# Patient Record
Sex: Female | Born: 1965 | Race: Black or African American | Hispanic: No | Marital: Married | Smoking: Never smoker
Health system: Southern US, Community
[De-identification: ages and names within clinical notes are randomized; demographics above are authoritative.]

## PROBLEM LIST (undated history)

## (undated) DIAGNOSIS — G459 Transient cerebral ischemic attack, unspecified: Secondary | ICD-10-CM

## (undated) DIAGNOSIS — H5440 Blindness, one eye, unspecified eye: Secondary | ICD-10-CM

## (undated) DIAGNOSIS — I639 Cerebral infarction, unspecified: Secondary | ICD-10-CM

## (undated) DIAGNOSIS — D860 Sarcoidosis of lung: Secondary | ICD-10-CM

## (undated) DIAGNOSIS — F32A Depression, unspecified: Secondary | ICD-10-CM

## (undated) DIAGNOSIS — F329 Major depressive disorder, single episode, unspecified: Secondary | ICD-10-CM

## (undated) DIAGNOSIS — M1990 Unspecified osteoarthritis, unspecified site: Secondary | ICD-10-CM

## (undated) DIAGNOSIS — K219 Gastro-esophageal reflux disease without esophagitis: Secondary | ICD-10-CM

## (undated) DIAGNOSIS — I10 Essential (primary) hypertension: Secondary | ICD-10-CM

## (undated) DIAGNOSIS — J189 Pneumonia, unspecified organism: Secondary | ICD-10-CM

## (undated) HISTORY — PX: EYE SURGERY: SHX253

## (undated) HISTORY — PX: FOOT SURGERY: SHX648

## (undated) HISTORY — PX: APPENDECTOMY: SHX54

## (undated) HISTORY — DX: Cerebral infarction, unspecified: I63.9

---

## 1996-09-23 HISTORY — PX: TUBAL LIGATION: SHX77

## 2004-01-12 ENCOUNTER — Emergency Department (HOSPITAL_COMMUNITY): Admission: EM | Admit: 2004-01-12 | Discharge: 2004-01-12 | Payer: Self-pay | Admitting: Emergency Medicine

## 2005-12-18 ENCOUNTER — Ambulatory Visit (HOSPITAL_BASED_OUTPATIENT_CLINIC_OR_DEPARTMENT_OTHER): Admission: RE | Admit: 2005-12-18 | Discharge: 2005-12-18 | Payer: Self-pay | Admitting: Ophthalmology

## 2008-04-25 ENCOUNTER — Observation Stay (HOSPITAL_COMMUNITY): Admission: EM | Admit: 2008-04-25 | Discharge: 2008-04-26 | Payer: Self-pay | Admitting: Emergency Medicine

## 2008-04-25 ENCOUNTER — Encounter (INDEPENDENT_AMBULATORY_CARE_PROVIDER_SITE_OTHER): Payer: Self-pay | Admitting: General Surgery

## 2011-02-05 NOTE — H&P (Signed)
NAMEKARISMA, MEISER            ACCOUNT NO.:  [id number]   MEDICAL RECORD NO.:  [id number]          PATIENT TYPE:  INP   LOCATION:  5127                         FACILITY:  [facility]   PHYSICIAN:  Ollen Gross. Vernell Morgans, M.D. DATE OF BIRTH:  [birth year]   DATE OF ADMISSION:  04/25/2008  DATE OF DISCHARGE:                              HISTORY & PHYSICAL   Ms. Zinger is a 45 year old black female who presents to the emergency  department with right lower quadrant pain that started yesterday.  Pain  is gotten more severe over the last 24 hours.  The pain has been  associated with nausea and vomiting.  She denies any fevers or chills.  No chest pain or shortness of breath.  No diarrhea or dysuria.  The rest  of her review of systems is unremarkable.   PAST MEDICAL HISTORY:  Significant for hypertension.   PAST SURGICAL HISTORY:  Significant for a C-section and left eye  surgery.   MEDICATIONS:  Lisinopril and hydrochlorothiazide.   ALLERGIES:  No known drug allergies.   SOCIAL HISTORY:  She denies any tobacco or tobacco products.   FAMILY HISTORY:  Noncontributory.   PHYSICAL EXAMINATION:  VITAL SIGNS:  Her temp is 97.7, blood pressure  103/64, and pulse 72.  GENERAL:  She is a well-developed, well-nourished black female, in no  acute distress.  SKIN:  Warm and dry.  No jaundice.  HEENT:  Eyes are anicteric.  Extraocular movements are intact.  Pupils  are equal, round, and reactive to light.  Sclerae nonicteric.  LUNGS:  Clear bilaterally with no use of accessory respiratory muscles.  HEART:  Regular rate and rhythm with impulse in the left chest.  ABDOMEN:  Soft with focal right lower quadrant tenderness with guarding.  No palpable mass or hepatosplenomegaly.  EXTREMITIES:  No cyanosis, clubbing, or edema.  Good strength in arms  and legs.  PSYCHOLOGICALLY:  She is alert and oriented x3 with no evidence today of  anxiety or depression.   On review of her lab work, is significant  for an elevated white count of  14.9.  CT scan was reviewed with the radiologist and did show an  enlarged inflamed appendix consistent with appendicitis.   ASSESSMENT AND PLAN:  This is a 45 year old black female with what  appears to be acute appendicitis.  Because of the risk of perforation  and sepsis, I believe she would benefit from appendectomy tonight.  I  have  discussed with her in detail the risks and benefits of the operation to  remove the appendix as well some other technical aspects and she  understands and wishes to proceed.  We will obtain routine preoperative  lab work and preparation in doing this for tonight.      Ollen Gross. Vernell Morgans, M.D.  Electronically Signed     PST/MEDQ  D:  04/25/2008  T:  04/26/2008  Job:  [id number]

## 2011-02-05 NOTE — Op Note (Signed)
Hannah Ellis, Hannah Ellis            ACCOUNT NO.:  [id number]   MEDICAL RECORD NO.:  [id number]          PATIENT TYPE:  INP   LOCATION:  2550                         FACILITY:  [facility]   PHYSICIAN:  Ollen Gross. Vernell Morgans, M.D. DATE OF BIRTH:  1966-05-17   DATE OF PROCEDURE:  04/25/2008  DATE OF DISCHARGE:                               OPERATIVE REPORT   PREOPERATIVE DIAGNOSIS:  Appendicitis.   POSTOPERATIVE DIAGNOSIS:  Appendicitis.   PROCEDURE:  Laparoscopic appendectomy.   SURGEON:  Ollen Gross. Vernell Morgans, MD   ANESTHESIA:  General endotracheal.   PROCEDURE:  After informed consent was obtained, the patient was brought  to the operating room and placed in supine position on the operating  room table.  After adequate induction of general anesthesia, the  patient's abdomen was prepped with Betadine and draped in usual sterile  manner.  The area below the umbilicus was infiltrated with 0.25%  Marcaine.  A small incision was made with a 15 blade knife.  This  incision was carried down through the subcutaneous tissue bluntly with a  hemostat and [employer] retractors until the linea alba was identified.  The linea alba was incised with a 15 blade knife and each side was  grasped Kocher clamps and elevated anteriorly.  The preperitoneal space  was then probed bluntly with a hemostat until the peritoneum was opened  and access was gained to the abdominal cavity.  A 0-Vicryl pursestring  stitch was placed in the fascia around the opening and Hasson cannula  was placed through the opening and anchored in place at the previous  with 0 Vicryl pursestring stitch.  The abdomen was then insufflated with  carbon dioxide without difficulty.  The patient was placed in  Trendelenburg position and rotated with the right side up.  Next, the  area above the umbilicus was infiltrated with 0.25% Marcaine.  A small  stab incision was made with 15 blade knife and a 5-mm port was placed  bluntly through this incision  into the abdominal cavity under direct  vision.  The abdomen was then inspected in the pelvis.  The uterus  appeared to have a small fibroid on the dome and the right ovary  appeared cystic with some scar tissue around the fimbria.  Pictures were  taken both of these, but there was no obvious inflammation in this area.  The right colon was then evaluated and the appendix was identified.  The  appendix did appear to be enlarged and inflamed.  We were able to  mobilize the appendix by incising its retroperitoneal attachment along  the gutter with the harmonic scalpel.  This allowed [place] to elevate the  mesoappendix.  Prior to doing this, the suprapubic area was infiltrated  with 0.25% Marcaine.  A small incision was made with a 15 blade knife  and 11-mm port was placed bluntly through this incision into the  abdominal cavity under direct vision.  Camera was then moved to the  suprapubic port.  Using the harmonic scalpel and Glassman grasper, the  appendix was then elevated.  The mesoappendix was taken down also  sharply  with the harmonic scalpel.  Once this was accomplished, we were  able to identify the base of the appendix at its junction with the cecum  and cleared of any tissue.  A laparoscopic 45 blue load six-row stapling  device was then placed through the Hasson cannula.  The appendix was  elevated.  It was placed across the base of the appendix at its junction  with the cecum clamped and fired thereby dividing the base the appendix  between staple lines.  A laparoscopic bag was then inserted through the  Hasson cannula and the appendix was placed in the bag and the bag was  sealed.  The end was then irrigated with copious amounts of saline.  The  staple line was examined and found to be hemostatic.  The bag and  appendix were removed through the infraumbilical port without  difficulty.  The fascial defect was then closed with the previously  placed Vicryl pursestring stitch as well as  with another figure-of-eight  0 Vicryl stitch.  The rest of ports were removed under direct vision and  found to be hemostatic.  The gas was allowed  to escape.  The skin incisions were all closed with interrupted 4-0  Monocryl subcuticular stitches.  Dermabond dressings were applied.  The  patient tolerated the procedure well.  At the end of the case, all  needle, sponge, and instrument counts were correct.  The patient was  then awakened and taken to recovery room in stable condition.      Ollen Gross. Vernell Morgans, M.D.  Electronically Signed     PST/MEDQ  D:  04/25/2008  T:  04/26/2008  Job:  [id number]

## 2011-02-08 NOTE — Op Note (Signed)
Hannah Ellis, Hannah Ellis            ACCOUNT NO.:  [id number]   MEDICAL RECORD NO.:  [id number]          PATIENT TYPE:  AMB   LOCATION:  [facility]                         [facility]ACILITY:  [facility]   PHYSICIAN:  Tyrone Apple. 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  A right lateral rectus recession  10 mm.   ANESTHESIA:  General with laryngeal mask airway.   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  This procedure is indicated to restore  alignment of the visual axis and improved single binocular vision.  The  risks and benefits of the procedure were  explained to the patient, prior to  procedure, informed consent was obtained.   DESCRIPTION OF TECHNIQUE:  The patient was taken to the operating room,  placed in a supine position.  The entire face was prepped and draped in the  usual sterile manner.  Our attention is first turned to the right eye.  Forced ductions were performed and found to be negative.  The globe was then  held in the inferior temporal quarter.  The eye was elevated and abducted.  An incision was made through the inferior temporal fornix, taken down to the  posterior subtenon space, and the right lateral rectus was isolated on a  Stevens hook, subsequently on a Green hook.  A second Green hook was then  passed beneath the tendon of the muscle.  Thiswas used to hold the globe in  an  elevated and abducted position.  The right lateral rectus was then  carefully dissected free from its overlying muscle  fascia and intermuscular  septae cut, and the tendon was then imbricated on 6-0 Vicryl suture taking 2  locking bites at the ends.  It was then recessed exactly 10 mm and  reattached to the globe using the preplaced sutures.  The conjunctiva was  repositioned.  Our attention is then turned to the left eye.  The globe was  held in the inferior nasal quadrant, and the eye was elevated and abducted.  An incision was made through the inferior nasal fornix, taken down to the  posterior subtenon space, and the left medial rectus muscle was then  isolated on a Stevens hook and subsequently on a Green hook.  A second Green  hook was passed beneath the tendon of the muscle, and this was used to hold  the globe in an elevated and abducted position.  Next, with careful  dissection, the left medial rectus muscle was carefully freed from its  overlying muscle fascia and intermuscular septum for a distance of  approximately 10 mm.  A second Green hook was then placed beneath the tendon  of  the left medial rectus muscle, and a mark was placed on the tendon at 8  mm from its insertion.  The tendon was then carefully imbricated on 6-0  Vicryl suture of the preplaced mark.  It was then transected proximal to the  preplaced suture, and the transected tendon was advanced to the native  insertion site with the preplaced sutures.  The muscle had been previously  resected.  It was found at approximately 7 mm from the limbus, and it was  advanced to 5.5 mm in addition to the resection.  Exuberant scar tissue was  encountered during the process of dissecting freeing the tendon, and this  was carefully dealt with.  Next, our attention was turned to the left  lateral rectus muscle.  The globe was held in the inferior temporal  quadrant, and the eye was elevated and adducted.  An incision was made  through the inferior temporal fornix, taken down to the posterior subtenons  space, and the left lateral rectus muscle  was carefully isolated on a  Stevens hook, subsequently on a Green hook.  A second Green hook was passed  beneath of the tendon of the muscle, and this was used to hole the globein  an elevatedan and adducted position.  The lateral rectus tendon was then  imbricated on 6-0 Vicryl suture, taking 2 locking bites at the end.  It was  then detached from the globe and recessed exactly 10 mm from its insertion.  It had been previously recessed, and it was advanced to its native  insertion.  It was reattached to its native insertion site with the  preplaced sutures and placed on adjustable suture fashion.  A 10 mm  recession was completed with the adjustable sutures.  The sutures were tied  securely, and the conjunctiva was repositioned.  A double pressure patch was  applied, at the conclusion of the procedure.  Antibiotic ointment was  instilled in inferior fornices of the right eye.  The patient was  subsequently adjusted to straight on the Hirschberg in the minor surgery  room, 1 hour post the completion of the procedure. There were no apparant  complications .      Casimiro Needle A. Karleen Hampshire, M.D.  Electronically Signed     MAS/MEDQ  D:  12/18/2005  T:  12/19/2005  Job:  [id number]

## 2011-06-21 LAB — BASIC METABOLIC PANEL
CO2: 29
Calcium: 9.3
Creatinine, Ser: 0.86
GFR calc Af Amer: >60
Sodium: 135

## 2011-06-21 LAB — CBC
HCT: 38.8
Hemoglobin: 13
MCHC: 33.5
MCV: 79.3
Platelets: 163
RBC: 4.89
RDW: 15.7 — ABNORMAL HIGH
WBC: 14.9 — ABNORMAL HIGH

## 2011-06-21 LAB — URINALYSIS, ROUTINE W REFLEX MICROSCOPIC
Bilirubin Urine: NEGATIVE
Hgb urine dipstick: NEGATIVE
Nitrite: NEGATIVE
Specific Gravity, Urine: 1.014
Urobilinogen, UA: 1
pH: 8

## 2011-06-21 LAB — DIFFERENTIAL
Basophils Absolute: 0.1
Basophils Relative: 0
Eosinophils Absolute: 0.1
Eosinophils Relative: 1
Lymphocytes Relative: 10 — ABNORMAL LOW
Lymphs Abs: 1.4
Monocytes Absolute: 1
Monocytes Relative: 7
Neutro Abs: 12.3 — ABNORMAL HIGH
Neutrophils Relative %: 83 — ABNORMAL HIGH

## 2011-06-21 LAB — PREGNANCY, URINE: Preg Test, Ur: NEGATIVE

## 2011-09-30 ENCOUNTER — Encounter: Payer: Self-pay | Admitting: *Deleted

## 2011-09-30 ENCOUNTER — Emergency Department (HOSPITAL_BASED_OUTPATIENT_CLINIC_OR_DEPARTMENT_OTHER)
Admission: EM | Admit: 2011-09-30 | Discharge: 2011-09-30 | Disposition: A | Discharge status: Home / Self Care | Payer: 59 | Attending: Emergency Medicine | Admitting: Emergency Medicine

## 2011-09-30 ENCOUNTER — Emergency Department (INDEPENDENT_AMBULATORY_CARE_PROVIDER_SITE_OTHER): Payer: 59

## 2011-09-30 DIAGNOSIS — R51 Headache: Secondary | ICD-10-CM

## 2011-09-30 DIAGNOSIS — I160 Hypertensive urgency: Secondary | ICD-10-CM

## 2011-09-30 DIAGNOSIS — I10 Essential (primary) hypertension: Secondary | ICD-10-CM | POA: Insufficient documentation

## 2011-09-30 HISTORY — DX: Essential (primary) hypertension: I10

## 2011-09-30 LAB — URINALYSIS, ROUTINE W REFLEX MICROSCOPIC
Bilirubin Urine: NEGATIVE
Glucose, UA: NEGATIVE mg/dL
Hgb urine dipstick: NEGATIVE
Specific Gravity, Urine: 1.018 (ref 1.005–1.030)
Urobilinogen, UA: 0.2 mg/dL (ref 0.0–1.0)
pH: 7 (ref 5.0–8.0)

## 2011-09-30 MED ORDER — AMLODIPINE BESYLATE 10 MG PO TABS: 10.0000 mg | ORAL_TABLET | Freq: Every day | ORAL | Status: DC

## 2011-09-30 MED ORDER — LISINOPRIL-HYDROCHLOROTHIAZIDE 10-12.5 MG PO TABS: 2.0000 | ORAL_TABLET | Freq: Every day | ORAL | Status: DC

## 2011-09-30 MED ORDER — METOPROLOL TARTRATE 50 MG PO TABS
50.0000 mg | ORAL_TABLET | Freq: Once | ORAL | Status: AC
  Administered 2011-09-30: 50 mg via ORAL

## 2011-09-30 MED ORDER — METOPROLOL TARTRATE 50 MG PO TABS: 25.0000 mg | ORAL_TABLET | Freq: Two times a day (BID) | ORAL | Status: DC

## 2011-09-30 MED ORDER — KETOROLAC TROMETHAMINE 60 MG/2ML IM SOLN
60.0000 mg | Freq: Once | INTRAMUSCULAR | Status: AC
  Administered 2011-09-30: 60 mg via INTRAMUSCULAR
  Filled 2011-09-30: qty 2

## 2011-09-30 MED ORDER — LABETALOL HCL 100 MG PO TABS
100.0000 mg | ORAL_TABLET | Freq: Once | ORAL | Status: DC
  Filled 2011-09-30: qty 1

## 2011-09-30 MED ORDER — METOPROLOL TARTRATE 50 MG PO TABS
ORAL_TABLET | ORAL | Status: AC
  Administered 2011-09-30: 50 mg via ORAL
  Filled 2011-09-30: qty 1

## 2011-09-30 NOTE — ED Notes (Signed)
Pt ambulated to restroom and back to stretcher without difficulty, steady gait observed.

## 2011-09-30 NOTE — ED Provider Notes (Signed)
History   This chart was scribed for Geoffery Lyons, MD by Melba Coon. The patient was seen in room MH04/MH04 and the patient's care was started at 4:30PM.    CSN: [id number]  Arrival date & time 09/30/11  1512   First MD Initiated Contact with Patient 09/30/11 1634      Chief Complaint  Patient presents with  . Headache    (Consider location/radiation/quality/duration/timing/severity/associated sxs/prior treatment) HPI Hannah Ellis is a 46 y.o. female who presents to the Emergency Department complaining of constant, moderate to severe HA with associated HTN with an onset 2-3 days ago. 2-3 days ago, pt stopped taking HTN meds and pain started. Pain was at its worst yesterday and prompted pt to present to the hospital 2 hrs ago. HA frontal and posterior. Dr told pt to come to ED when high BP was noticed (230/130). Prior to PE, BP was 164/112. Pt is similar to light. No fever, n/v/d, or trouble with eyesight. Pt states she had previous similar episode 14 yrs ago.   Past Medical History  Diagnosis Date  . Hypertension     Past Surgical History  Procedure Date  . Eye surgery   . Foot surgery   . Appendectomy   . Tubal ligaton     No family history on file.  History  Substance Use Topics  . Smoking status: Never Smoker   . Smokeless tobacco: Not on file  . Alcohol Use: No    OB History    Grav Para Term Preterm Abortions TAB SAB Ect Mult Living                  Review of Systems 10 Systems reviewed and are negative for acute change except as noted in the HPI.  Allergies  Review of patient's allergies indicates no known allergies.  Home Medications   Current Outpatient Rx  Name Route Sig Dispense Refill  . AMLODIPINE BESYLATE 10 MG PO TABS Oral Take 10 mg by mouth daily.      Marland Kitchen LISINOPRIL-HYDROCHLOROTHIAZIDE 20-25 MG PO TABS Oral Take 1 tablet by mouth daily.      Marland Kitchen METOPROLOL TARTRATE 25 MG PO TABS Oral Take 25 mg by mouth 2 (two) times daily.        BP  130/82  Pulse 65  Temp(Src) 97.9 F (36.6 C) (Oral)  Resp 16  SpO2 100%  Physical Exam  Nursing note and vitals reviewed. Constitutional: She is oriented to person, place, and time. She appears well-developed and well-nourished. No distress.  HENT:  Head: Normocephalic and atraumatic.  Eyes: Conjunctivae and EOM are normal. Pupils are equal, round, and reactive to light.  Neck: Normal range of motion. Neck supple. No tracheal deviation present.  Cardiovascular: Normal rate, regular rhythm and normal heart sounds.  Exam reveals no gallop and no friction rub.   No murmur heard. Pulmonary/Chest: Effort normal and breath sounds normal. No respiratory distress. She has no wheezes. She has no rales.  Abdominal: Soft. She exhibits no distension.  Musculoskeletal: Normal range of motion. She exhibits no edema.  Neurological: She is alert and oriented to person, place, and time. No sensory deficit.  Skin: Skin is warm and dry. No rash noted.  Psychiatric: She has a normal mood and affect. Her behavior is normal.    ED Course  Procedures (including critical care time)  DIAGNOSTIC STUDIES: Oxygen Saturation is 100% on room air, normal by my interpretation.    COORDINATION OF CARE:     Labs  Reviewed  URINALYSIS, ROUTINE W REFLEX MICROSCOPIC - Abnormal; Notable for the following:    Ketones, ur 15 (*)    All other components within normal limits   Ct Head Wo Contrast  09/30/2011  *RADIOLOGY REPORT*  Clinical Data: Headache  CT HEAD WITHOUT CONTRAST  Technique:  Contiguous axial images were obtained from the base of the skull through the vertex without contrast.  Comparison: None  Findings: No acute intracranial hemorrhage.  No focal mass lesion. No CT evidence of acute infarction.   No midline shift or mass effect.  No hydrocephalus.  Basilar cisterns are patent. Paranasal sinuses and mastoid air cells are clear.  Orbits are normal.  IMPRESSION: No acute intracranial findings.  Original  Report Authenticated By: Genevive Bi, M.D.     No diagnosis found.    MDM  Patient feeling better with meds, ct is okay.  BP is much improved.  Will discharge to home.    I personally performed the services described in this documentation, which was scribed in my presence. The recorded information has been reviewed and considered.         Geoffery Lyons, MD 09/30/11 [phone]

## 2011-09-30 NOTE — ED Notes (Signed)
To Ed via ems c.o headache and high bp. Has not been taking her BP medication over the weekend. Planned to see her MD this afternoon for BP medication refil but the head pain got to bad to wait.

## 2014-11-15 ENCOUNTER — Emergency Department (HOSPITAL_COMMUNITY): Payer: BLUE CROSS/BLUE SHIELD

## 2014-11-15 ENCOUNTER — Encounter (HOSPITAL_COMMUNITY): Payer: Self-pay

## 2014-11-15 ENCOUNTER — Emergency Department (HOSPITAL_COMMUNITY)
Admission: EM | Admit: 2014-11-15 | Discharge: 2014-11-15 | Disposition: A | Discharge status: Home / Self Care | Payer: BLUE CROSS/BLUE SHIELD | Attending: Emergency Medicine | Admitting: Emergency Medicine

## 2014-11-15 DIAGNOSIS — I10 Essential (primary) hypertension: Secondary | ICD-10-CM | POA: Insufficient documentation

## 2014-11-15 DIAGNOSIS — R079 Chest pain, unspecified: Secondary | ICD-10-CM | POA: Diagnosis present

## 2014-11-15 DIAGNOSIS — Z79899 Other long term (current) drug therapy: Secondary | ICD-10-CM | POA: Diagnosis not present

## 2014-11-15 LAB — CBC WITH DIFFERENTIAL/PLATELET
BASOS ABS: 0 10*3/uL (ref 0.0–0.1)
BASOS PCT: 0 % (ref 0–1)
Eosinophils Absolute: 0.1 10*3/uL (ref 0.0–0.7)
Eosinophils Relative: 1 % (ref 0–5)
HCT: 33.7 % — ABNORMAL LOW (ref 36.0–46.0)
HEMOGLOBIN: 10.4 g/dL — AB (ref 12.0–15.0)
LYMPHS PCT: 32 % (ref 12–46)
Lymphs Abs: 2 10*3/uL (ref 0.7–4.0)
MCH: 22.9 pg — ABNORMAL LOW (ref 26.0–34.0)
MCHC: 30.9 g/dL (ref 30.0–36.0)
MCV: 74.1 fL — ABNORMAL LOW (ref 78.0–100.0)
MONOS PCT: 4 % (ref 3–12)
Monocytes Absolute: 0.2 10*3/uL (ref 0.1–1.0)
NEUTROS ABS: 3.9 10*3/uL (ref 1.7–7.7)
NEUTROS PCT: 63 % (ref 43–77)
Platelets: 196 10*3/uL (ref 150–400)
RBC: 4.55 MIL/uL (ref 3.87–5.11)
RDW: 17.3 % — AB (ref 11.5–15.5)
WBC: 6.2 10*3/uL (ref 4.0–10.5)

## 2014-11-15 LAB — BASIC METABOLIC PANEL
Anion gap: 4 — ABNORMAL LOW (ref 5–15)
BUN: 9 mg/dL (ref 6–23)
CO2: 27 mmol/L (ref 19–32)
CREATININE: 0.75 mg/dL (ref 0.50–1.10)
Calcium: 8.7 mg/dL (ref 8.4–10.5)
Chloride: 105 mmol/L (ref 96–112)
GLUCOSE: 94 mg/dL (ref 70–99)
POTASSIUM: 3.5 mmol/L (ref 3.5–5.1)
Sodium: 136 mmol/L (ref 135–145)

## 2014-11-15 LAB — I-STAT TROPONIN, ED: TROPONIN I, POC: 0 ng/mL (ref 0.00–0.08)

## 2014-11-15 MED ORDER — OMEPRAZOLE 20 MG PO CPDR: 20.0000 mg | DELAYED_RELEASE_CAPSULE | Freq: Every day | ORAL | Status: DC

## 2014-11-15 MED ORDER — METOPROLOL TARTRATE 25 MG PO TABS: 25.0000 mg | ORAL_TABLET | Freq: Two times a day (BID) | ORAL | Status: DC

## 2014-11-15 MED ORDER — NAPROXEN 500 MG PO TABS: 500.0000 mg | ORAL_TABLET | Freq: Two times a day (BID) | ORAL | Status: DC

## 2014-11-15 MED ORDER — LISINOPRIL-HYDROCHLOROTHIAZIDE 20-25 MG PO TABS: 1.0000 | ORAL_TABLET | Freq: Every day | ORAL | Status: DC

## 2014-11-15 MED ORDER — GI COCKTAIL ~~LOC~~
30.0000 mL | Freq: Once | ORAL | Status: AC
  Administered 2014-11-15: 30 mL via ORAL
  Filled 2014-11-15: qty 30

## 2014-11-15 MED ORDER — MORPHINE SULFATE 4 MG/ML IJ SOLN
4.0000 mg | Freq: Once | INTRAMUSCULAR | Status: AC
  Administered 2014-11-15: 4 mg via INTRAVENOUS
  Filled 2014-11-15: qty 1

## 2014-11-15 MED ORDER — AMLODIPINE BESYLATE 10 MG PO TABS: 10.0000 mg | ORAL_TABLET | Freq: Every day | ORAL | Status: DC

## 2014-11-15 NOTE — Discharge Instructions (Signed)
Please follow the directions provided. Be sure to follow-up with your primary care doctor to ensure you're getting better. Please restart her blood pressure medicines as directed. Please also start taking the acid reducing medicine, omeprazole. You may take Tylenol or naproxen for this chest pain. Don't hesitate to return for any new, worsening, or concerning symptoms.  SEEK IMMEDIATE MEDICAL CARE IF:  You have increased chest pain or pain that spreads to your arm, neck, jaw, back, or abdomen.  You have shortness of breath.  You have an increasing cough, or you cough up blood.  You have severe back or abdominal pain.  You feel nauseous or vomit.  You have severe weakness.  You faint.  You have chills. This is an emergency. Do not wait to see if the pain will go away. Get medical help at once. Call your local emergency services (911 in [place] Do not drive yourself to the hospital.

## 2014-11-15 NOTE — ED Provider Notes (Signed)
CSN: [id number]     Arrival date & time 11/15/14  1555 History   First MD Initiated Contact with Patient 11/15/14 1559     Chief Complaint  Patient presents with  . Chest Pain   (Consider location/radiation/quality/duration/timing/severity/associated sxs/prior Treatment) HPI Hannah Ellis is a 49-year-old female presenting with sharp chest pain. She states this pain first started last night around 8:00 when she laid down to go to bed. She has the pain was sharp and in the center of her chest. The pain did not radiate anywhere and she was able to follow sleep. She says the pain was gone when she woke up. The pain began again today at noon while she was sitting in class. She noticed the pain was sharp and again in the center of her chest, and did not radiate anywhere. She went to [facility], where they noted she had high blood pressure gave her aspirin and nitroglycerin and center to the ER. She states she is supposed to be on 3 blood pressure medicines but has not taken for at least 3 months, since before Thanksgiving. She denies any shortness of breath, any nausea, vomiting, abdominal pain, back pain, or diaphoresis.   Past Medical History  Diagnosis Date  . Hypertension    Past Surgical History  Procedure Laterality Date  . Eye surgery    . Foot surgery    . Appendectomy    . Tubal ligaton     No family history on file. History  Substance Use Topics  . Smoking status: Never Smoker   . Smokeless tobacco: Not on file  . Alcohol Use: No   OB History    No data available     Review of Systems  Constitutional: Negative for fever and chills.  HENT: Negative for sore throat.   Eyes: Negative for visual disturbance.  Respiratory: Negative for cough and shortness of breath.   Cardiovascular: Positive for chest pain. Negative for leg swelling.  Gastrointestinal: Negative for nausea, vomiting and diarrhea.  Genitourinary: Negative for dysuria.  Musculoskeletal: Negative for  myalgias.  Skin: Negative for rash.  Neurological: Negative for weakness, numbness and headaches.     Allergies  Review of patient's allergies indicates no known allergies.  Home Medications   Prior to Admission medications   Medication Sig Start Date End Date Taking? Authorizing Provider  amLODipine (NORVASC) 10 MG tablet Take 10 mg by mouth daily.      Historical Provider, MD  amLODipine (NORVASC) 10 MG tablet Take 1 tablet (10 mg total) by mouth daily. 09/30/11 09/29/12  Geoffery Lyons, MD  lisinopril-hydrochlorothiazide (PRINZIDE,ZESTORETIC) 20-25 MG per tablet Take 1 tablet by mouth daily.      Historical Provider, MD  lisinopril-hydrochlorothiazide (ZESTORETIC) 10-12.5 MG per tablet Take 2 tablets by mouth daily. 09/30/11 09/29/12  Geoffery Lyons, MD  metoprolol (LOPRESSOR) 50 MG tablet Take 0.5 tablets (25 mg total) by mouth 2 (two) times daily. 09/30/11 09/29/12  Geoffery Lyons, MD  metoprolol tartrate (LOPRESSOR) 25 MG tablet Take 25 mg by mouth 2 (two) times daily.      Historical Provider, MD   BP 210/113 mmHg  Pulse 76  Temp(Src) 98.6 F (37 C) (Oral)  Resp 26  SpO2 99%  LMP 11/10/2014 Physical Exam  Constitutional: She appears well-developed and well-nourished. No distress.  HENT:  Head: Normocephalic and atraumatic.  Mouth/Throat: Oropharynx is clear and moist. No oropharyngeal exudate.  Eyes: Conjunctivae are normal.  Neck: Neck supple. No thyromegaly present.  Cardiovascular: Normal rate, regular  rhythm and intact distal pulses.   Pulmonary/Chest: Effort normal and breath sounds normal. No respiratory distress. She has no wheezes. She has no rales. She exhibits tenderness.    Abdominal: Soft. There is no tenderness.  Musculoskeletal: She exhibits no tenderness.  Lymphadenopathy:    She has no cervical adenopathy.  Neurological: She is alert.  Skin: Skin is warm and dry. No rash noted. She is not diaphoretic.  Psychiatric: She has a normal mood and affect.  Nursing note and  vitals reviewed.   ED Course  Procedures (including critical care time) Labs Review Labs Reviewed  CBC WITH DIFFERENTIAL/PLATELET - Abnormal; Notable for the following:    Hemoglobin 10.4 (*)    HCT 33.7 (*)    MCV 74.1 (*)    MCH 22.9 (*)    RDW 17.3 (*)    All other components within normal limits  BASIC METABOLIC PANEL - Abnormal; Notable for the following:    Anion gap 4 (*)    All other components within normal limits  I-STAT TROPOININ, ED    Imaging Review Dg Chest 2 View  11/15/2014   CLINICAL DATA:  Centralized chest pain and weakness today.  EXAM: CHEST  2 VIEW  COMPARISON:  None.  FINDINGS: Normal heart size and pulmonary vascularity. Slightly shallow inspiration. Suggestion of a small focal area of infiltration in the superior segment of the right lower lung. This may indicate early changes of focal pneumonia. Left lung is clear. No blunting of costophrenic angles. No pneumothorax.  IMPRESSION: Focal airspace disease in the superior segment right lower lung suggesting early pneumonia.   Electronically Signed   By: Burman NievesWilliam  Stevens M.D.   On: 11/15/2014 17:07     EKG Interpretation   Date/Time:  Tuesday November 15 2014 16:08:26 EST Ventricular Rate:  73 PR Interval:  185 QRS Duration: 115 QT Interval:  451 QTC Calculation: 497 R Axis:   54 Text Interpretation:  Sinus rhythm Probable left atrial enlargement  Incomplete right bundle branch block Nonspecific T abnormalities, lateral  leads Artifact No previous ECGs available Confirmed by RANCOUR  MD,  STEPHEN [phone]([id number]) on 11/15/2014 4:10:42 PM      MDM   Final diagnoses:  Chest pain, unspecified chest pain type   49 yo with reproducible chest pain. Pt is hypertensive but has not taken he bp meds x several months.  Labs and EKG reviewed and not concerning for cause of her pain. CXR shows possible early pneumonia. Pain improved in the ED. Discussed starting PPI and anti-inflammatory meds at home.  Prescription  provided to re-start her bp meds. Resources provided to follow-up with PCP.  Pt has been advised to  return to the ED if CP becomes exertional, associated with diaphoresis or nausea, radiates to left jaw/arm, worsens or becomes concerning in any way. Pt appears reliable for follow up and is agreeable to discharge. Case has been discussed with and seen by Dr. Manus Gunningancour who agrees with the above plan to discharge.    Prescription for antibiotic not provided with discharge instructions. Prescription for azithromycin called in to pt's pharmacy and pt called to make aware of medication regimen.   Filed Vitals:   11/15/14 1815 11/15/14 1832 11/15/14 1835 11/15/14 1837  BP: 178/93 189/100 185/98 185/98  Pulse:   72   Temp:      TempSrc:      Resp:   17   SpO2:   99%    Meds given in ED:  Medications  gi  cocktail (Maalox,Lidocaine,Donnatal) (30 mLs Oral Given 11/15/14 1640)  morphine 4 MG/ML injection 4 mg (4 mg Intravenous Given 11/15/14 1640)    Discharge Medication List as of 11/15/2014  6:35 PM    START taking these medications   Details  naproxen (NAPROSYN) 500 MG tablet Take 1 tablet (500 mg total) by mouth 2 (two) times daily., Starting 11/15/2014, Until Discontinued, Print    omeprazole (PRILOSEC) 20 MG capsule Take 1 capsule (20 mg total) by mouth daily., Starting 11/15/2014, Until Discontinued, Print           Harle Battiest, NP 11/17/14 0321  Glynn Octave, MD 11/17/14 [phone]

## 2014-11-15 NOTE — ED Notes (Signed)
Pt. Is from home. Had sudden onset of mid sternal, sharp CP starting last PM around 8. Pain woke pt. Up from sleep throughout night, but had resolved this AM. Went to [facility] today around 12 pm when pain began again. [facility] administered 1 ASA, EMS administered 3 additional ASA and 2 Nitro. Pt. Has no additional symptoms. Pt. Has hx of HTN, 184/112 today, has not had BP meds since Saturday.

## 2014-11-15 NOTE — ED Notes (Signed)
Patient transported to X-ray 

## 2015-05-09 ENCOUNTER — Emergency Department (HOSPITAL_COMMUNITY): Payer: BLUE CROSS/BLUE SHIELD

## 2015-05-09 ENCOUNTER — Inpatient Hospital Stay (HOSPITAL_COMMUNITY): Payer: BLUE CROSS/BLUE SHIELD

## 2015-05-09 ENCOUNTER — Encounter (HOSPITAL_COMMUNITY): Payer: Self-pay | Admitting: Cardiology

## 2015-05-09 ENCOUNTER — Observation Stay (HOSPITAL_COMMUNITY)
Admission: EM | Admit: 2015-05-09 | Discharge: 2015-05-11 | Disposition: A | Discharge status: Home / Self Care | Payer: BLUE CROSS/BLUE SHIELD | Attending: Internal Medicine | Admitting: Internal Medicine

## 2015-05-09 DIAGNOSIS — Z6833 Body mass index (BMI) 33.0-33.9, adult: Secondary | ICD-10-CM | POA: Diagnosis not present

## 2015-05-09 DIAGNOSIS — Z9114 Patient's other noncompliance with medication regimen: Secondary | ICD-10-CM | POA: Insufficient documentation

## 2015-05-09 DIAGNOSIS — Z9119 Patient's noncompliance with other medical treatment and regimen: Secondary | ICD-10-CM

## 2015-05-09 DIAGNOSIS — D509 Iron deficiency anemia, unspecified: Secondary | ICD-10-CM | POA: Diagnosis present

## 2015-05-09 DIAGNOSIS — I639 Cerebral infarction, unspecified: Secondary | ICD-10-CM

## 2015-05-09 DIAGNOSIS — Z91199 Patient's noncompliance with other medical treatment and regimen due to unspecified reason: Secondary | ICD-10-CM

## 2015-05-09 DIAGNOSIS — R4781 Slurred speech: Secondary | ICD-10-CM | POA: Diagnosis present

## 2015-05-09 DIAGNOSIS — I10 Essential (primary) hypertension: Secondary | ICD-10-CM | POA: Diagnosis not present

## 2015-05-09 DIAGNOSIS — E669 Obesity, unspecified: Secondary | ICD-10-CM | POA: Insufficient documentation

## 2015-05-09 DIAGNOSIS — G459 Transient cerebral ischemic attack, unspecified: Principal | ICD-10-CM | POA: Diagnosis present

## 2015-05-09 DIAGNOSIS — Z79899 Other long term (current) drug therapy: Secondary | ICD-10-CM | POA: Diagnosis not present

## 2015-05-09 HISTORY — DX: Depression, unspecified: F32.A

## 2015-05-09 HISTORY — DX: Transient cerebral ischemic attack, unspecified: G45.9

## 2015-05-09 HISTORY — DX: Major depressive disorder, single episode, unspecified: F32.9

## 2015-05-09 HISTORY — DX: Cerebral infarction, unspecified: I63.9

## 2015-05-09 HISTORY — DX: Gastro-esophageal reflux disease without esophagitis: K21.9

## 2015-05-09 LAB — I-STAT CHEM 8, ED
BUN: 9 mg/dL (ref 6–20)
CALCIUM ION: 1.2 mmol/L (ref 1.12–1.23)
Chloride: 104 mmol/L (ref 101–111)
Creatinine, Ser: 0.8 mg/dL (ref 0.44–1.00)
Glucose, Bld: 105 mg/dL — ABNORMAL HIGH (ref 65–99)
HCT: 38 % (ref 36.0–46.0)
HEMOGLOBIN: 12.9 g/dL (ref 12.0–15.0)
Potassium: 3.8 mmol/L (ref 3.5–5.1)
SODIUM: 138 mmol/L (ref 135–145)
TCO2: 25 mmol/L (ref 0–100)

## 2015-05-09 LAB — COMPREHENSIVE METABOLIC PANEL
ALK PHOS: 60 U/L (ref 38–126)
ALT: 14 U/L (ref 14–54)
ANION GAP: 6 (ref 5–15)
AST: 19 U/L (ref 15–41)
Albumin: 3.6 g/dL (ref 3.5–5.0)
BUN: 8 mg/dL (ref 6–20)
CO2: 25 mmol/L (ref 22–32)
Calcium: 9.1 mg/dL (ref 8.9–10.3)
Chloride: 107 mmol/L (ref 101–111)
Creatinine, Ser: 0.85 mg/dL (ref 0.44–1.00)
GFR calc Af Amer: >60 mL/min (ref >60)
GFR calc non Af Amer: >60 mL/min (ref >60)
Glucose, Bld: 107 mg/dL — ABNORMAL HIGH (ref 65–99)
POTASSIUM: 3.8 mmol/L (ref 3.5–5.1)
SODIUM: 138 mmol/L (ref 135–145)
Total Bilirubin: 0.3 mg/dL (ref 0.3–1.2)
Total Protein: 6.6 g/dL (ref 6.5–8.1)

## 2015-05-09 LAB — URINALYSIS, ROUTINE W REFLEX MICROSCOPIC
Bilirubin Urine: NEGATIVE
Glucose, UA: NEGATIVE mg/dL
HGB URINE DIPSTICK: NEGATIVE
KETONES UR: NEGATIVE mg/dL
LEUKOCYTES UA: NEGATIVE
Nitrite: NEGATIVE
PROTEIN: NEGATIVE mg/dL
Specific Gravity, Urine: 1.01 (ref 1.005–1.030)
Urobilinogen, UA: 0.2 mg/dL (ref 0.0–1.0)
pH: 6.5 (ref 5.0–8.0)

## 2015-05-09 LAB — CBC
HCT: 35.5 % — ABNORMAL LOW (ref 36.0–46.0)
Hemoglobin: 11 g/dL — ABNORMAL LOW (ref 12.0–15.0)
MCH: 22.9 pg — ABNORMAL LOW (ref 26.0–34.0)
MCHC: 31 g/dL (ref 30.0–36.0)
MCV: 74 fL — AB (ref 78.0–100.0)
PLATELETS: 178 10*3/uL (ref 150–400)
RBC: 4.8 MIL/uL (ref 3.87–5.11)
RDW: 17.1 % — AB (ref 11.5–15.5)
WBC: 6.9 10*3/uL (ref 4.0–10.5)

## 2015-05-09 LAB — DIFFERENTIAL
Basophils Absolute: 0 10*3/uL (ref 0.0–0.1)
Basophils Relative: 0 % (ref 0–1)
EOS ABS: 0.1 10*3/uL (ref 0.0–0.7)
EOS PCT: 2 % (ref 0–5)
Lymphocytes Relative: 25 % (ref 12–46)
Lymphs Abs: 1.7 10*3/uL (ref 0.7–4.0)
MONO ABS: 0.3 10*3/uL (ref 0.1–1.0)
Monocytes Relative: 5 % (ref 3–12)
Neutro Abs: 4.7 10*3/uL (ref 1.7–7.7)
Neutrophils Relative %: 68 % (ref 43–77)

## 2015-05-09 LAB — PROTIME-INR
INR: 1.05 (ref 0.00–1.49)
PROTHROMBIN TIME: 13.9 s (ref 11.6–15.2)

## 2015-05-09 LAB — RAPID URINE DRUG SCREEN, HOSP PERFORMED
AMPHETAMINES: NOT DETECTED
Barbiturates: NOT DETECTED
Benzodiazepines: NOT DETECTED
Cocaine: NOT DETECTED
Opiates: NOT DETECTED
TETRAHYDROCANNABINOL: NOT DETECTED

## 2015-05-09 LAB — ETHANOL: Alcohol, Ethyl (B): <5 mg/dL (ref <5)

## 2015-05-09 LAB — I-STAT TROPONIN, ED: Troponin i, poc: 0 ng/mL (ref 0.00–0.08)

## 2015-05-09 LAB — APTT: aPTT: 31 seconds (ref 24–37)

## 2015-05-09 LAB — CBG MONITORING, ED: GLUCOSE-CAPILLARY: 101 mg/dL — AB (ref 65–99)

## 2015-05-09 MED ORDER — ENOXAPARIN SODIUM 40 MG/0.4ML ~~LOC~~ SOLN
40.0000 mg | SUBCUTANEOUS | Status: DC
  Administered 2015-05-09 – 2015-05-10 (×2): 40 mg via SUBCUTANEOUS
  Filled 2015-05-09 (×2): qty 0.4

## 2015-05-09 MED ORDER — STROKE: EARLY STAGES OF RECOVERY BOOK
Freq: Once | Status: AC
  Administered 2015-05-09: 18:00:00

## 2015-05-09 MED ORDER — ASPIRIN EC 325 MG PO TBEC
325.0000 mg | DELAYED_RELEASE_TABLET | Freq: Every day | ORAL | Status: DC
  Administered 2015-05-10 – 2015-05-11 (×2): 325 mg via ORAL
  Filled 2015-05-09 (×2): qty 1

## 2015-05-09 MED ORDER — METOPROLOL TARTRATE 25 MG PO TABS
25.0000 mg | ORAL_TABLET | Freq: Two times a day (BID) | ORAL | Status: DC
  Administered 2015-05-09 – 2015-05-11 (×4): 25 mg via ORAL
  Filled 2015-05-09 (×4): qty 1

## 2015-05-09 MED ORDER — LISINOPRIL 20 MG PO TABS
20.0000 mg | ORAL_TABLET | Freq: Every day | ORAL | Status: DC
  Administered 2015-05-09 – 2015-05-10 (×2): 20 mg via ORAL
  Filled 2015-05-09 (×2): qty 1

## 2015-05-09 MED ORDER — LISINOPRIL-HYDROCHLOROTHIAZIDE 20-25 MG PO TABS
1.0000 | ORAL_TABLET | Freq: Every day | ORAL | Status: DC
  Filled 2015-05-09: qty 1

## 2015-05-09 MED ORDER — HYDROCHLOROTHIAZIDE 25 MG PO TABS
25.0000 mg | ORAL_TABLET | Freq: Every day | ORAL | Status: DC
  Administered 2015-05-09 – 2015-05-10 (×2): 25 mg via ORAL
  Filled 2015-05-09 (×2): qty 1

## 2015-05-09 MED ORDER — AMLODIPINE BESYLATE 10 MG PO TABS
10.0000 mg | ORAL_TABLET | Freq: Every day | ORAL | Status: DC
  Administered 2015-05-09 – 2015-05-10 (×2): 10 mg via ORAL
  Filled 2015-05-09: qty 2
  Filled 2015-05-09: qty 1

## 2015-05-09 NOTE — Code Documentation (Signed)
49yo female arriving to [facility] via [facility] at 1030.  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  EMS called and activated a Code Stroke.  Stroke Team at the bedside on arrival.  Labs drawn and patient cleared by Dr. Patria Mane.  Patient to CT.  NIHSS 0, see documentation for details and code stroke times.  Patient feels her speech is normal, but she reports left mouth pain which she later describes it as numbness and tingling.  Of note, patient with h/o HTN and has not taken her antihypertensives in a month.  Dr. Thad Ranger to the bedside.  Patient is too mild to treat at this time, however, remains in the window to treat with tPA until 1330 should symptoms worsen.  Bedside handoff with ED RN Lillia Abed.

## 2015-05-09 NOTE — Consult Note (Signed)
Referring Physician: Adela Lank    Chief Complaint:  Left facial numbness and dysarthria  HPI:                                                                                                                                         Hannah Ellis is an 49 y.o. female who was brought to hospital secondary to sudden onset of slurred speech and left facial numbness at 0900.  Patient states the slurred speech lasted for about one hour and then resolved. She still notes she has some tingling in the corner of her left mouth.  No other symptoms noted. She denies HA or previous CVA. She does have elevated BP 197/112 but states this has been elevated for multiple weeks and has not seen her PCP "in a while".  She denies taking any antiplatelet.   OF note: she has been under significant stress lately and feels this has effected her BP.  Date last known well: Date: 05/09/2015 Time last known well: Time: 09:00 tPA Given: No: minimal symptoms Modified Rankin: Rankin Score=0    Past Medical History  Diagnosis Date  . Hypertension     Past Surgical History  Procedure Laterality Date  . Eye surgery    . Foot surgery    . Appendectomy    . Tubal ligaton      Family History  Problem Relation Age of Onset  . Hypertension Mother   . Hypertension Father    Social History:  reports that she has never smoked. She does not have any smokeless tobacco history on file. She reports that she does not drink alcohol or use illicit drugs.  Allergies: No Known Allergies  Medications:                                                                                                                           No current facility-administered medications for this encounter.   Current Outpatient Prescriptions  Medication Sig Dispense Refill  . amLODipine (NORVASC) 10 MG tablet Take 1 tablet (10 mg total) by mouth daily. 30 tablet 0  . lisinopril-hydrochlorothiazide (PRINZIDE,ZESTORETIC) 20-25 MG per tablet Take 1  tablet by mouth daily. 30 tablet 0  . metoprolol tartrate (LOPRESSOR) 25 MG tablet Take 1 tablet (25 mg total) by mouth 2 (two) times daily. 30 tablet 0  . omeprazole (PRILOSEC) 20 MG capsule Take  1 capsule (20 mg total) by mouth daily. (Patient not taking: Reported on 05/09/2015) 30 capsule 1     ROS:                                                                                                                                       History obtained from the patient  General ROS: negative for - chills, fatigue, fever, night sweats, weight gain or weight loss Psychological ROS: negative for - behavioral disorder, hallucinations, memory difficulties, mood swings or suicidal ideation Ophthalmic ROS: negative for - blurry vision, double vision, eye pain or loss of vision ENT ROS: negative for - epistaxis, nasal discharge, oral lesions, sore throat, tinnitus or vertigo Allergy and Immunology ROS: negative for - hives or itchy/watery eyes Hematological and Lymphatic ROS: negative for - bleeding problems, bruising or swollen lymph nodes Endocrine ROS: negative for - galactorrhea, hair pattern changes, polydipsia/polyuria or temperature intolerance Respiratory ROS: negative for - cough, hemoptysis, shortness of breath or wheezing Cardiovascular ROS: negative for - chest pain, dyspnea on exertion, edema or irregular heartbeat Gastrointestinal ROS: negative for - abdominal pain, diarrhea, hematemesis, nausea/vomiting or stool incontinence Genito-Urinary ROS: negative for - dysuria, hematuria, incontinence or urinary frequency/urgency Musculoskeletal ROS: negative for - joint swelling or muscular weakness Neurological ROS: as noted in HPI Dermatological ROS: negative for rash and skin lesion changes  Neurologic Examination:                                                                                                      Blood pressure 197/112, pulse 72, temperature 98.3 F (36.8 C), temperature  source Oral, resp. rate 16, height  (1.626 m), weight 89.8 kg (197 lb 15.6 oz), last menstrual period 04/10/2015, SpO2 100 %.  HEENT-  Normocephalic, no lesions, without obvious abnormality.  Normal external eye and conjunctiva.  Normal TM's bilaterally.  Normal auditory canals and external ears. Normal external nose, mucus membranes and septum.  Normal pharynx. Cardiovascular- S1, S2 normal, pulses palpable throughout   Lungs- chest clear, no wheezing, rales, normal symmetric air entry Abdomen- normal findings: bowel sounds normal Extremities- no edema Lymph-no adenopathy palpable Musculoskeletal-no joint tenderness, deformity or swelling Skin-warm and dry, no hyperpigmentation, vitiligo, or suspicious lesions  Neurological Examination Mental Status: Alert, oriented, thought content appropriate.  Speech fluent without evidence of aphasia.  Able to follow 3 step commands without difficulty. Cranial Nerves: II: Discs flat bilaterally; Visual fields grossly normal, pupils equal, round, reactive to light and  accommodation III,IV, VI: ptosis not present, extra-ocular motions intact bilaterally V,VII: smile symmetric, facial light touch sensation decreased in the left corner of her mouth.  VIII: hearing normal bilaterally IX,X: uvula rises symmetrically XI: bilateral shoulder shrug XII: midline tongue extension Motor: Right : Upper extremity   5/5    Left:     Upper extremity   5/5  Lower extremity   5/5     Lower extremity   5/5 Tone and bulk:normal tone throughout; no atrophy noted Sensory: Pinprick and light touch intact throughout, bilaterally Deep Tendon Reflexes: 2+ and symmetric throughout Plantars: Right: downgoing   Left: downgoing Cerebellar: normal finger-to-nose  and normal heel-to-shin test Gait: not tested due to multiple leads.     Lab Results: Basic Metabolic Panel: No results for input(s): NA, K, CL, CO2, GLUCOSE, BUN, CREATININE, CALCIUM, MG, PHOS in the last  168 hours.  Liver Function Tests: No results for input(s): AST, ALT, ALKPHOS, BILITOT, PROT, ALBUMIN in the last 168 hours. No results for input(s): LIPASE, AMYLASE in the last 168 hours. No results for input(s): AMMONIA in the last 168 hours.  CBC: No results for input(s): WBC, NEUTROABS, HGB, HCT, MCV, PLT in the last 168 hours.  Cardiac Enzymes: No results for input(s): CKTOTAL, CKMB, CKMBINDEX, TROPONINI in the last 168 hours.  Lipid Panel: No results for input(s): CHOL, TRIG, HDL, CHOLHDL, VLDL, LDLCALC in the last 168 hours.  CBG: No results for input(s): GLUCAP in the last 168 hours.  Microbiology: No results found for this or any previous visit.  Coagulation Studies: No results for input(s): LABPROT, INR in the last 72 hours.  Imaging: Ct Head Wo Contrast  05/09/2015   CLINICAL DATA:  Slurred speech, stroke  EXAM: CT HEAD WITHOUT CONTRAST  TECHNIQUE: Contiguous axial images were obtained from the base of the skull through the vertex without intravenous contrast.  COMPARISON:  09/30/2011  FINDINGS: No skull fracture is noted. Paranasal sinuses and mastoid air cells are unremarkable. No intracranial hemorrhage, mass effect or midline shift. No acute cortical infarction. No mass lesion is noted on this unenhanced scan. The gray and white-matter differentiation is preserved.  IMPRESSION: No acute intracranial abnormality. No definite acute cortical infarction. These results were called by telephone at the time of interpretation on 05/09/2015 at 10:50 am to Dr. Thad Ranger , who verbally acknowledged these results.   Electronically Signed   By: Natasha Mead M.D.   On: 05/09/2015 10:51    Felicie Morn PA-C [facility] [phone]  05/09/2015, 11:52 AM   Patient seen and examined.  Clinical course and management discussed.  Necessary edits performed.  I agree with the above.  Assessment and plan of care developed and discussed below.    Assessment: 49 y.o. female presenting  with complaints of slurred speech and left facial numbness.  BP poorly controlled.  NIHSS of 0.  Head CT independently reviewed and shows no acute changes.  Patient remains within the time window.  Will continue to follow.  Based on current symptoms, tPA/intervention not indicated at this time.    Stroke Risk Factors - hypertension  Plan: 1. HgbA1c, fasting lipid panel 2. MRI, MRA  of the brain without contrast 3. PT consult, OT consult, Speech consult 4. Echocardiogram 5. Carotid dopplers 6. Prophylactic therapy-Antiplatelet med: Aspirin - dose 325mg  daily 7. NPO until RN stroke swallow screen 8. Telemetry monitoring 9. Frequent neuro checks 10. BP control    Thana Farr, MD [facility] [phone]  05/09/2015  11:55 AM

## 2015-05-09 NOTE — ED Notes (Signed)
Pt to department via EMS- pt reports that she was at work and went to [facility] for slurred speech and left sided mouth numbness that started at 0900. Pt A&Ox4 on arrival. Bp-178/120 Hr-80 CBG-94 20g RAC.

## 2015-05-09 NOTE — Progress Notes (Signed)
Pt is admitted to 5M08. Admission vital sign is stable 

## 2015-05-09 NOTE — ED Provider Notes (Signed)
CSN: [id number]     Arrival date & time 05/09/15  1030 History   First MD Initiated Contact with Patient 05/09/15 1045     Chief Complaint  Patient presents with  . Code Stroke    @EDPCLEARED @ (Consider location/radiation/quality/duration/timing/severity/associated sxs/prior Treatment) HPI Comments: 49 year old female with a chief complaint of slurred speech in paresthesias to the left side of her mouth. This started just prior to arrival. Patient was made a code stroke by EMS. Sent immediately to the CT scanner.  Patient is a 49 y.o. female presenting with Acute Neurological Problem. The history is provided by the patient.  Cerebrovascular Accident This is a new problem. The current episode started less than 1 hour ago. The problem occurs constantly. The problem has not changed since onset.Pertinent negatives include no chest pain, no headaches and no shortness of breath. Nothing aggravates the symptoms. Nothing relieves the symptoms. She has tried nothing for the symptoms. The treatment provided no relief.    Past Medical History  Diagnosis Date  . Hypertension    Past Surgical History  Procedure Laterality Date  . Eye surgery    . Foot surgery    . Appendectomy    . Tubal ligaton     Family History  Problem Relation Age of Onset  . Hypertension Mother   . Hypertension Father    Social History  Substance Use Topics  . Smoking status: Never Smoker   . Smokeless tobacco: None  . Alcohol Use: No   OB History    No data available     Review of Systems  Constitutional: Negative for fever and chills.  HENT: Negative for congestion and rhinorrhea.   Eyes: Negative for redness and visual disturbance.  Respiratory: Negative for shortness of breath and wheezing.   Cardiovascular: Negative for chest pain and palpitations.  Gastrointestinal: Negative for nausea and vomiting.  Genitourinary: Negative for dysuria and urgency.  Musculoskeletal: Negative for myalgias and  arthralgias.  Skin: Negative for pallor and wound.  Neurological: Positive for speech difficulty. Negative for dizziness and headaches.       Paresthesias to the mouth      Allergies  Review of patient's allergies indicates no known allergies.  Home Medications   Prior to Admission medications   Medication Sig Start Date End Date Taking? Authorizing Provider  amLODipine (NORVASC) 10 MG tablet Take 1 tablet (10 mg total) by mouth daily. 11/15/14  Yes Harle Battiest, NP  lisinopril-hydrochlorothiazide (PRINZIDE,ZESTORETIC) 20-25 MG per tablet Take 1 tablet by mouth daily. 11/15/14  Yes Harle Battiest, NP  metoprolol tartrate (LOPRESSOR) 25 MG tablet Take 1 tablet (25 mg total) by mouth 2 (two) times daily. 11/15/14  Yes Harle Battiest, NP  omeprazole (PRILOSEC) 20 MG capsule Take 1 capsule (20 mg total) by mouth daily. Patient not taking: Reported on 05/09/2015 11/15/14   Harle Battiest, NP   BP 181/87 mmHg  Pulse 70  Temp(Src) 98.3 F (36.8 C) (Oral)  Resp 22  Ht 5\' 4"  (1.626 m)  Wt 197 lb 15.6 oz (89.8 kg)  BMI 33.97 kg/m2  SpO2 100%  LMP 04/10/2015 Physical Exam  Constitutional: She is oriented to person, place, and time. She appears well-developed and well-nourished. No distress.  HENT:  Head: Normocephalic and atraumatic.  Eyes: EOM are normal. Pupils are equal, round, and reactive to light.  Neck: Normal range of motion. Neck supple.  Cardiovascular: Normal rate and regular rhythm.  Exam reveals no gallop and no friction rub.   No murmur heard.  Pulmonary/Chest: Effort normal. She has no wheezes. She has no rales.  Abdominal: Soft. She exhibits no distension. There is no tenderness.  Musculoskeletal: She exhibits no edema or tenderness.  Neurological: She is alert and oriented to person, place, and time. She has normal strength. No cranial nerve deficit or sensory deficit. She displays a negative Romberg sign. GCS eye subscore is 4. GCS verbal subscore is 5.  GCS motor subscore is 6. She displays no Babinski's sign on the right side. She displays no Babinski's sign on the left side.  Skin: Skin is warm and dry. She is not diaphoretic.  Psychiatric: She has a normal mood and affect. Her behavior is normal.    ED Course  Procedures (including critical care time) Labs Review Labs Reviewed  CBC - Abnormal; Notable for the following:    Hemoglobin 11.0 (*)    HCT 35.5 (*)    MCV 74.0 (*)    MCH 22.9 (*)    RDW 17.1 (*)    All other components within normal limits  COMPREHENSIVE METABOLIC PANEL - Abnormal; Notable for the following:    Glucose, Bld 107 (*)    All other components within normal limits  I-STAT CHEM 8, ED - Abnormal; Notable for the following:    Glucose, Bld 105 (*)    All other components within normal limits  CBG MONITORING, ED - Abnormal; Notable for the following:    Glucose-Capillary 101 (*)    All other components within normal limits  DIFFERENTIAL  URINALYSIS, ROUTINE W REFLEX MICROSCOPIC (NOT AT [facility])  URINE RAPID DRUG SCREEN, HOSP PERFORMED  ETHANOL  ETHANOL  PROTIME-INR  APTT  I-STAT TROPOININ, ED    Imaging Review Ct Head Wo Contrast  05/09/2015   CLINICAL DATA:  Slurred speech, stroke  EXAM: CT HEAD WITHOUT CONTRAST  TECHNIQUE: Contiguous axial images were obtained from the base of the skull through the vertex without intravenous contrast.  COMPARISON:  09/30/2011  FINDINGS: No skull fracture is noted. Paranasal sinuses and mastoid air cells are unremarkable. No intracranial hemorrhage, mass effect or midline shift. No acute cortical infarction. No mass lesion is noted on this unenhanced scan. The gray and white-matter differentiation is preserved.  IMPRESSION: No acute intracranial abnormality. No definite acute cortical infarction. These results were called by telephone at the time of interpretation on 05/09/2015 at 10:50 am to Dr. Thad Ranger , who verbally acknowledged these results.   Electronically Signed   By:  Natasha Mead M.D.   On: 05/09/2015 10:51   Mr Shirlee Latch Wo Contrast  05/09/2015   CLINICAL DATA:  49 year old female with sudden onset slurred speech and left facial numbness at 0900 hrs. Most symptoms resolved. Elevated blood pressure, 197 systolic. Initial encounter.  EXAM: MRI HEAD WITHOUT CONTRAST  MRA HEAD WITHOUT CONTRAST  TECHNIQUE: Multiplanar, multiecho pulse sequences of the brain and surrounding structures were obtained without intravenous contrast. Angiographic images of the head were obtained using MRA technique without contrast.  COMPARISON:  Head CTs without contrast 1045 hr today and earlier.  FINDINGS: MRI HEAD FINDINGS  Partially empty sella configuration, stable since 2013. No restricted diffusion to suggest acute infarction. No midline shift, mass effect, evidence of mass lesion, ventriculomegaly, extra-axial collection or acute intracranial hemorrhage. Cervicomedullary junction are within normal limits.  Scattered and patchy bilateral cerebral white matter T2 and FLAIR hyperintensity in a nonspecific configuration, moderate for age, with some deep white matter capsule involvement. Superimposed mild to moderate for age deep gray matter T2 heterogeneity,  most notably in both thalami. No cortical encephalomalacia or chronic cerebral blood products identified. Brainstem and cerebellum within normal limits.  Visible internal auditory structures appear normal. Mastoids and paranasal sinuses are clear. Negative scalp soft tissues. Negative visualized cervical spine. Visualized bone marrow signal is within normal limits. 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 Within normal limits. Major intracranial vascular flow voids  MRA HEAD FINDINGS  Fairly codominant distal vertebral arteries with antegrade flow in the posterior circulation. Normal PICA origins. Mildly tortuous vertebrobasilar junction. Tortuous  basilar artery without stenosis. Normal SCA and PCA origins. Posterior communicating arteries are diminutive or absent. Bilateral PCA branches are within normal limits.  Antegrade flow in both ICA siphons. No siphon stenosis. Tortuous but otherwise normal left ophthalmic artery origin. Normal right ophthalmic artery origin. Normal carotid termini, MCA and ACA origins. Anterior communicating artery (appears fenestrated) and visualized bilateral ACA branches are within normal limits. MCA M1 segments and bifurcations are within normal limits. Visualized bilateral MCA branches are within normal limits.  IMPRESSION: 1. No acute intracranial abnormality. 2. Moderate for age cerebral white matter and some deep gray matter signal abnormality, nonspecific, but favor small vessel disease/hypertensive changes in this setting. 3. Chronic partially empty sella configuration, often a normal anatomic variant but can be associated with idiopathic intracranial hypertension (pseudotumor cerebri). 4.  Negative intracranial MRA.   Electronically Signed   By: Odessa Fleming M.D.   On: 05/09/2015 13:59   Mr Brain Wo Contrast  05/09/2015   CLINICAL DATA:  49 year old female with sudden onset slurred speech and left facial numbness at 0900 hrs. Most symptoms resolved. Elevated blood pressure, 197 systolic. Initial encounter.  EXAM: MRI HEAD WITHOUT CONTRAST  MRA HEAD WITHOUT CONTRAST  TECHNIQUE: Multiplanar, multiecho pulse sequences of the brain and surrounding structures were obtained without intravenous contrast. Angiographic images of the head were obtained using MRA technique without contrast.  COMPARISON:  Head CTs without contrast 1045 hr today and earlier.  FINDINGS: MRI HEAD FINDINGS  Partially empty sella configuration, stable since 2013. No restricted diffusion to suggest acute infarction. No midline shift, mass effect, evidence of mass lesion, ventriculomegaly, extra-axial collection or acute intracranial hemorrhage.  Cervicomedullary junction are within normal limits.  Scattered and patchy bilateral cerebral white matter T2 and FLAIR hyperintensity in a nonspecific configuration, moderate for age, with some deep white matter capsule involvement. Superimposed mild to moderate for age deep gray matter T2 heterogeneity, most notably in both thalami. No cortical encephalomalacia or chronic cerebral blood products identified. Brainstem and cerebellum within normal limits.  Visible internal auditory structures appear normal. Mastoids and paranasal sinuses are clear. Negative scalp soft tissues. Negative visualized cervical spine. Visualized bone marrow signal is within normal limits. 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 Within normal limits. Major intracranial vascular flow voids  MRA HEAD FINDINGS  Fairly codominant distal vertebral arteries with antegrade flow in the posterior circulation. Normal PICA origins. Mildly tortuous vertebrobasilar junction. Tortuous basilar artery without stenosis. Normal SCA and PCA origins. Posterior communicating arteries are diminutive or absent. Bilateral PCA branches are within normal limits.  Antegrade flow in both ICA siphons. No siphon stenosis. Tortuous but otherwise normal left ophthalmic artery origin. Normal right ophthalmic artery origin. Normal carotid termini, MCA and ACA origins. Anterior communicating artery (appears fenestrated) and visualized bilateral ACA branches are within  normal limits. MCA M1 segments and bifurcations are within normal limits. Visualized bilateral MCA branches are within normal limits.  IMPRESSION: 1. No acute intracranial abnormality. 2. Moderate for age cerebral white matter and some deep gray matter signal abnormality, nonspecific, but favor small vessel disease/hypertensive changes in this setting. 3. Chronic partially empty sella configuration, often a  normal anatomic variant but can be associated with idiopathic intracranial hypertension (pseudotumor cerebri). 4.  Negative intracranial MRA.   Electronically Signed   By: Odessa Fleming M.D.   On: 05/09/2015 13:59   I have personally reviewed and evaluated these images and lab results as part of my medical decision-making.   EKG Interpretation None      MDM   Final diagnoses:  CVA (cerebral infarction)  CVA (cerebral infarction)    49 yo F with a chief complaint of sudden onset neurologic symptoms. Patient was made a code stroke and sent initially to the CT scanner. CT head negative. On my examination patient with improvement of slurred speech however now having left-sided facial pain. Neurologically intact on my exam.  Evaluated by neuro hospitalist on arrival. Would like patient admitted.  MRI unremarkable. Troponin negative. UA unremarkable.  The patients results and plan were reviewed and discussed.   Any x-rays performed were independently reviewed by myself.   Differential diagnosis were considered with the presenting HPI.  Medications - No data to display  Filed Vitals:   05/09/15 1211 05/09/15 1440 05/09/15 1500 05/09/15 1530  BP: 178/94 179/97 166/95 181/87  Pulse: 73 72 66 70  Temp:      TempSrc:      Resp: 16 17 15 22   Height:      Weight:      SpO2: 100% 100% 100% 100%    Final diagnoses:  CVA (cerebral infarction)  CVA (cerebral infarction)    Admission/ observation were discussed with the admitting physician, patient and/or family and they are comfortable with the plan.    Melene Plan, DO 05/09/15 1555

## 2015-05-09 NOTE — Plan of Care (Signed)
Discussed with pt about POINT trial. She has Hx of HTN and on arrival her BP was elevated with SBP > 140. She has left facial numbness with slurry speech occurred at 9am and lasting slightly more than one hour as per pt. She denies any weakness, HA, hx of migraine, dizziness, visual changes or LOC. MRI and MRA unremarkable. She will be admitted for TIA work up. Her ABCD2 score is 4 and she has moderate risk for stroke. Discussed with her about POINT trial and she is interested to be enrolled into study. Will have her randomized and start investigational meds within 12 hours.   Marvel Plan, MD PhD Stroke Neurology 05/09/2015 3:41 PM

## 2015-05-09 NOTE — H&P (Signed)
[facility] History and Physical                                                                                    Hannah Ellis, is a 49 y.o. female  MRN: [id number]   DOB - Jul 05, 1966  Admit Date - 05/09/2015  Outpatient Primary MD for the patient is No primary care provider on file.  Referring MD: Adela Lank / ER  Consulting M.D: Thad Ranger / Neurology  With History of -  Past Medical History  Diagnosis Date  . Hypertension       Past Surgical History  Procedure Laterality Date  . Eye surgery    . Foot surgery    . Appendectomy    . Tubal ligaton      in for   Chief Complaint  Patient presents with  . Code Stroke     HPI This is a 49 year old female patient with history of hypertension who currently admits nonadherence to taking antihypertensive medications correctly who presents with slurred speech and facial numbness patient reports that she was normal upon awakening and went to work as usual. After arrival to work she noticed her left face was numb and she had slurred speech. This was around 9 AM. She went to [facility] who sent the patient to [facility] ER. By the time she arrived to the ER her speech had improved to nearly baseline but her facial numbness persisted.  In the ER patient was afebrile with marked systolic hypertension with a blood pressure reading of 190/87 which has since currently improved to 181/87, pulse rate 78 and respirations 18, room air saturations on percent. Laboratory data was unremarkable except for microcytic anemia with a hemoglobin of 11 and an MCV of 74. Troponin was 0.00, alcohol level was less than 5, urine drug screen was negative, CT of the head as well as MRI MRA of the brain was unremarkable.   Review of Systems   In addition to the HPI above,  No Fever-chills, myalgias or other constitutional symptoms No Headache, changes with Vision or hearing, new weakness, tingling, numbness in any extremity only new issue with slurred  speech and right facial tingling No problems swallowing food or Liquids, indigestion/reflux No Chest pain, Cough or Shortness of Breath, palpitations, orthopnea or DOE No Abdominal pain, N/V; no melena or hematochezia, no dark tarry stools, Bowel movements are regular, No dysuria, hematuria or flank pain No new skin rashes, lesions, masses or bruises, No new joints pains-aches No recent weight gain or loss No polyuria, polydypsia or polyphagia,  *A full 10 point Review of Systems was done, except as stated above, all other Review of Systems were negative.  Social History Social History  Substance Use Topics  . Smoking status: Never Smoker   . Smokeless tobacco: Not on file  . Alcohol Use: No    Resides at: Private residence  Lives with: Husband  Ambulatory status: Without assistive devices   Family History Family History  Problem Relation Age of Onset  . Hypertension Mother   . Hypertension Father      Prior to Admission medications   Medication Sig Start Date End Date Taking?  Authorizing Provider  amLODipine (NORVASC) 10 MG tablet Take 1 tablet (10 mg total) by mouth daily. 11/15/14  Yes Harle Battiest, NP  lisinopril-hydrochlorothiazide (PRINZIDE,ZESTORETIC) 20-25 MG per tablet Take 1 tablet by mouth daily. 11/15/14  Yes Harle Battiest, NP  metoprolol tartrate (LOPRESSOR) 25 MG tablet Take 1 tablet (25 mg total) by mouth 2 (two) times daily. 11/15/14  Yes Harle Battiest, NP  omeprazole (PRILOSEC) 20 MG capsule Take 1 capsule (20 mg total) by mouth daily. Patient not taking: Reported on 05/09/2015 11/15/14   Harle Battiest, NP    No Known Allergies  Physical Exam  Vitals  Blood pressure 181/87, pulse 70, temperature 98.3 F (36.8 C), temperature source Oral, resp. rate 22, height 5\' 4"  (1.626 m), weight 197 lb 15.6 oz (89.8 kg), last menstrual period 04/10/2015, SpO2 100 %.   General:  In no acute distress, appears healthy and well nourished  Psych:   Normal affect, Denies Suicidal or Homicidal ideations, Awake Alert, Oriented X 3. Speech and thought patterns are clear and appropriate, no apparent short term memory deficits  Neuro:   No focal neurological deficits, CN II through XII intact, Strength 5/5 all 4 extremities, Sensation intact all 4 extremities. Patient continues to report mild left facial tingling  ENT:  Ears and Eyes appear Normal, Conjunctivae clear, PER. Moist oral mucosa without erythema or exudates.  Neck:  Supple, No lymphadenopathy appreciated  Respiratory:  Symmetrical chest wall movement, Good air movement bilaterally, CTAB. Room Air  Cardiac:  RRR, No Murmurs, no LE edema noted, no JVD, No carotid bruits, peripheral pulses palpable at 2+  Abdomen:  Positive bowel sounds, Soft, Non tender, Non distended,  No masses appreciated, no obvious hepatosplenomegaly  Skin:  No Cyanosis, Normal Skin Turgor, No Skin Rash or Bruise.  Extremities: Symmetrical without obvious trauma or injury,  no effusions.  Data Review  CBC  Recent Labs Lab 05/09/15 1046 05/09/15 1523  WBC  --  6.9  HGB 12.9 11.0*  HCT 38.0 35.5*  PLT  --  178  MCV  --  74.0*  MCH  --  22.9*  MCHC  --  31.0  RDW  --  17.1*  LYMPHSABS  --  1.7  MONOABS  --  0.3  EOSABS  --  0.1  BASOSABS  --  0.0    Chemistries   Recent Labs Lab 05/09/15 1046 05/09/15 1523  NA 138 138  K 3.8 3.8  CL 104 107  CO2  --  25  GLUCOSE 105* 107*  BUN 9 8  CREATININE 0.80 0.85  CALCIUM  --  9.1  AST  --  19  ALT  --  14  ALKPHOS  --  60  BILITOT  --  0.3    estimated creatinine clearance is 86.8 mL/min (by C-G formula based on Cr of 0.85).  No results for input(s): TSH, T4TOTAL, T3FREE, THYROIDAB in the last 72 hours.  Invalid input(s): FREET3  Coagulation profile No results for input(s): INR, PROTIME in the last 168 hours.  No results for input(s): DDIMER in the last 72 hours.  Cardiac Enzymes No results for input(s): CKMB, TROPONINI,  MYOGLOBIN in the last 168 hours.  Invalid input(s): CK  Invalid input(s): POCBNP  Urinalysis    Component Value Date/Time   COLORURINE YELLOW 05/09/2015 1130   APPEARANCEUR CLEAR 05/09/2015 1130   LABSPEC 1.010 05/09/2015 1130   PHURINE 6.5 05/09/2015 1130   GLUCOSEU NEGATIVE 05/09/2015 1130   HGBUR NEGATIVE 05/09/2015 1130  BILIRUBINUR NEGATIVE 05/09/2015 1130   KETONESUR NEGATIVE 05/09/2015 1130   PROTEINUR NEGATIVE 05/09/2015 1130   UROBILINOGEN 0.2 05/09/2015 1130   NITRITE NEGATIVE 05/09/2015 1130   LEUKOCYTESUR NEGATIVE 05/09/2015 1130    Imaging results:   Ct Head Wo Contrast  05/09/2015   CLINICAL DATA:  Slurred speech, stroke  EXAM: CT HEAD WITHOUT CONTRAST  TECHNIQUE: Contiguous axial images were obtained from the base of the skull through the vertex without intravenous contrast.  COMPARISON:  09/30/2011  FINDINGS: No skull fracture is noted. Paranasal sinuses and mastoid air cells are unremarkable. No intracranial hemorrhage, mass effect or midline shift. No acute cortical infarction. No mass lesion is noted on this unenhanced scan. The gray and white-matter differentiation is preserved.  IMPRESSION: No acute intracranial abnormality. No definite acute cortical infarction. These results were called by telephone at the time of interpretation on 05/09/2015 at 10:50 am to Dr. Thad Ranger , who verbally acknowledged these results.   Electronically Signed   By: Natasha Mead M.D.   On: 05/09/2015 10:51   Mr Shirlee Latch Wo Contrast  05/09/2015   CLINICAL DATA:  49 year old female with sudden onset slurred speech and left facial numbness at 0900 hrs. Most symptoms resolved. Elevated blood pressure, 197 systolic. Initial encounter.  EXAM: MRI HEAD WITHOUT CONTRAST  MRA HEAD WITHOUT CONTRAST  TECHNIQUE: Multiplanar, multiecho pulse sequences of the brain and surrounding structures were obtained without intravenous contrast. Angiographic images of the head were obtained using MRA technique  without contrast.  COMPARISON:  Head CTs without contrast 1045 hr today and earlier.  FINDINGS: MRI HEAD FINDINGS  Partially empty sella configuration, stable since 2013. No restricted diffusion to suggest acute infarction. No midline shift, mass effect, evidence of mass lesion, ventriculomegaly, extra-axial collection or acute intracranial hemorrhage. Cervicomedullary junction are within normal limits.  Scattered and patchy bilateral cerebral white matter T2 and FLAIR hyperintensity in a nonspecific configuration, moderate for age, with some deep white matter capsule involvement. Superimposed mild to moderate for age deep gray matter T2 heterogeneity, most notably in both thalami. No cortical encephalomalacia or chronic cerebral blood products identified. Brainstem and cerebellum within normal limits.  Visible internal auditory structures appear normal. Mastoids and paranasal sinuses are clear. Negative scalp soft tissues. Negative visualized cervical spine. Visualized bone marrow signal is within normal limits. 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 Within normal limits. Major intracranial vascular flow voids  MRA HEAD FINDINGS  Fairly codominant distal vertebral arteries with antegrade flow in the posterior circulation. Normal PICA origins. Mildly tortuous vertebrobasilar junction. Tortuous basilar artery without stenosis. Normal SCA and PCA origins. Posterior communicating arteries are diminutive or absent. Bilateral PCA branches are within normal limits.  Antegrade flow in both ICA siphons. No siphon stenosis. Tortuous but otherwise normal left ophthalmic artery origin. Normal right ophthalmic artery origin. Normal carotid termini, MCA and ACA origins. Anterior communicating artery (appears fenestrated) and visualized bilateral ACA branches are within normal limits. MCA M1 segments and bifurcations are within  normal limits. Visualized bilateral MCA branches are within normal limits.  IMPRESSION: 1. No acute intracranial abnormality. 2. Moderate for age cerebral white matter and some deep gray matter signal abnormality, nonspecific, but favor small vessel disease/hypertensive changes in this setting. 3. Chronic partially empty sella configuration, often a normal anatomic variant but can be associated with idiopathic intracranial hypertension (pseudotumor cerebri). 4.  Negative intracranial MRA.   Electronically Signed  By: Odessa Fleming M.D.   On: 05/09/2015 13:59   Mr Brain Wo Contrast  05/09/2015   CLINICAL DATA:  49 year old female with sudden onset slurred speech and left facial numbness at 0900 hrs. Most symptoms resolved. Elevated blood pressure, 197 systolic. Initial encounter.  EXAM: MRI HEAD WITHOUT CONTRAST  MRA HEAD WITHOUT CONTRAST  TECHNIQUE: Multiplanar, multiecho pulse sequences of the brain and surrounding structures were obtained without intravenous contrast. Angiographic images of the head were obtained using MRA technique without contrast.  COMPARISON:  Head CTs without contrast 1045 hr today and earlier.  FINDINGS: MRI HEAD FINDINGS  Partially empty sella configuration, stable since 2013. No restricted diffusion to suggest acute infarction. No midline shift, mass effect, evidence of mass lesion, ventriculomegaly, extra-axial collection or acute intracranial hemorrhage. Cervicomedullary junction are within normal limits.  Scattered and patchy bilateral cerebral white matter T2 and FLAIR hyperintensity in a nonspecific configuration, moderate for age, with some deep white matter capsule involvement. Superimposed mild to moderate for age deep gray matter T2 heterogeneity, most notably in both thalami. No cortical encephalomalacia or chronic cerebral blood products identified. Brainstem and cerebellum within normal limits.  Visible internal auditory structures appear normal. Mastoids and paranasal sinuses  are clear. Negative scalp soft tissues. Negative visualized cervical spine. Visualized bone marrow signal is within normal limits. 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 Within normal limits. Major intracranial vascular flow voids  MRA HEAD FINDINGS  Fairly codominant distal vertebral arteries with antegrade flow in the posterior circulation. Normal PICA origins. Mildly tortuous vertebrobasilar junction. Tortuous basilar artery without stenosis. Normal SCA and PCA origins. Posterior communicating arteries are diminutive or absent. Bilateral PCA branches are within normal limits.  Antegrade flow in both ICA siphons. No siphon stenosis. Tortuous but otherwise normal left ophthalmic artery origin. Normal right ophthalmic artery origin. Normal carotid termini, MCA and ACA origins. Anterior communicating artery (appears fenestrated) and visualized bilateral ACA branches are within normal limits. MCA M1 segments and bifurcations are within normal limits. Visualized bilateral MCA branches are within normal limits.  IMPRESSION: 1. No acute intracranial abnormality. 2. Moderate for age cerebral white matter and some deep gray matter signal abnormality, nonspecific, but favor small vessel disease/hypertensive changes in this setting. 3. Chronic partially empty sella configuration, often a normal anatomic variant but can be associated with idiopathic intracranial hypertension (pseudotumor cerebri). 4.  Negative intracranial MRA.   Electronically Signed   By: Odessa Fleming M.D.   On: 05/09/2015 13:59     EKG: (Independently reviewed) sinus rhythm, normal ST segments and normal-T waves, QTC slightly prolonged at 507 ms-PR interval also slightly prolonged and evidence of incomplete right bundle branch block   Assessment & Plan  Principal Problem:   TIA  -Admit to telemetry -Appreciate neurology assistance; currently no  clinical signs consistent with stroke -Dr. Roda Shutters has spoken with the patient and she is to be enrolled in the POINT trial -Begin full dose aspirin -Check lipid panel and hemoglobin A1c -Check carotid duplex and echocardiogram -Check two-view chest x-ray -PT/OT/SLP evaluations -Suspect poorly controlled blood pressure contributing to her current symptomatology  Active Problems:   Uncontrolled hypertension -Secondary to patient nonadherence -Resume preadmission Norvasc, Prinzide and Lopressor -Suspect poorly controlled blood pressure contributing to presentation symptoms    Microcytic anemia -Check anemia panel and TSH -Check stools for blood    Patient nonadherence -Discussed with patient the importance of taking all  medications as prescribed especially any hypertensive medications -She is aware that current symptoms likely related to uncontrolled blood pressure and that if prolonged uncontrolled blood pressure persists can lead to a full-blown stroke -Also informed that poorly controlled blood pressure can lead to other problems such as cardiomyopathies and heart failure -Patient may need prescription for any hypertensive medications at time of discharge -Patient utilizes [facility] as her primary physician-may need to be referred to a formal primary care provider    DVT Prophylaxis: Lovenox  Family Communication:   Husband at bedside  Code Status:  Full code  Condition:  Stable  Discharge disposition: Anticipate discharge to home once TIA workup completed and blood pressure better controlled  Time spent in minutes : 60      ELLIS,ALLISON L. ANP on 05/09/2015 at 4:02 PM  Between 7am to 7pm - Pager - [phone]  After 7pm go to [email or web address] - password [email or web address]  And look for the night coverage person covering me after hours  [facility]

## 2015-05-09 NOTE — ED Notes (Signed)
CBG 101 reported to Jason Nest RN

## 2015-05-10 ENCOUNTER — Encounter (HOSPITAL_COMMUNITY): Payer: Self-pay | Admitting: General Practice

## 2015-05-10 ENCOUNTER — Ambulatory Visit (HOSPITAL_BASED_OUTPATIENT_CLINIC_OR_DEPARTMENT_OTHER): Payer: BLUE CROSS/BLUE SHIELD

## 2015-05-10 ENCOUNTER — Observation Stay (HOSPITAL_BASED_OUTPATIENT_CLINIC_OR_DEPARTMENT_OTHER): Payer: BLUE CROSS/BLUE SHIELD

## 2015-05-10 DIAGNOSIS — E785 Hyperlipidemia, unspecified: Secondary | ICD-10-CM | POA: Diagnosis not present

## 2015-05-10 DIAGNOSIS — Z9119 Patient's noncompliance with other medical treatment and regimen: Secondary | ICD-10-CM

## 2015-05-10 DIAGNOSIS — I10 Essential (primary) hypertension: Secondary | ICD-10-CM | POA: Diagnosis not present

## 2015-05-10 DIAGNOSIS — G459 Transient cerebral ischemic attack, unspecified: Secondary | ICD-10-CM

## 2015-05-10 DIAGNOSIS — G451 Carotid artery syndrome (hemispheric): Secondary | ICD-10-CM | POA: Diagnosis not present

## 2015-05-10 LAB — LIPID PANEL
Cholesterol: 147 mg/dL (ref 0–200)
HDL: 49 mg/dL (ref >40)
LDL CALC: 83 mg/dL (ref 0–99)
Total CHOL/HDL Ratio: 3 RATIO
Triglycerides: 75 mg/dL (ref <150)
VLDL: 15 mg/dL (ref 0–40)

## 2015-05-10 LAB — VITAMIN B12: VITAMIN B 12: 487 pg/mL (ref 180–914)

## 2015-05-10 LAB — FERRITIN: FERRITIN: 4 ng/mL — AB (ref 11–307)

## 2015-05-10 LAB — RETICULOCYTES
RBC.: 5.21 MIL/uL — AB (ref 3.87–5.11)
RETIC CT PCT: 1.3 % (ref 0.4–3.1)
Retic Count, Absolute: 67.7 10*3/uL (ref 19.0–186.0)

## 2015-05-10 LAB — IRON AND TIBC
Iron: 32 ug/dL (ref 28–170)
SATURATION RATIOS: 6 % — AB (ref 10.4–31.8)
TIBC: 493 ug/dL — ABNORMAL HIGH (ref 250–450)
UIBC: 461 ug/dL

## 2015-05-10 LAB — FOLATE: Folate: 21.9 ng/mL (ref >5.9)

## 2015-05-10 LAB — TSH: TSH: 4.279 u[IU]/mL (ref 0.350–4.500)

## 2015-05-10 MED ORDER — HYDRALAZINE HCL 10 MG PO TABS
10.0000 mg | ORAL_TABLET | Freq: Three times a day (TID) | ORAL | Status: DC
  Administered 2015-05-10 – 2015-05-11 (×4): 10 mg via ORAL
  Filled 2015-05-10 (×7): qty 1

## 2015-05-10 MED ORDER — HYDROCHLOROTHIAZIDE 25 MG PO TABS
25.0000 mg | ORAL_TABLET | Freq: Every day | ORAL | Status: DC
  Administered 2015-05-11: 25 mg via ORAL
  Filled 2015-05-10 (×2): qty 1

## 2015-05-10 MED ORDER — LISINOPRIL 40 MG PO TABS
40.0000 mg | ORAL_TABLET | Freq: Every day | ORAL | Status: DC
  Administered 2015-05-11: 40 mg via ORAL
  Filled 2015-05-10: qty 2
  Filled 2015-05-10: qty 1

## 2015-05-10 MED ORDER — ATORVASTATIN CALCIUM 10 MG PO TABS
10.0000 mg | ORAL_TABLET | Freq: Every day | ORAL | Status: DC
  Administered 2015-05-10: 10 mg via ORAL
  Filled 2015-05-10 (×2): qty 1

## 2015-05-10 NOTE — Evaluation (Signed)
Speech Language Pathology Evaluation Patient Details Name: Hannah Ellis MRN: [id number] DOB: 02/09/1966 Today's Date: 05/10/2015 Time: 4098-1191 SLP Time Calculation (min) (ACUTE ONLY): 29 min  Problem List:  Patient Active Problem List   Diagnosis Date Noted  . TIA (transient ischemic attack) 05/09/2015  . Uncontrolled hypertension 05/09/2015  . Patient nonadherence 05/09/2015  . Microcytic anemia 05/09/2015   Past Medical History:  Past Medical History  Diagnosis Date  . Hypertension   . GERD (gastroesophageal reflux disease)   . Depression   . TIA (transient ischemic attack) 05/09/2015   Past Surgical History:  Past Surgical History  Procedure Laterality Date  . Eye surgery Left ~ 1972  . Foot surgery Right     "took some bone out of pinky and one next to it"  . Appendectomy    . Cesarean section  1998  . Tubal ligation  1998   HPI:  49 yo female adm to [facility] acute hospital with slurred speech and facial numbness.  PMH has been noncompliant with medications per her report due to stress.  Pt work up negative for acute CVA.  Speech evaluation ordered.  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    Assessment / Plan / Recommendation Clinical Impression  Pt presents with functional cognitive linguistic skills, scoring WFL within [facility] = minimal area of difficulty including memory.  Pt recalled 4/5 words independently - one with category cue.   Pt able to name 11 words starting with "B" and was fully fluent without articulation/word finding deficits.   Provided pt with written memory strategies given her report of forgetting things at work at times.  Using teach back, education completed and no further SLP warranted.      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[employer])   Cognition  Arousal/Alertness: Awake/alert Orientation Level: Oriented X4 Attention: Sustained;Selective Sustained Attention: Appears intact Selective Attention: Appears intact Memory: Appears intact (recalled 4 of 5 words independently within 10 minutes) Awareness: Appears intact Problem Solving: Appears intact Safety/Judgment: Appears intact    Comprehension  Auditory Comprehension Overall Auditory Comprehension: Appears within functional limits for tasks assessed Yes/No Questions: Not tested Commands: Within Functional Limits Conversation: Complex Visual Recognition/Discrimination Discrimination: Within Function Limits Reading Comprehension Reading Status: Within funtional limits    Expression Expression Primary Mode of Expression: Verbal Verbal Expression Overall Verbal Expression: Appears within functional limits for tasks assessed Level of Generative/Spontaneous Verbalization: Conversation Repetition: No impairment Naming: No impairment Pragmatics: No impairment Written Expression Dominant Hand: Right Written Expression: Not tested   Oral / Motor Oral Motor/Sensory Function Overall Oral Motor/Sensory Function: Appears within functional limits for tasks assessed (pt reports left facial numbness has resolved) Motor Speech Overall Motor Speech: Appears within functional limits for tasks assessed Respiration: Within functional limits Resonance: Within functional limits Articulation: Within functional limitis Motor Planning: Witnin functional limits   GO     Donavan Burnet, MS [facility] SLP [phone]

## 2015-05-10 NOTE — Progress Notes (Signed)
STROKE TEAM PROGRESS NOTE   HISTORY Jaella Orea is an 49 y.o. female who was brought to hospital secondary to sudden onset of slurred speech and left facial numbness at 0900. Patient states the slurred speech lasted for about one hour and then resolved. She still notes she has some tingling in the corner of her left mouth. No other symptoms noted. She denies HA or previous CVA. She does have elevated BP 197/112 but states this has been elevated for multiple weeks and has not seen her PCP "in a while". She denies taking any antiplatelet.   OF note: she has been under significant stress lately and feels this has effected her BP.  Date last known well: Date: 05/09/2015 Time last known well: Time: 09:00 tPA Given: No: minimal symptoms Modified Rankin: Rankin Score=0   SUBJECTIVE (INTERVAL HISTORY) No family is at the bedside.  Overall she feels her condition is completely resolved. Pt undecided on POINT trial yesterday so she missed the change to be enrolled.   OBJECTIVE Temp:  [98.2 F (36.8 C)-99 F (37.2 C)] 98.5 F (36.9 C) (08/17 1343) Pulse Rate:  [58-85] 66 (08/17 1343) Cardiac Rhythm:  [-] Heart block (08/17 0800) Resp:  [15-22] 20 (08/17 1343) BP: (120-191)/(72-111) 141/72 mmHg (08/17 1343) SpO2:  [97 %-100 %] 98 % (08/17 1343)   Recent Labs Lab 05/09/15 1052  GLUCAP 101*    Recent Labs Lab 05/09/15 1046 05/09/15 1523  NA 138 138  K 3.8 3.8  CL 104 107  CO2  --  25  GLUCOSE 105* 107*  BUN 9 8  CREATININE 0.80 0.85  CALCIUM  --  9.1    Recent Labs Lab 05/09/15 1523  AST 19  ALT 14  ALKPHOS 60  BILITOT 0.3  PROT 6.6  ALBUMIN 3.6    Recent Labs Lab 05/09/15 1046 05/09/15 1523  WBC  --  6.9  NEUTROABS  --  4.7  HGB 12.9 11.0*  HCT 38.0 35.5*  MCV  --  74.0*  PLT  --  178   No results for input(s): CKTOTAL, CKMB, CKMBINDEX, TROPONINI in the last 168 hours.  Recent Labs  05/09/15 1523  LABPROT 13.9  INR 1.05    Recent Labs   05/09/15 1130  COLORURINE YELLOW  LABSPEC 1.010  PHURINE 6.5  GLUCOSEU NEGATIVE  HGBUR NEGATIVE  BILIRUBINUR NEGATIVE  KETONESUR NEGATIVE  PROTEINUR NEGATIVE  UROBILINOGEN 0.2  NITRITE NEGATIVE  LEUKOCYTESUR NEGATIVE       Component Value Date/Time   CHOL 147 05/10/2015 0650   TRIG 75 05/10/2015 0650   HDL 49 05/10/2015 0650   CHOLHDL 3.0 05/10/2015 0650   VLDL 15 05/10/2015 0650   LDLCALC 83 05/10/2015 0650   No results found for: HGBA1C    Component Value Date/Time   LABOPIA NONE DETECTED 05/09/2015 1126   COCAINSCRNUR NONE DETECTED 05/09/2015 1126   LABBENZ NONE DETECTED 05/09/2015 1126   AMPHETMU NONE DETECTED 05/09/2015 1126   THCU NONE DETECTED 05/09/2015 1126   LABBARB NONE DETECTED 05/09/2015 1126     Recent Labs Lab 05/09/15 1523  ETH <5   IMAGING I have personally reviewed the radiological images below and agree with the radiology interpretations.  Dg Chest 2 View 05/09/2015    Stable scarring involving the right upper lobe. No acute cardiopulmonary disease.     Ct Head Wo Contrast 05/09/2015    No acute intracranial abnormality. No definite acute cortical infarction.   Mr [name] Head Wo Contrast 05/09/2015   Negative  intracranial MRA.     Mr Brain Wo Contrast 05/09/2015   1. No acute intracranial abnormality. 2. Moderate for age cerebral white matter and some deep gray matter signal abnormality, nonspecific, but favor small vessel disease/hypertensive changes in this setting. 3. Chronic partially empty sella configuration, often a normal anatomic variant but can be associated with idiopathic intracranial hypertension (pseudotumor cerebri).   CAROTID DOPPLER  There is 1-39% bilateral ICA stenosis. Vertebral artery flow is antegrade.    TTE - pending   PHYSICAL EXAM  Temp:  [98.2 F (36.8 C)-99 F (37.2 C)] 98.5 F (36.9 C) (08/17 1650) Pulse Rate:  [58-85] 74 (08/17 1650) Resp:  [16-20] 18 (08/17 1650) BP: (120-191)/(72-111) 141/87 mmHg (08/17  1650) SpO2:  [97 %-100 %] 100 % (08/17 1650)  General - Well nourished, well developed, in no apparent distress.  Ophthalmologic - Sharp disc margins OU.   Cardiovascular - Regular rate and rhythm with no murmur.  Mental Status -  Level of arousal and orientation to time, place, and person were intact. Language including expression, naming, repetition, comprehension was assessed and found intact. Fund of Knowledge was assessed and was intact.  Cranial Nerves II - XII - II - Visual field intact OU. III, IV, VI - Extraocular movements intact. V - Facial sensation intact bilaterally. VII - Facial movement intact bilaterally. VIII - Hearing & vestibular intact bilaterally. X - Palate elevates symmetrically. XI - Chin turning & shoulder shrug intact bilaterally. XII - Tongue protrusion intact.  Motor Strength - The patient's strength was normal in all extremities and pronator drift was absent.  Bulk was normal and fasciculations were absent.   Motor Tone - Muscle tone was assessed at the neck and appendages and was normal.  Reflexes - The patient's reflexes were 1+ in all extremities and she had no pathological reflexes.  Sensory - Light touch, temperature/pinprick, vibration and proprioception, and Romberg testing were assessed and were symmetrical.    Coordination - The patient had normal movements in the hands and feet with no ataxia or dysmetria.  Tremor was absent.  Gait and Station - The patient's transfers, posture, gait, station, and turns were observed as normal.   ASSESSMENT/PLAN Ms. Dwayne Begay is a 49 y.o. female with history of hypertension, not compliant with prescribed medications presenting with left facial numbness and dysarthria. She did not receive IV t-PA due to minimal symptoms.   Resultant  Resolution of deficit  MRI  No acute stroke  MRA  Unremarkable   Carotid Doppler  No significant stenosis   2D Echo  pending   LDL 83  HgbA1c  pending  Lovenox 40 mg sq daily for VTE prophylaxis  Diet regular Room service appropriate?: Yes; Fluid consistency:: Thin  no antithrombotic prior to admission, now on aspirin 81 mg orally every day. Continue antiplatelet on discharge.  Ongoing aggressive stroke risk factor management  Therapy recommendations:  No OT, no PT  Disposition:  Return home  Accelerated Hypertension  BP 197/105 on arrival  On 4 BP meds at home  Adjusted BP meds by primary team  Follow up with PCP   Hyperlipidemia  Home meds:  No statin  LDL 83  New Lipitor 10 mg dialy  Continue statin at discharge  Other Stroke Risk Factors  Obesity, Body mass index is 33.97 kg/(m^2).   Other Active Problems  Patient non-adherence to medications  Hospital day # 1  Neurology will sign off. Please call with questions. Pt will follow up with Dr.  Glennda Weatherholtz at [facility] in about 2 months. Thanks for the consult.  Marvel Plan, MD PhD Stroke Neurology 05/10/2015 5:43 PM     To contact Stroke Continuity provider, please refer to [email or web address]. After hours, contact General Neurology

## 2015-05-10 NOTE — Progress Notes (Signed)
Occupational Therapy Evaluation Patient Details Name: Hannah Ellis MRN: [id number] DOB: 1965-11-03 Today's Date: 05/10/2015    History of Present Illness 49 y.o. female presenting with complaints of slurred speech and left facial numbness. BP poorly controlled. MRI and CT negative to date   Clinical Impression   Pt admitted with the above diagnoses. PTA pt was independent with ADLs. 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. 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 Pt presents at baseline with ADLs. Pt reports numbness on left side of her face began to resolve this morning. No further acute OT needs identified. OT signing off.     Follow Up Recommendations  No OT follow up    Equipment Recommendations  None recommended by OT    Recommendations for Other Services       Precautions / Restrictions        Mobility Bed Mobility Overal bed mobility: Independent                Transfers Overall transfer level: Independent Equipment used: None                  Balance Overall balance assessment: No apparent balance deficits (not formally assessed)                                    ADL Overall ADL's : At baseline                                       General ADL Comments: Pt completed household distance functional mobility incorporating head turns with no LOB. 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   Perception     Praxis      Pertinent Vitals/Pain Pain Assessment: No/denies pain     Hand Dominance Right   Extremity/Trunk Assessment Upper Extremity Assessment Upper Extremity Assessment: Overall WFL for tasks assessed   Lower Extremity Assessment Lower Extremity Assessment: Defer to PT evaluation;Overall WFL for tasks assessed       Communication  Communication Communication: No difficulties   Cognition Arousal/Alertness: Awake/alert Behavior During Therapy: WFL for tasks assessed/performed Overall Cognitive Status: Within Functional Limits for tasks assessed                     General Comments       Exercises       Shoulder Instructions      Home Living Family/patient expects to be discharged to:: Private residence Living Arrangements: Spouse/significant other Available Help at Discharge: Family Type of Home: House Home Access: Stairs to enter Secretary/administrator of Steps: 3 Entrance Stairs-Rails: None Home Layout: One level     Bathroom Shower/Tub: Tub/shower unit         Home Equipment: None   Additional Comments: Pt works as a Advertising copywriter at [employer]. Spouse drives due to pt baseline vision problems.  Lives With: Spouse    Prior Functioning/Environment Level of Independence: Independent             OT Diagnosis:     OT Problem List:     OT Treatment/Interventions:      OT Goals(Current goals can be found in the  care plan section)    OT Frequency:     Barriers to D/C:            Co-evaluation              End of Session    Activity Tolerance: Patient tolerated treatment well Patient left: in bed;with call bell/phone within reach;with family/visitor present   Time: 1345-1355 OT Time Calculation (min): 10 min Charges:  OT General Charges $OT Visit: 1 Procedure OT Evaluation $Initial OT Evaluation Tier I: 1 Procedure G-Codes: OT G-codes **NOT FOR INPATIENT CLASS** Functional Assessment Tool Used: clinical judgement Functional Limitation: Self care Self Care Current Status (Z6109): 0 percent impaired, limited or restricted Self Care Goal Status (U0454): 0 percent impaired, limited or restricted Self Care Discharge Status (U9811): 0 percent impaired, limited or restricted  Pilar Grammes 05/10/2015, 2:25 PM

## 2015-05-10 NOTE — Progress Notes (Signed)
VASCULAR LAB PRELIMINARY  PRELIMINARY  PRELIMINARY  PRELIMINARY  Carotid duplex  completed.    Preliminary report:  Bilateral:  1-39% ICA stenosis.  Vertebral artery flow is antegrade.      Chelci [place], RVT 05/10/2015, 2:36 PM

## 2015-05-10 NOTE — Progress Notes (Signed)
PROGRESS NOTE  Hannah Ellis OZH:[id number] DOB: Dec 10, 1965 DOA: 05/09/2015 PCP: No primary care provider on file.  Assessment/Plan: TIA  -Admit to telemetry -Appreciate neurology assistance; currently no clinical signs consistent with stroke -Begin full dose aspirin -lipid panel LDL> 70-- start statin -await carotid duplex and echocardiogram -PT- no follow up -Suspect poorly controlled blood pressure contributing to her current symptomatology   Uncontrolled hypertension -Secondary to patient nonadherence -patient claims she can not afford meds-- all are 4 on [employer] list   Patient nonadherence -Discussed with patient the importance of taking all medications as prescribed especially any hypertensive medications -She is aware that current symptoms likely related to uncontrolled blood pressure and that if prolonged uncontrolled blood pressure persists can lead to a full-blown stroke -Also informed that poorly controlled blood pressure can lead to other problems such as cardiomyopathies and heart failure See above re $4   Code Status: full Family Communication: patient Disposition Plan:    Consultants:  neuro  Procedures:    HPI/Subjective: No SOB, no CP  Objective: Filed Vitals:   05/10/15 1343  BP: 141/72  Pulse: 66  Temp: 98.5 F (36.9 C)  Resp: 20   No intake or output data in the 24 hours ending 05/10/15 1357 Filed Weights   05/09/15 1000 05/09/15 1047  Weight: 89.8 kg (197 lb 15.6 oz) 89.8 kg (197 lb 15.6 oz)    Exam:   General:  Awake, NAD  Cardiovascular: rrr  Respiratory: clear  Abdomen: +BS, soft  Musculoskeletal: no edema   Data Reviewed: Basic Metabolic Panel:  Recent Labs Lab 05/09/15 1046 05/09/15 1523  NA 138 138  K 3.8 3.8  CL 104 107  CO2  --  25  GLUCOSE 105* 107*  BUN 9 8  CREATININE 0.80 0.85  CALCIUM  --  9.1   Liver Function Tests:  Recent Labs Lab 05/09/15 1523  AST 19  ALT 14  ALKPHOS 60  BILITOT  0.3  PROT 6.6  ALBUMIN 3.6   No results for input(s): LIPASE, AMYLASE in the last 168 hours. No results for input(s): AMMONIA in the last 168 hours. CBC:  Recent Labs Lab 05/09/15 1046 05/09/15 1523  WBC  --  6.9  NEUTROABS  --  4.7  HGB 12.9 11.0*  HCT 38.0 35.5*  MCV  --  74.0*  PLT  --  178   Cardiac Enzymes: No results for input(s): CKTOTAL, CKMB, CKMBINDEX, TROPONINI in the last 168 hours. BNP (last 3 results) No results for input(s): BNP in the last 8760 hours.  ProBNP (last 3 results) No results for input(s): PROBNP in the last 8760 hours.  CBG:  Recent Labs Lab 05/09/15 1052  GLUCAP 101*    No results found for this or any previous visit (from the past 240 hour(s)).   Studies: Dg Chest 2 View  05/09/2015   CLINICAL DATA:  TIA.  Current history of hypertension.  EXAM: CHEST  2 VIEW  COMPARISON:  11/15/2014.  FINDINGS: Cardiomediastinal silhouette unremarkable, unchanged. Focal opacity in the posterior inferior right upper lobe, unchanged, therefore consistent with scarring. Lungs otherwise clear. No localized airspace consolidation. No pleural effusions. No pneumothorax. Normal pulmonary vascularity. Visualized bony thorax intact.  IMPRESSION: Stable scarring involving the right upper lobe. No acute cardiopulmonary disease.   Electronically Signed   By: Hulan Saas M.D.   On: 05/09/2015 21:40   Ct Head Wo Contrast  05/09/2015   CLINICAL DATA:  Slurred speech, stroke  EXAM: CT HEAD WITHOUT CONTRAST  TECHNIQUE: Contiguous axial images were obtained from the base of the skull through the vertex without intravenous contrast.  COMPARISON:  09/30/2011  FINDINGS: No skull fracture is noted. Paranasal sinuses and mastoid air cells are unremarkable. No intracranial hemorrhage, mass effect or midline shift. No acute cortical infarction. No mass lesion is noted on this unenhanced scan. The gray and white-matter differentiation is preserved.  IMPRESSION: No acute  intracranial abnormality. No definite acute cortical infarction. These results were called by telephone at the time of interpretation on 05/09/2015 at 10:50 am to Dr. Thad Ranger , who verbally acknowledged these results.   Electronically Signed   By: Natasha Mead M.D.   On: 05/09/2015 10:51   Mr Shirlee Latch Wo Contrast  05/09/2015   CLINICAL DATA:  49 year old female with sudden onset slurred speech and left facial numbness at 0900 hrs. Most symptoms resolved. Elevated blood pressure, 197 systolic. Initial encounter.  EXAM: MRI HEAD WITHOUT CONTRAST  MRA HEAD WITHOUT CONTRAST  TECHNIQUE: Multiplanar, multiecho pulse sequences of the brain and surrounding structures were obtained without intravenous contrast. Angiographic images of the head were obtained using MRA technique without contrast.  COMPARISON:  Head CTs without contrast 1045 hr today and earlier.  FINDINGS: MRI HEAD FINDINGS  Partially empty sella configuration, stable since 2013. No restricted diffusion to suggest acute infarction. No midline shift, mass effect, evidence of mass lesion, ventriculomegaly, extra-axial collection or acute intracranial hemorrhage. Cervicomedullary junction are within normal limits.  Scattered and patchy bilateral cerebral white matter T2 and FLAIR hyperintensity in a nonspecific configuration, moderate for age, with some deep white matter capsule involvement. Superimposed mild to moderate for age deep gray matter T2 heterogeneity, most notably in both thalami. No cortical encephalomalacia or chronic cerebral blood products identified. Brainstem and cerebellum within normal limits.  Visible internal auditory structures appear normal. Mastoids and paranasal sinuses are clear. Negative scalp soft tissues. Negative visualized cervical spine. Visualized bone marrow signal is within normal limits. 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 Within normal limits. Major intracranial vascular flow voids  MRA HEAD FINDINGS  Fairly codominant distal vertebral arteries with antegrade flow in the posterior circulation. Normal PICA origins. Mildly tortuous vertebrobasilar junction. Tortuous basilar artery without stenosis. Normal SCA and PCA origins. Posterior communicating arteries are diminutive or absent. Bilateral PCA branches are within normal limits.  Antegrade flow in both ICA siphons. No siphon stenosis. Tortuous but otherwise normal left ophthalmic artery origin. Normal right ophthalmic artery origin. Normal carotid termini, MCA and ACA origins. Anterior communicating artery (appears fenestrated) and visualized bilateral ACA branches are within normal limits. MCA M1 segments and bifurcations are within normal limits. Visualized bilateral MCA branches are within normal limits.  IMPRESSION: 1. No acute intracranial abnormality. 2. Moderate for age cerebral white matter and some deep gray matter signal abnormality, nonspecific, but favor small vessel disease/hypertensive changes in this setting. 3. Chronic partially empty sella configuration, often a normal anatomic variant but can be associated with idiopathic intracranial hypertension (pseudotumor cerebri). 4.  Negative intracranial MRA.   Electronically Signed   By: Odessa Fleming M.D.   On: 05/09/2015 13:59   Mr Brain Wo Contrast  05/09/2015   CLINICAL DATA:  49 year old female with sudden onset slurred speech and left facial numbness at 0900 hrs. Most symptoms resolved. Elevated blood pressure, 197 systolic. Initial encounter.  EXAM: MRI HEAD WITHOUT CONTRAST  MRA HEAD WITHOUT CONTRAST  TECHNIQUE: Multiplanar,  multiecho pulse sequences of the brain and surrounding structures were obtained without intravenous contrast. Angiographic images of the head were obtained using MRA technique without contrast.  COMPARISON:  Head CTs without contrast 1045 hr today and earlier.  FINDINGS: MRI HEAD FINDINGS   Partially empty sella configuration, stable since 2013. No restricted diffusion to suggest acute infarction. No midline shift, mass effect, evidence of mass lesion, ventriculomegaly, extra-axial collection or acute intracranial hemorrhage. Cervicomedullary junction are within normal limits.  Scattered and patchy bilateral cerebral white matter T2 and FLAIR hyperintensity in a nonspecific configuration, moderate for age, with some deep white matter capsule involvement. Superimposed mild to moderate for age deep gray matter T2 heterogeneity, most notably in both thalami. No cortical encephalomalacia or chronic cerebral blood products identified. Brainstem and cerebellum within normal limits.  Visible internal auditory structures appear normal. Mastoids and paranasal sinuses are clear. Negative scalp soft tissues. Negative visualized cervical spine. Visualized bone marrow signal is within normal limits. 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 Within normal limits. Major intracranial vascular flow voids  MRA HEAD FINDINGS  Fairly codominant distal vertebral arteries with antegrade flow in the posterior circulation. Normal PICA origins. Mildly tortuous vertebrobasilar junction. Tortuous basilar artery without stenosis. Normal SCA and PCA origins. Posterior communicating arteries are diminutive or absent. Bilateral PCA branches are within normal limits.  Antegrade flow in both ICA siphons. No siphon stenosis. Tortuous but otherwise normal left ophthalmic artery origin. Normal right ophthalmic artery origin. Normal carotid termini, MCA and ACA origins. Anterior communicating artery (appears fenestrated) and visualized bilateral ACA branches are within normal limits. MCA M1 segments and bifurcations are within normal limits. Visualized bilateral MCA branches are within normal limits.  IMPRESSION: 1. No acute intracranial  abnormality. 2. Moderate for age cerebral white matter and some deep gray matter signal abnormality, nonspecific, but favor small vessel disease/hypertensive changes in this setting. 3. Chronic partially empty sella configuration, often a normal anatomic variant but can be associated with idiopathic intracranial hypertension (pseudotumor cerebri). 4.  Negative intracranial MRA.   Electronically Signed   By: Odessa Fleming M.D.   On: 05/09/2015 13:59    Scheduled Meds: . amLODipine  10 mg Oral Daily  . aspirin EC  325 mg Oral Daily  . atorvastatin  10 mg Oral q1800  . enoxaparin (LOVENOX) injection  40 mg Subcutaneous Q24H  . lisinopril  20 mg Oral Daily   And  . hydrochlorothiazide  25 mg Oral Daily  . metoprolol tartrate  25 mg Oral BID   Continuous Infusions:  Antibiotics Given (last 72 hours)    None      Principal Problem:   TIA (transient ischemic attack) Active Problems:   Uncontrolled hypertension   Patient nonadherence   Microcytic anemia    Time spent: 25 min    Ethelmae Ringel  [facility] Pager [phone]. If 7PM-7AM, please contact night-coverage at [email or web address], password [facility] 05/10/2015, 1:57 PM  LOS: 1 day

## 2015-05-10 NOTE — Progress Notes (Signed)
  Echocardiogram 2D Echocardiogram has been performed.  Delcie Roch 05/10/2015, 2:59 PM

## 2015-05-10 NOTE — Evaluation (Addendum)
Physical Therapy Evaluation Patient Details Name: Hannah Ellis MRN: [id number] DOB: 07-25-1966 Today's Date: 05/10/2015   History of Present Illness  49 y.o. female presenting with complaints of slurred speech and left facial numbness. BP poorly controlled. MRI and CT negative to date  Clinical Impression  Patient mobilizing well, independent with mobility. DGI score normal, no acute PT needs, will sign off.    Follow Up Recommendations No PT follow up    Equipment Recommendations  None recommended by PT    Recommendations for Other Services       Precautions / Restrictions        Mobility  Bed Mobility Overal bed mobility: Independent                Transfers Overall transfer level: Independent                  Ambulation/Gait Ambulation/Gait assistance: Independent Ambulation Distance (Feet): 640 Feet Assistive device: None Gait Pattern/deviations: WFL(Within Functional Limits)   Gait velocity interpretation: at or above normal speed for age/gender    Stairs Stairs: Yes Stairs assistance: Independent Stair Management: No rails Number of Stairs: 4    Wheelchair Mobility    Modified Rankin (Stroke Patients Only) Modified Rankin (Stroke Patients Only) Pre-Morbid Rankin Score: No symptoms Modified Rankin: No symptoms     Balance Overall balance assessment: No apparent balance deficits (not formally assessed)                               Standardized Balance Assessment Standardized Balance Assessment : Dynamic Gait Index   Dynamic Gait Index Level Surface: Normal Change in Gait Speed: Normal Gait with Horizontal Head Turns: Normal Gait with Vertical Head Turns: Normal Gait and Pivot Turn: Normal Step Over Obstacle: Normal Step Around Obstacles: Normal Steps: Normal Total Score: 24       Pertinent Vitals/Pain Pain Assessment: No/denies pain    Home Living Family/patient expects to be discharged to:: Private  residence Living Arrangements: Spouse/significant other Available Help at Discharge: Family Type of Home: House Home Access: Stairs to enter Entrance Stairs-Rails: None Secretary/administrator of Steps: 3 Home Layout: One level        Prior Function Level of Independence: Independent               Hand Dominance   Dominant Hand: Right    Extremity/Trunk Assessment   Upper Extremity Assessment: Overall WFL for tasks assessed           Lower Extremity Assessment: Overall WFL for tasks assessed         Communication   Communication:  (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)  Cognition Arousal/Alertness: Awake/alert Behavior During Therapy: WFL for tasks assessed/performed Overall Cognitive Status: Within Functional Limits for tasks assessed                      General Comments      Exercises        Assessment/Plan    PT Assessment Patent does not need any further PT services  PT Diagnosis Other (comment) (sensory deficits)   PT Problem List    PT Treatment Interventions     PT Goals (Current goals can be found in the Care Plan section) Acute Rehab PT Goals PT Goal Formulation: All assessment and education complete, DC therapy    Frequency     Barriers to discharge  Co-evaluation               End of Session   Activity Tolerance: Patient tolerated treatment well Patient left: in bed;with call bell/phone within reach Nurse Communication: Mobility status         Time: 1610-9604 PT Time Calculation (min) (ACUTE ONLY): 12 min   Charges:   PT Evaluation $Initial PT Evaluation Tier I: 1 Procedure     PT G Codes:   PT G-Codes **NOT FOR INPATIENT CLASS** Functional Assessment Tool Used: clinical judgement Functional Limitation: Mobility: Walking and moving around Mobility: Walking and Moving Around Current Status (V4098): 0 percent impaired, limited or restricted Mobility: Walking and Moving Around Goal Status (J1914): 0  percent impaired, limited or restricted Mobility: Walking and Moving Around Discharge Status [phone]): 0 percent impaired, limited or restricted    Fabio Asa 05/10/2015, 1:28 PM Charlotte Crumb, PT DPT  [phone]

## 2015-05-11 DIAGNOSIS — G451 Carotid artery syndrome (hemispheric): Secondary | ICD-10-CM

## 2015-05-11 DIAGNOSIS — Z9119 Patient's noncompliance with other medical treatment and regimen: Secondary | ICD-10-CM | POA: Diagnosis not present

## 2015-05-11 DIAGNOSIS — I10 Essential (primary) hypertension: Secondary | ICD-10-CM | POA: Diagnosis not present

## 2015-05-11 LAB — HEMOGLOBIN A1C
HEMOGLOBIN A1C: 5.9 % — AB (ref 4.8–5.6)
Mean Plasma Glucose: 123 mg/dL

## 2015-05-11 MED ORDER — LISINOPRIL 40 MG PO TABS: 40.0000 mg | ORAL_TABLET | Freq: Every day | ORAL | Status: DC

## 2015-05-11 MED ORDER — HYDRALAZINE HCL 10 MG PO TABS: 10.0000 mg | ORAL_TABLET | Freq: Three times a day (TID) | ORAL | Status: DC

## 2015-05-11 MED ORDER — HYDROCHLOROTHIAZIDE 25 MG PO TABS: 25.0000 mg | ORAL_TABLET | Freq: Every day | ORAL | Status: DC

## 2015-05-11 MED ORDER — METOPROLOL TARTRATE 25 MG PO TABS: 25.0000 mg | ORAL_TABLET | Freq: Two times a day (BID) | ORAL | Status: DC

## 2015-05-11 MED ORDER — ATORVASTATIN CALCIUM 10 MG PO TABS: 10.0000 mg | ORAL_TABLET | Freq: Every day | ORAL | Status: DC

## 2015-05-11 MED ORDER — ASPIRIN 325 MG PO TBEC: 325.0000 mg | DELAYED_RELEASE_TABLET | Freq: Every day | ORAL | Status: AC

## 2015-05-11 NOTE — Discharge Summary (Addendum)
Physician Discharge Summary  Hannah Ellis ZOX:[id number] DOB: 1965/12/14 DOA: 05/09/2015  PCP: No primary care provider on file.  Admit date: 05/09/2015 Discharge date: 05/11/2015  Time spent: 35 minutes  Recommendations for Outpatient Follow-up:  1. Further BP medication titration  Discharge Diagnoses:  Principal Problem:   TIA (transient ischemic attack) Active Problems:   Uncontrolled hypertension   Patient nonadherence   Microcytic anemia   Discharge Condition: improved  Diet recommendation: cardiac  Filed Weights   05/09/15 1000 05/09/15 1047  Weight: 89.8 kg (197 lb 15.6 oz) 89.8 kg (197 lb 15.6 oz)    History of present illness:  This is a 49 year old female patient with history of hypertension who currently admits nonadherence to taking antihypertensive medications correctly who presents with slurred speech and facial numbness patient reports that she was normal upon awakening and went to work as usual. After arrival to work she noticed her left face was numb and she had slurred speech. This was around 9 AM. [facility] the [facility] to [facility] [place] ER. By the time she arrived to the ER her speech had improved to nearly baseline but her facial numbness persisted.  In the ER patient was afebrile with marked systolic hypertension with a blood pressure reading of 190/87 which has since currently improved to 181/87, pulse rate 78 and respirations 18, room air saturations on percent. Laboratory data was unremarkable except for microcytic anemia with a hemoglobin of 11 and an MCV of 74. Troponin was 0.00, alcohol level was less than 5, urine drug screen was negative, CT of the head as well as MRI MRA of the brain was unremarkable.  Hospital Course:  TIA  -Begin full dose aspirin -lipid panel LDL> 70-- start statin  carotid duplex and echocardiogram ok- echo with grade 1 diastolic dsfxn -PT- no follow up -Suspect poorly controlled blood pressure contributing  to her current symptomatology   Uncontrolled hypertension -Secondary to patient nonadherence -patient claims she can not afford meds-- all are 4 on [employer] list for $4   Patient nonadherence -Discussed with patient the importance of taking all medications as prescribed especially any hypertensive medications -She is aware that current symptoms likely related to uncontrolled blood pressure and that if prolonged uncontrolled blood pressure persists can lead to a full-blown stroke -Also informed that poorly controlled blood pressure can lead to other problems such as cardiomyopathies and heart failure See above re $4  Procedures:  echo  Consultations:  neuro  Discharge Exam: Filed Vitals:   05/11/15 0528  BP: 138/94  Pulse: 68  Temp: 98.4 F (36.9 C)  Resp: 20    General: A+OX3, NAD   Discharge Instructions   Discharge Instructions    Ambulatory referral to Neurology    Complete by:  As directed   Pt will follow up with Dr. Roda Shutters at [facility] in about 2 months. Thanks.     Diet - low sodium heart healthy    Complete by:  As directed      Discharge instructions    Complete by:  As directed   BMP 4 weeks     Increase activity slowly    Complete by:  As directed           Current Discharge Medication List    START taking these medications   Details  aspirin EC 325 MG EC tablet Take 1 tablet (325 mg total) by mouth daily. Qty: 30 tablet, Refills: 0    atorvastatin (LIPITOR) 10 MG  tablet Take 1 tablet (10 mg total) by mouth daily at 6 PM. Qty: 30 tablet, Refills: 0    hydrALAZINE (APRESOLINE) 10 MG tablet Take 1 tablet (10 mg total) by mouth every 8 (eight) hours. Qty: 90 tablet, Refills: 0    hydrochlorothiazide (HYDRODIURIL) 25 MG tablet Take 1 tablet (25 mg total) by mouth daily. Qty: 30 tablet, Refills: 0    lisinopril (PRINIVIL,ZESTRIL) 40 MG tablet Take 1 tablet (40 mg total) by mouth daily. Qty: 30 tablet, Refills: 0      CONTINUE these medications which  have CHANGED   Details  metoprolol tartrate (LOPRESSOR) 25 MG tablet Take 1 tablet (25 mg total) by mouth 2 (two) times daily. Qty: 60 tablet, Refills: 0      STOP taking these medications     amLODipine (NORVASC) 10 MG tablet      lisinopril-hydrochlorothiazide (PRINZIDE,ZESTORETIC) 20-25 MG per tablet      omeprazole (PRILOSEC) 20 MG capsule        No Known Allergies Follow-up Information    Follow up with Xu,Jindong, MD. Schedule an appointment as soon as possible for a visit in 2 months.   Specialty:  Neurology   Why:  stroke clinic   Contact information:   [address] [place] [address] [phone]        The results of significant diagnostics from this hospitalization (including imaging, microbiology, ancillary and laboratory) are listed below for reference.    Significant Diagnostic Studies: Dg Chest 2 View  05/09/2015   CLINICAL DATA:  TIA.  Current history of hypertension.  EXAM: CHEST  2 VIEW  COMPARISON:  11/15/2014.  FINDINGS: Cardiomediastinal silhouette unremarkable, unchanged. Focal opacity in the posterior inferior right upper lobe, unchanged, therefore consistent with scarring. Lungs otherwise clear. No localized airspace consolidation. No pleural effusions. No pneumothorax. Normal pulmonary vascularity. Visualized bony thorax intact.  IMPRESSION: Stable scarring involving the right upper lobe. No acute cardiopulmonary disease.   Electronically Signed   By: Hulan Saas M.D.   On: 05/09/2015 21:40   Ct Head Wo Contrast  05/09/2015   CLINICAL DATA:  Slurred speech, stroke  EXAM: CT HEAD WITHOUT CONTRAST  TECHNIQUE: Contiguous axial images were obtained from the base of the skull through the vertex without intravenous contrast.  COMPARISON:  09/30/2011  FINDINGS: No skull fracture is noted. Paranasal sinuses and mastoid air cells are unremarkable. No intracranial hemorrhage, mass effect or midline shift. No acute cortical infarction. No  mass lesion is noted on this unenhanced scan. The gray and white-matter differentiation is preserved.  IMPRESSION: No acute intracranial abnormality. No definite acute cortical infarction. These results were called by telephone at the time of interpretation on 05/09/2015 at 10:50 am to Dr. Thad Ranger , who verbally acknowledged these results.   Electronically Signed   By: Natasha Mead M.D.   On: 05/09/2015 10:51   Mr Shirlee Latch Wo Contrast  05/09/2015   CLINICAL DATA:  49 year old female with sudden onset slurred speech and left facial numbness at 0900 hrs. Most symptoms resolved. Elevated blood pressure, 197 systolic. Initial encounter.  EXAM: MRI HEAD WITHOUT CONTRAST  MRA HEAD WITHOUT CONTRAST  TECHNIQUE: Multiplanar, multiecho pulse sequences of the brain and surrounding structures were obtained without intravenous contrast. Angiographic images of the head were obtained using MRA technique without contrast.  COMPARISON:  Head CTs without contrast 1045 hr today and earlier.  FINDINGS: MRI HEAD FINDINGS  Partially empty sella configuration, stable since 2013. No restricted diffusion to suggest  acute infarction. No midline shift, mass effect, evidence of mass lesion, ventriculomegaly, extra-axial collection or acute intracranial hemorrhage. Cervicomedullary junction are within normal limits.  Scattered and patchy bilateral cerebral white matter T2 and FLAIR hyperintensity in a nonspecific configuration, moderate for age, with some deep white matter capsule involvement. Superimposed mild to moderate for age deep gray matter T2 heterogeneity, most notably in both thalami. No cortical encephalomalacia or chronic cerebral blood products identified. Brainstem and cerebellum within normal limits.  Visible internal auditory structures appear normal. Mastoids and paranasal sinuses are clear. Negative scalp soft tissues. Negative visualized cervical spine. Visualized bone marrow signal is within normal limits. 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 Within normal limits. Major intracranial vascular flow voids  MRA HEAD FINDINGS  Fairly codominant distal vertebral arteries with antegrade flow in the posterior circulation. Normal PICA origins. Mildly tortuous vertebrobasilar junction. Tortuous basilar artery without stenosis. Normal SCA and PCA origins. Posterior communicating arteries are diminutive or absent. Bilateral PCA branches are within normal limits.  Antegrade flow in both ICA siphons. No siphon stenosis. Tortuous but otherwise normal left ophthalmic artery origin. Normal right ophthalmic artery origin. Normal carotid termini, MCA and ACA origins. Anterior communicating artery (appears fenestrated) and visualized bilateral ACA branches are within normal limits. MCA M1 segments and bifurcations are within normal limits. Visualized bilateral MCA branches are within normal limits.  IMPRESSION: 1. No acute intracranial abnormality. 2. Moderate for age cerebral white matter and some deep gray matter signal abnormality, nonspecific, but favor small vessel disease/hypertensive changes in this setting. 3. Chronic partially empty sella configuration, often a normal anatomic variant but can be associated with idiopathic intracranial hypertension (pseudotumor cerebri). 4.  Negative intracranial MRA.   Electronically Signed   By: Odessa Fleming M.D.   On: 05/09/2015 13:59   Mr Brain Wo Contrast  05/09/2015   CLINICAL DATA:  49 year old female with sudden onset slurred speech and left facial numbness at 0900 hrs. Most symptoms resolved. Elevated blood pressure, 197 systolic. Initial encounter.  EXAM: MRI HEAD WITHOUT CONTRAST  MRA HEAD WITHOUT CONTRAST  TECHNIQUE: Multiplanar, multiecho pulse sequences of the brain and surrounding structures were obtained without intravenous contrast. Angiographic images of the head were obtained using MRA  technique without contrast.  COMPARISON:  Head CTs without contrast 1045 hr today and earlier.  FINDINGS: MRI HEAD FINDINGS  Partially empty sella configuration, stable since 2013. No restricted diffusion to suggest acute infarction. No midline shift, mass effect, evidence of mass lesion, ventriculomegaly, extra-axial collection or acute intracranial hemorrhage. Cervicomedullary junction are within normal limits.  Scattered and patchy bilateral cerebral white matter T2 and FLAIR hyperintensity in a nonspecific configuration, moderate for age, with some deep white matter capsule involvement. Superimposed mild to moderate for age deep gray matter T2 heterogeneity, most notably in both thalami. No cortical encephalomalacia or chronic cerebral blood products identified. Brainstem and cerebellum within normal limits.  Visible internal auditory structures appear normal. Mastoids and paranasal sinuses are clear. Negative scalp soft tissues. Negative visualized cervical spine. Visualized bone marrow signal is within normal limits. 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 Within normal limits. Major intracranial vascular flow voids  MRA HEAD FINDINGS  Fairly codominant distal vertebral arteries with antegrade flow in the posterior circulation. Normal PICA origins. Mildly tortuous vertebrobasilar junction. Tortuous basilar artery without stenosis.  Normal SCA and PCA origins. Posterior communicating arteries are diminutive or absent. Bilateral PCA branches are within normal limits.  Antegrade flow in both ICA siphons. No siphon stenosis. Tortuous but otherwise normal left ophthalmic artery origin. Normal right ophthalmic artery origin. Normal carotid termini, MCA and ACA origins. Anterior communicating artery (appears fenestrated) and visualized bilateral ACA branches are within normal limits. MCA M1 segments and bifurcations  are within normal limits. Visualized bilateral MCA branches are within normal limits.  IMPRESSION: 1. No acute intracranial abnormality. 2. Moderate for age cerebral white matter and some deep gray matter signal abnormality, nonspecific, but favor small vessel disease/hypertensive changes in this setting. 3. Chronic partially empty sella configuration, often a normal anatomic variant but can be associated with idiopathic intracranial hypertension (pseudotumor cerebri). 4.  Negative intracranial MRA.   Electronically Signed   By: Odessa Fleming M.D.   On: 05/09/2015 13:59    Microbiology: No results found for this or any previous visit (from the past 240 hour(s)).   Labs: Basic Metabolic Panel:  Recent Labs Lab 05/09/15 1046 05/09/15 1523  NA 138 138  K 3.8 3.8  CL 104 107  CO2  --  25  GLUCOSE 105* 107*  BUN 9 8  CREATININE 0.80 0.85  CALCIUM  --  9.1   Liver Function Tests:  Recent Labs Lab 05/09/15 1523  AST 19  ALT 14  ALKPHOS 60  BILITOT 0.3  PROT 6.6  ALBUMIN 3.6   No results for input(s): LIPASE, AMYLASE in the last 168 hours. No results for input(s): AMMONIA in the last 168 hours. CBC:  Recent Labs Lab 05/09/15 1046 05/09/15 1523  WBC  --  6.9  NEUTROABS  --  4.7  HGB 12.9 11.0*  HCT 38.0 35.5*  MCV  --  74.0*  PLT  --  178   Cardiac Enzymes: No results for input(s): CKTOTAL, CKMB, CKMBINDEX, TROPONINI in the last 168 hours. BNP: BNP (last 3 results) No results for input(s): BNP in the last 8760 hours.  ProBNP (last 3 results) No results for input(s): PROBNP in the last 8760 hours.  CBG:  Recent Labs Lab 05/09/15 1052  GLUCAP 101*       Signed:  Tryphena Perkovich  [facility] 05/11/2015, 9:02 AM

## 2015-05-11 NOTE — Progress Notes (Signed)
D/C orders received, pt for D/C home today.  IV and telemetry D/C.  Rx and D/C instructions given with verbalized understanding.  Family at bedside to assist with D/C.  Staff brought pt downstairs via wheelchair.  

## 2015-05-30 ENCOUNTER — Telehealth: Payer: Self-pay | Admitting: Neurology

## 2015-05-30 NOTE — Telephone Encounter (Addendum)
Patient called stating she had been having tingling in her mouth intermittently since her stroke 05/09/15. In addition to that, today she had pain in the left arm. I relayed to her to get in contact with her PCP (which she said is an [facility]). She said ok. Patient is currently not a pt of [facility]. She has a NP appt with Dr Roda Shutters 07/10/15. She did not see him or Dr Pearlean Brownie in the hospital.

## 2015-05-30 NOTE — Telephone Encounter (Signed)
Rn call patient about her new symptoms since having a stroke 05-09-15. Pt has follow up appt in October 17 with Dr. Roda Shutters. Pt stated she is having tingling in her mouth and her left arm. Pt stated her arm was numb and tingling at times. Pt stated she does not feel weak. Pt stated the symptoms have started off an on since being discharge from the hospital. Nurse advise pt to go the nearest emergency room to get a work up for a stroke. Patient agreed with going to the nearest emergency room. Nurse told patient to call back with a update.

## 2015-07-10 ENCOUNTER — Ambulatory Visit (INDEPENDENT_AMBULATORY_CARE_PROVIDER_SITE_OTHER): Payer: BLUE CROSS/BLUE SHIELD | Admitting: Neurology

## 2015-07-10 ENCOUNTER — Encounter: Payer: Self-pay | Admitting: Neurology

## 2015-07-10 VITALS — BP 164/94 | HR 65 | Ht 64.0 in | Wt 193.4 lb

## 2015-07-10 DIAGNOSIS — I10 Essential (primary) hypertension: Secondary | ICD-10-CM | POA: Diagnosis not present

## 2015-07-10 DIAGNOSIS — F411 Generalized anxiety disorder: Secondary | ICD-10-CM | POA: Diagnosis not present

## 2015-07-10 DIAGNOSIS — I674 Hypertensive encephalopathy: Secondary | ICD-10-CM

## 2015-07-10 NOTE — Patient Instructions (Signed)
-   continue ASA and lipitor for stroke prevention - your episode 2 months ago likely to be related to stress or high BP - check BP at home twice a day and record and bring over to PCP - your iron was low during hospitalization, please follow up with PCP for iron supplement - Follow up with your primary care physician for stroke risk factor modification. Recommend maintain blood pressure goal 120-140, diabetes with hemoglobin A1c goal below 6.5% and lipids with LDL cholesterol goal below 100 mg/dL.  - follow up as needed.

## 2015-07-10 NOTE — Progress Notes (Signed)
STROKE NEUROLOGY FOLLOW UP NOTE  NAME: Hannah Ellis DOB: 04-05-1966  REASON FOR VISIT: stroke follow up HISTORY FROM: pt and chart   Today we had the pleasure of seeing Hannah Ellis in follow-up at our Neurology Clinic. Pt was accompanied by no one.   History Summary Hannah Ellis is a 49 y.o. female with history of hypertension, not compliant with prescribed medications presenting with left facial numbness and dysarthria. She did not receive IV t-PA due to minimal symptoms. Her symptoms quickly improved and resolved the second day. MRI, MRA, CUS and 2D echo unremarkable. A1C WNL and LDL 83. She admitted a lot of stress from family and at work. Event considered as anxiety vs. Hypertensive encephalopathy. Less likely TIA. She was discharged with ASA and liptor 10.   Interval History During the interval time, the patient has been doing well. No recurrent symptoms. Her BP in clinic today 169/94, but she stated at home BP stable at goal. She is on 4 different BP meds, including hydralazine, HCTZ, lisinopril, metoprolol.   REVIEW OF SYSTEMS: Full 14 system review of systems performed and notable only for those listed below and in HPI above, all others are negative:  Constitutional:   Cardiovascular:  Ear/Nose/Throat:   Skin:  Eyes:   Respiratory:   Gastroitestinal:   Genitourinary:  Hematology/Lymphatic:   Endocrine:  Musculoskeletal:   Allergy/Immunology:   Neurological:   Psychiatric: depression Sleep:   The following represents the patient's updated allergies and side effects list: No Known Allergies  The neurologically relevant items on the patient's problem list were reviewed on today's visit.  Neurologic Examination  A problem focused neurological exam (12 or more points of the single system neurologic examination, vital signs counts as 1 point, cranial nerves count for 8 points) was performed.  Blood pressure 164/94, pulse 65, height  (1.626 m),  weight 193 lb 6.4 oz (87.726 kg), last menstrual period 06/11/2015.  General - Well nourished, well developed, in no apparent distress.  Ophthalmologic - Sharp disc margins OU.   Cardiovascular - Regular rate and rhythm with no murmur.  Mental Status -  Level of arousal and orientation to time, place, and person were intact. Language including expression, naming, repetition, comprehension was assessed and found intact. Fund of Knowledge was assessed and was intact.  Cranial Nerves II - XII - II - Visual field intact OU. III, IV, VI - Extraocular movements intact. V - Facial sensation intact bilaterally. VII - Facial movement intact bilaterally. VIII - Hearing & vestibular intact bilaterally. X - Palate elevates symmetrically. XI - Chin turning & shoulder shrug intact bilaterally. XII - Tongue protrusion intact.  Motor Strength - The patient's strength was normal in all extremities and pronator drift was absent.  Bulk was normal and fasciculations were absent.   Motor Tone - Muscle tone was assessed at the neck and appendages and was normal.  Reflexes - The patient's reflexes were 1+ in all extremities and she had no pathological reflexes.  Sensory - Light touch, temperature/pinprick, vibration and proprioception, and Romberg testing were assessed and were normal.    Coordination - The patient had normal movements in the hands and feet with no ataxia or dysmetria.  Tremor was absent.  Gait and Station - The patient's transfers, posture, gait, station, and turns were observed as normal.  Data reviewed: I personally reviewed the images and agree with the radiology interpretations.  Dg Chest 2 View 05/09/2015 Stable scarring involving the right upper lobe. No  acute cardiopulmonary disease.   Ct Head Wo Contrast 05/09/2015 No acute intracranial abnormality. No definite acute cortical infarction.   Mr Hannah Ellis Head Wo Contrast 05/09/2015 Negative intracranial MRA.   Mr  Brain Wo Contrast 05/09/2015 1. No acute intracranial abnormality. 2. Moderate for age cerebral white matter and some deep gray matter signal abnormality, nonspecific, but favor small vessel disease/hypertensive changes in this setting. 3. Chronic partially empty sella configuration, often a normal anatomic variant but can be associated with idiopathic intracranial hypertension (pseudotumor cerebri).   CAROTID DOPPLER There is 1-39% bilateral ICA stenosis. Vertebral artery flow is antegrade.   TTE - - Left ventricle: The cavity size was normal. Systolic function was normal. The estimated ejection fraction was in the range of 60% to 65%. Wall motion was normal; there were no regional wall motion abnormalities. Doppler parameters are consistent with abnormal left ventricular relaxation (grade 1 diastolic dysfunction). - Mitral valve: Calcified annulus. - Left atrium: The atrium was mildly dilated. - Atrial septum: No defect or patent foramen ovale was identified.  Component     Latest Ref Rng 05/10/2015  Cholesterol     0 - 200 mg/dL 161  Triglycerides     <150 mg/dL 75  HDL Cholesterol     >40 mg/dL 49  Total CHOL/HDL Ratio      3.0  VLDL     0 - 40 mg/dL 15  LDL (calc)     0 - 99 mg/dL 83  Iron     28 - 096 ug/dL 32  TIBC     045 - 409 ug/dL 811 (H)  Saturation Ratios     10.4 - 31.8 % 6 (L)  UIBC      461  Retic Ct Pct     0.4 - 3.1 % 1.3  RBC.     3.87 - 5.11 MIL/uL 5.21 (H)  Retic Count, Manual     19.0 - 186.0 K/uL 67.7  Hemoglobin A1C     4.8 - 5.6 % 5.9 (H)  Mean Plasma Glucose      123  Vitamin B-12     180 - 914 pg/mL 487  Folate     >5.9 ng/mL 21.9  Ferritin     11 - 307 ng/mL 4 (L)  TSH     0.350 - 4.500 uIU/mL 4.279    Assessment: As you may recall, she is a 49 y.o. African American female with PMH of HTN was admitted on 05/09/15 for left facial numbness and slurry speech. BP was high on admission but symptoms resolved quickly. Stroke  work up all negative. Her symptoms likely to be hypertensive encephalopathy vs. Anxiety. TIA less likely but not able to ruled out. On ASA and lipitor. 4 BP meds for HTN management.   Plan:  - continue ASA and lipitor for stroke prevention - check BP at home twice a day and record and bring over to PCP - Follow up with PCP for iron supplement - Follow up with your primary care physician for stroke risk factor modification. Recommend maintain blood pressure goal 120-140, diabetes with hemoglobin A1c goal below 6.5% and lipids with LDL cholesterol goal below 100 mg/dL.  - follow up as needed.  No orders of the defined types were placed in this encounter.    No orders of the defined types were placed in this encounter.    Patient Instructions  - continue ASA and lipitor for stroke prevention - your episode 2 months ago likely to be  related to stress or high BP - check BP at home twice a day and record and bring over to PCP - your iron was low during hospitalization, please follow up with PCP for iron supplement - Follow up with your primary care physician for stroke risk factor modification. Recommend maintain blood pressure goal 120-140, diabetes with hemoglobin A1c goal below 6.5% and lipids with LDL cholesterol goal below 100 mg/dL.  - follow up as needed.    Marvel PlanJindong Aracelys Glade, MD PhD [facility] [address], [address] [place], [place] [id number] [phone]

## 2015-07-11 DIAGNOSIS — I10 Essential (primary) hypertension: Secondary | ICD-10-CM | POA: Insufficient documentation

## 2015-07-11 DIAGNOSIS — I674 Hypertensive encephalopathy: Secondary | ICD-10-CM | POA: Insufficient documentation

## 2015-07-11 DIAGNOSIS — F411 Generalized anxiety disorder: Secondary | ICD-10-CM | POA: Insufficient documentation

## 2016-04-05 ENCOUNTER — Encounter (HOSPITAL_COMMUNITY): Payer: Self-pay | Admitting: Emergency Medicine

## 2016-04-05 ENCOUNTER — Emergency Department (HOSPITAL_COMMUNITY)
Admission: EM | Admit: 2016-04-05 | Discharge: 2016-04-06 | Disposition: A | Discharge status: Home / Self Care | Payer: BLUE CROSS/BLUE SHIELD | Attending: Emergency Medicine | Admitting: Emergency Medicine

## 2016-04-05 ENCOUNTER — Emergency Department (HOSPITAL_COMMUNITY): Payer: BLUE CROSS/BLUE SHIELD

## 2016-04-05 DIAGNOSIS — Z87891 Personal history of nicotine dependence: Secondary | ICD-10-CM | POA: Insufficient documentation

## 2016-04-05 DIAGNOSIS — J189 Pneumonia, unspecified organism: Secondary | ICD-10-CM | POA: Insufficient documentation

## 2016-04-05 DIAGNOSIS — Z7982 Long term (current) use of aspirin: Secondary | ICD-10-CM | POA: Insufficient documentation

## 2016-04-05 DIAGNOSIS — Z8673 Personal history of transient ischemic attack (TIA), and cerebral infarction without residual deficits: Secondary | ICD-10-CM | POA: Insufficient documentation

## 2016-04-05 DIAGNOSIS — I10 Essential (primary) hypertension: Secondary | ICD-10-CM | POA: Insufficient documentation

## 2016-04-05 DIAGNOSIS — Z79899 Other long term (current) drug therapy: Secondary | ICD-10-CM | POA: Insufficient documentation

## 2016-04-05 DIAGNOSIS — R079 Chest pain, unspecified: Secondary | ICD-10-CM | POA: Diagnosis present

## 2016-04-05 LAB — CBC
HEMATOCRIT: 35.6 % — AB (ref 36.0–46.0)
Hemoglobin: 11.1 g/dL — ABNORMAL LOW (ref 12.0–15.0)
MCH: 23.2 pg — ABNORMAL LOW (ref 26.0–34.0)
MCHC: 31.2 g/dL (ref 30.0–36.0)
MCV: 74.3 fL — ABNORMAL LOW (ref 78.0–100.0)
PLATELETS: 223 10*3/uL (ref 150–400)
RBC: 4.79 MIL/uL (ref 3.87–5.11)
RDW: 16.8 % — AB (ref 11.5–15.5)
WBC: 9.7 10*3/uL (ref 4.0–10.5)

## 2016-04-05 LAB — I-STAT TROPONIN, ED: Troponin i, poc: 0.02 ng/mL (ref 0.00–0.08)

## 2016-04-05 LAB — BASIC METABOLIC PANEL
Anion gap: 5 (ref 5–15)
BUN: 12 mg/dL (ref 6–20)
CO2: 24 mmol/L (ref 22–32)
CREATININE: 0.86 mg/dL (ref 0.44–1.00)
Calcium: 9.4 mg/dL (ref 8.9–10.3)
Chloride: 109 mmol/L (ref 101–111)
Glucose, Bld: 91 mg/dL (ref 65–99)
POTASSIUM: 3.6 mmol/L (ref 3.5–5.1)
SODIUM: 138 mmol/L (ref 135–145)

## 2016-04-05 MED ORDER — LEVOFLOXACIN 750 MG PO TABS: 750.0000 mg | ORAL_TABLET | Freq: Once | ORAL | Status: DC

## 2016-04-05 MED ORDER — AMLODIPINE BESYLATE 5 MG PO TABS
10.0000 mg | ORAL_TABLET | Freq: Once | ORAL | Status: AC
  Administered 2016-04-05: 10 mg via ORAL
  Filled 2016-04-05: qty 2

## 2016-04-05 MED ORDER — AMLODIPINE BESYLATE 10 MG PO TABS: 10.0000 mg | ORAL_TABLET | Freq: Every day | ORAL | Status: DC

## 2016-04-05 MED ORDER — LEVOFLOXACIN 750 MG PO TABS
750.0000 mg | ORAL_TABLET | Freq: Once | ORAL | Status: AC
  Administered 2016-04-05: 750 mg via ORAL
  Filled 2016-04-05: qty 1

## 2016-04-05 NOTE — ED Notes (Signed)
Pt states she has had a cough for 3 days. Pt states when she coughs she "can taste blood". Pt states she was driving herself to [facility] and started having central sharp chest pains. Pt sent here for chest pain and also for being hypertensive.

## 2016-04-05 NOTE — ED Notes (Signed)
Dr. Oni at bedside. 

## 2016-04-05 NOTE — ED Provider Notes (Signed)
History  By signing my name below, I, Earmon Phoenix, attest that this documentation has been prepared under the direction and in the presence of Tomasita Crumble, MD. Electronically Signed: Earmon Phoenix, ED Scribe. 04/05/2016. 11:27 PM.  Chief Complaint  Patient presents with  . Chest Pain   The history is provided by the patient and medical records. No language interpreter was used.    HPI Comments:  Hannah Ellis is a 50 y.o. female with PMHx of HTN and stroke sent from [facility] (her PCP) who presents to the Emergency Department complaining of CP and HTN that began earlier today. She reports she also presented to [facility] for coughing and tasting blood for the past two days. She reports associated bruising of the posterior lower extremities but denies any trauma. She reports she takes all her HTN medications as directed but her BP stays elevated. She has not taken or done anything to treat her symptoms. She denies modifying factors. She denies any known sick contacts. She denies swelling or pain in BLE. She states she was treated with antibiotics from her PCP last month for pneumonia. She denies h/o DVT or PE.   Past Medical History  Diagnosis Date  . Hypertension   . GERD (gastroesophageal reflux disease)   . Depression   . TIA (transient ischemic attack) 05/09/2015  . Stroke (HCC)   . 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 Eye surgery Left ~ 1972  . Foot surgery Right     "took some bone out of pinky and one next to it"  . Appendectomy    . Cesarean section  1998  . Tubal ligation  1998   Family History  Problem Relation Age of Onset  . Hypertension Mother   . Colon cancer Mother   . Hypertension Father   . Hypertension Brother    Social History  Substance Use Topics  . Smoking status: Former Smoker -- 3 years    Types: Cigarettes  . Smokeless tobacco: Never Used     Comment: "quit  smoking cigarettes when I was 17"  . Alcohol Use: No   OB History    No data available     Review of Systems A complete 10 system review of systems was obtained and all systems are negative except as noted in the HPI and PMH.   Allergies  Review of patient's allergies indicates no known allergies.  Home Medications   Prior to Admission medications   Medication Sig Start Date End Date Taking? Authorizing Provider  amLODipine (NORVASC) 10 MG tablet Take 1 tablet (10 mg total) by mouth daily. 04/05/16   Tomasita Crumble, MD  aspirin EC 325 MG EC tablet Take 1 tablet (325 mg total) by mouth daily. 05/11/15   Joseph Art, DO  atorvastatin (LIPITOR) 10 MG tablet Take 1 tablet (10 mg total) by mouth daily at 6 PM. 05/11/15   Joseph Art, DO  hydrALAZINE (APRESOLINE) 10 MG tablet Take 1 tablet (10 mg total) by mouth every 8 (eight) hours. 05/11/15   Joseph Art, DO  hydrochlorothiazide (HYDRODIURIL) 25 MG tablet Take 1 tablet (25 mg total) by mouth daily. 05/11/15   Joseph Art, DO  levofloxacin (LEVAQUIN) 750 MG tablet Take 1 tablet (750 mg total) by mouth once. 04/05/16   Tomasita Crumble, MD  lisinopril (PRINIVIL,ZESTRIL) 40 MG tablet Take 1 tablet (40 mg total) by  mouth daily. 05/11/15   Joseph ArtJessica U Vann, DO   Triage Vitals: BP 201/120 mmHg  Pulse 84  Temp(Src) 99 F (37.2 C) (Oral)  Resp 16  Ht 5\' 4"  (1.626 m)  Wt 193 lb (87.544 kg)  BMI 33.11 kg/m2  SpO2 99%  LMP 03/22/2016 Physical Exam  Constitutional: She is oriented to person, place, and time. She appears well-developed and well-nourished. No distress.  HENT:  Head: Normocephalic and atraumatic.  Nose: Nose normal.  Mouth/Throat: Oropharynx is clear and moist. No oropharyngeal exudate.  Eyes: Conjunctivae and EOM are normal. Pupils are equal, round, and reactive to light. No scleral icterus.  Neck: Normal range of motion. Neck supple. No JVD present. No tracheal deviation present. No thyromegaly present.  Cardiovascular: Normal  rate, regular rhythm and normal heart sounds.  Exam reveals no gallop and no friction rub.   No murmur heard. Pulmonary/Chest: Effort normal and breath sounds normal. No respiratory distress. She has no wheezes. She exhibits no tenderness.  Abdominal: Soft. Bowel sounds are normal. She exhibits no distension and no mass. There is no tenderness. There is no rebound and no guarding.  Musculoskeletal: Normal range of motion. She exhibits no edema or tenderness.  Lymphadenopathy:    She has no cervical adenopathy.  Neurological: She is alert and oriented to person, place, and time. No cranial nerve deficit. She exhibits normal muscle tone.  Skin: Skin is warm and dry. No rash noted. No erythema. No pallor.  Nursing note and vitals reviewed.   ED Course  Procedures (including critical care time) DIAGNOSTIC STUDIES: Oxygen Saturation is 99% on RA, normal by my interpretation.   COORDINATION OF CARE: 11:09 PM- Will prescribe antibiotics and d/c metoprolol. Will prescribe Norvasc. Pt verbalizes understanding and agrees to plan.  Medications  levofloxacin (LEVAQUIN) tablet 750 mg (not administered)  amLODipine (NORVASC) tablet 10 mg (not administered)    Labs Review Labs Reviewed  CBC - Abnormal; Notable for the following:    Hemoglobin 11.1 (*)    HCT 35.6 (*)    MCV 74.3 (*)    MCH 23.2 (*)    RDW 16.8 (*)    All other components within normal limits  BASIC METABOLIC PANEL  Rosezena SensorI-STAT TROPOININ, ED    Imaging Review Dg Chest 2 View  04/05/2016  CLINICAL DATA:  Chest pain for 3 days. EXAM: CHEST  2 VIEW COMPARISON:  Two-view chest x-ray 05/09/2015. FINDINGS: The heart size is normal. Superior segment right lower lobe and right upper lobe pneumonia is noted. There is mild pulmonary vascular congestion is well. There is no significant pleural or pericardial effusion. The visualized soft tissues and bony thorax are unremarkable. IMPRESSION: 1. Superior segment right lower lobe and right  upper lobe pneumonia. 2. Mild bilateral pulmonary vascular congestion without frank edema. Electronically Signed   By: Marin Robertshristopher  Mattern M.D.   On: 04/05/2016 20:18   I have personally reviewed and evaluated these images and lab results as part of my medical decision-making.   EKG Interpretation   Date/Time:  Friday April 05 2016 19:50:43 EDT Ventricular Rate:  81 PR Interval:  146 QRS Duration: 98 QT Interval:  396 QTC Calculation: 460 R Axis:   19 Text Interpretation:  Sinus rhythm with Premature atrial complexes Cannot  rule out Anterior infarct , age undetermined Abnormal ECG PACs now present  Confirmed by Erroll Lunani, Keelyn Monjaras Ayokunle [phone] on 04/05/2016 11:01:25 PM      MDM   Final diagnoses:  CAP (community acquired pneumonia)  Essential  hypertension    Patient presents to the ED for cough where she can taste some blood.  CXR reveals a pneumonia, which she states she was treated for last month.  She does not know what abx she was given, likely azithromycin.  Will treat with levoquin this time.  Also she needs further BP control.  She is on metoprolol which is not a great anti-HTN.  There is no history of a fib or arrhythmia. Will switch to norvasc and advise PCP fu within 3 days for BP check.  She is asymptomatic from her HTN.  She appears well and in NAD. VS remain within her normal limits and she is safe for DC.   I personally performed the services described in this documentation, which was scribed in my presence. The recorded information has been reviewed and is accurate.       Tomasita Crumble, MD 04/06/16 0001

## 2016-04-05 NOTE — Discharge Instructions (Signed)
Community-Acquired Pneumonia, Adult Hannah Ellis, take antibiotics as directed for your pneumonia.  Also stop taking metoprolol and replace it with amlodipine to help with your blood pressure.  See a primary care doctor within 3 days for close follow up.  If any symptoms worsen, come back to the ED immediately. Thank you.  Pneumonia is an infection of the lungs. One type of pneumonia can happen while a person is in a hospital. A different type can happen when a person is not in a hospital (community-acquired pneumonia). It is easy for this kind to spread from person to person. It can spread to you if you breathe near an infected person who coughs or sneezes. Some symptoms include:  A dry cough.  A wet (productive) cough.  Fever.  Sweating.  Chest pain. HOME CARE  Take over-the-counter and prescription medicines only as told by your doctor.  Only take cough medicine if you are losing sleep.  If you were prescribed an antibiotic medicine, take it as told by your doctor. Do not stop taking the antibiotic even if you start to feel better.  Sleep with your head and neck raised (elevated). You can do this by putting a few pillows under your head, or you can sleep in a recliner.  Do not use tobacco products. These include cigarettes, chewing tobacco, and e-cigarettes. If you need help quitting, ask your doctor.  Drink enough water to keep your pee (urine) clear or pale yellow. A shot (vaccine) can help prevent pneumonia. Shots are often suggested for:  People older than 50 years of age.  People older than 50 years of age:  Who are having cancer treatment.  Who have long-term (chronic) lung disease.  Who have problems with their body's defense system (immune system). You may also prevent pneumonia if you take these actions:  Get the flu (influenza) shot every year.  Go to the dentist as often as told.  Wash your hands often. If soap and water are not available, use hand  sanitizer. GET HELP IF:  You have a fever.  You lose sleep because your cough medicine does not help. GET HELP RIGHT AWAY IF:  You are short of breath and it gets worse.  You have more chest pain.  Your sickness gets worse. This is very serious if:  You are an older adult.  Your body's defense system is weak.  You cough up blood.   This information is not intended to replace advice given to you by your health care provider. Make sure you discuss any questions you have with your health care provider.   Document Released: 02/26/2008 Document Revised: 05/31/2015 Document Reviewed: 01/04/2015 Elsevier Interactive Patient Education 2016 [employer] Inc[employer] Managing Your High Blood Pressure Blood pressure is a measurement of how forceful your blood is pressing against the walls of the arteries. Arteries are muscular tubes within the circulatory system. Blood pressure does not stay the same. Blood pressure rises when you are active, excited, or nervous; and it lowers during sleep and relaxation. If the numbers measuring your blood pressure stay above normal most of the time, you are at risk for health problems. High blood pressure (hypertension) is a long-term (chronic) condition in which blood pressure is elevated. A blood pressure reading is recorded as two numbers, such as 120 over 80 (or 120/80). The first, higher number is called the systolic pressure. It is a measure of the pressure in your arteries as the heart beats. The second, lower number is called the  diastolic pressure. It is a measure of the pressure in your arteries as the heart relaxes between beats.  Keeping your blood pressure in a normal range is important to your overall health and prevention of health problems, such as heart disease and stroke. When your blood pressure is uncontrolled, your heart has to work harder than normal. High blood pressure is a very common condition in adults because blood pressure tends to rise with age.  Men and women are equally likely to have hypertension but at different times in life. Before age 67, men are more likely to have hypertension. After 50 years of age, women are more likely to have it. Hypertension is especially common in African Americans. This condition often has no signs or symptoms. The cause of the condition is usually not known. Your caregiver can help you come up with a plan to keep your blood pressure in a normal, healthy range. BLOOD PRESSURE STAGES Blood pressure is classified into four stages: normal, prehypertension, stage 1, and stage 2. Your blood pressure reading will be used to determine what type of treatment, if any, is necessary. Appropriate treatment options are tied to these four stages:  Normal  Systolic pressure (mm Hg): below 120.  Diastolic pressure (mm Hg): below 80. Prehypertension  Systolic pressure (mm Hg): 120 to 139.  Diastolic pressure (mm Hg): 80 to 89. Stage1  Systolic pressure (mm Hg): 140 to 159.  Diastolic pressure (mm Hg): 90 to 99. Stage2  Systolic pressure (mm Hg): 160 or above.  Diastolic pressure (mm Hg): 100 or above. RISKS RELATED TO HIGH BLOOD PRESSURE Managing your blood pressure is an important responsibility. Uncontrolled high blood pressure can lead to:  A heart attack.  A stroke.  A weakened blood vessel (aneurysm).  Heart failure.  Kidney damage.  Eye damage.  Metabolic syndrome.  Memory and concentration problems. HOW TO MANAGE YOUR BLOOD PRESSURE Blood pressure can be managed effectively with lifestyle changes and medicines (if needed). Your caregiver will help you come up with a plan to bring your blood pressure within a normal range. Your plan should include the following: Education  Read all information provided by your caregivers about how to control blood pressure.  Educate yourself on the latest guidelines and treatment recommendations. New research is always being done to further define the  risks and treatments for high blood pressure. Lifestylechanges  Control your weight.  Avoid smoking.  Stay physically active.  Reduce the amount of salt in your diet.  Reduce stress.  Control any chronic conditions, such as high cholesterol or diabetes.  Reduce your alcohol intake. Medicines  Several medicines (antihypertensive medicines) are available, if needed, to bring blood pressure within a normal range. Communication  Review all the medicines you take with your caregiver because there may be side effects or interactions.  Talk with your caregiver about your diet, exercise habits, and other lifestyle factors that may be contributing to high blood pressure.  See your caregiver regularly. Your caregiver can help you create and adjust your plan for managing high blood pressure. RECOMMENDATIONS FOR TREATMENT AND FOLLOW-UP  The following recommendations are based on current guidelines for managing high blood pressure in nonpregnant adults. Use these recommendations to identify the proper follow-up period or treatment option based on your blood pressure reading. You can discuss these options with your caregiver.  Systolic pressure of 120 to 139 or diastolic pressure of 80 to 89: Follow up with your caregiver as directed.  Systolic pressure of 140 to  160 or diastolic pressure of 90 to 100: Follow up with your caregiver within 2 months.  Systolic pressure above 160 or diastolic pressure above 100: Follow up with your caregiver within 1 month.  Systolic pressure above 180 or diastolic pressure above 110: Consider antihypertensive therapy; follow up with your caregiver within 1 week.  Systolic pressure above 200 or diastolic pressure above 120: Begin antihypertensive therapy; follow up with your caregiver within 1 week.   This information is not intended to replace advice given to you by your health care provider. Make sure you discuss any questions you have with your health care  provider.   Document Released: 06/03/2012 Document Reviewed: 06/03/2012 Elsevier Interactive Patient Education [employer]

## 2016-06-24 LAB — PULMONARY FUNCTION TEST

## 2016-10-01 ENCOUNTER — Telehealth: Payer: Self-pay | Admitting: Emergency Medicine

## 2016-10-01 NOTE — Telephone Encounter (Signed)
Rec'd from [facility] forward 62 pages to Dr.Burns

## 2016-10-15 ENCOUNTER — Encounter: Payer: Self-pay | Admitting: Emergency Medicine

## 2016-10-15 ENCOUNTER — Ambulatory Visit (INDEPENDENT_AMBULATORY_CARE_PROVIDER_SITE_OTHER): Payer: BLUE CROSS/BLUE SHIELD | Admitting: Emergency Medicine

## 2016-10-15 VITALS — BP 160/84 | HR 84 | Ht 64.0 in | Wt 204.6 lb

## 2016-10-15 DIAGNOSIS — D869 Sarcoidosis, unspecified: Secondary | ICD-10-CM | POA: Diagnosis not present

## 2016-10-15 NOTE — Progress Notes (Signed)
Subjective:    Patient ID: Hannah Ellis, female    DOB: 1966/06/17, 51 y.o.   MRN: [id number]  HPI 51 year old woman with sarcoidosis that was diagnosed by bronchoscopy in 06/2016. A review of her office notes from [facility] indicate that she had diffuse bilateral infiltrates on Ct chest in September prompting the evaluation. She was treated with corticosteroids and she remains on prednisone 10 mg daily. A repeat CT chest was done 09/09/16 that showed large scale resolution of her infiltrates but a residual spiculated nodule in the Rt mid lung. I personally reviewed the films.   Spirometry from 06/2016 >> normal airflows, no BD response.   She has noticed some L facial tingling and tremor that are new over the last 2 weeks. Never a rash. Her breathing has improved since she was treated with steroids.    Review of Systems  Constitutional: Negative.  Negative for fever and unexpected weight change.  HENT: Negative.  Negative for congestion, dental problem, ear pain, nosebleeds, postnasal drip, rhinorrhea, sinus pressure, sneezing, sore throat and trouble swallowing.   Eyes: Negative.  Negative for redness and itching.  Respiratory: Positive for cough, shortness of breath and wheezing. Negative for chest tightness.   Cardiovascular: Negative.  Negative for palpitations and leg swelling.  Gastrointestinal: Negative.  Negative for nausea and vomiting.  Genitourinary: Negative.  Negative for dysuria.  Musculoskeletal: Negative.  Negative for joint swelling.  Skin: Negative for rash.  Neurological: Negative.  Negative for headaches.  Hematological: Negative.  Does not bruise/bleed easily.  Psychiatric/Behavioral: Negative.  Negative for dysphoric mood. The patient is not nervous/anxious.     Past Medical History:  Diagnosis Date  . Depression   . GERD (gastroesophageal reflux disease)   . Hypertension   . Stroke (HCC)   . TIA (transient ischemic attack) 05/09/2015  . 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 Hypertension Mother   . Colon cancer Mother   . Hypertension Father   . Hypertension Brother      Social History   Social History  . Marital status: Married    Spouse name: N/A  . Number of children: N/A  . Years of education: N/A   Occupational History  . Not on file.   Social History Main Topics  . Smoking status: Never Smoker  . Smokeless tobacco: Never Used  . Alcohol use No  . Drug use: No  . Sexual activity: Yes   Other Topics Concern  . Not on file   Social History Narrative  . No narrative on file  [place] in [place], originally from [place].  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   No Known Allergies   Outpatient Medications Prior to Visit  Medication Sig Dispense Refill  . amLODipine (NORVASC) 10 MG tablet Take 1 tablet (10 mg total) by mouth daily. 30 tablet 0  . aspirin EC 325 MG EC tablet Take 1 tablet (325 mg total) by mouth daily. 30 tablet 0  . atorvastatin (LIPITOR) 10 MG tablet Take 1 tablet (10 mg total) by mouth daily at 6 PM. 30 tablet 0  . hydrALAZINE (APRESOLINE) 10 MG tablet Take 1 tablet (10 mg total) by mouth every 8 (eight) hours. 90 tablet 0  . hydrochlorothiazide (HYDRODIURIL) 25 MG tablet Take 1 tablet (25 mg total) by mouth daily. 30 tablet 0  . lisinopril (PRINIVIL,ZESTRIL) 40 MG tablet  Take 1 tablet (40 mg total) by mouth daily. 30 tablet 0  . levofloxacin (LEVAQUIN) 750 MG tablet Take 1 tablet (750 mg total) by mouth once. (Patient not taking: Reported on 10/15/2016) 4 tablet 0  . metoprolol tartrate (LOPRESSOR) 25 MG tablet Take 25 mg by mouth 2 (two) times daily.     No facility-administered medications prior to visit.         Objective:   Physical Exam Vitals:   10/15/16 1608  BP: (!) 160/84  Pulse: 84  SpO2: 99%  Weight: 204 lb 9.6 oz (92.8 kg)  Height: 5\' 4"  (1.626 m)   Gen: Pleasant, well-nourished, in no  distress,  normal affect  ENT: No lesions,  mouth clear,  oropharynx clear, no postnasal drip  Neck: No JVD, no TMG, no carotid bruits  Lungs: No use of accessory muscles, clear without rales or rhonchi  Cardiovascular: RRR, heart sounds normal, no murmur or gallops, no peripheral edema  Musculoskeletal: No deformities, no cyanosis or clubbing  Neuro: alert, non focal  Skin: Warm, no lesions or rashes     Assessment & Plan:  Sarcoidosis (HCC) With B infiltrates now largely resolved on CT scan of the chest that I have reviewed from 09/09/16. Is a residual nodular area in the right midlung. This will need to be followed with a repeat CT scan in 3 months to ensure stability. I am most suspicious that that is left over granulomatous disease. Suspicion for malignancy here is low. She is having some left-sided facial twitching etiology unclear. I believe she will likely need a neurology consult if this persists. I do not want to decrease her prednisone to 0 until this has been sorted out. She does have a hx TIA.   Levy Pupa, MD, PhD 10/15/2016, 5:37 PM [facility] [phone] or if no answer [phone]

## 2016-10-15 NOTE — Assessment & Plan Note (Signed)
With B infiltrates now largely resolved on CT scan of the chest that I have reviewed from 09/09/16. Is a residual nodular area in the right midlung. This will need to be followed with a repeat CT scan in 3 months to ensure stability. I am most suspicious that that is left over granulomatous disease. Suspicion for malignancy here is low. She is having some left-sided facial twitching etiology unclear. I believe she will likely need a neurology consult if this persists. I do not want to decrease her prednisone to 0 until this has been sorted out. She does have a hx TIA.

## 2016-10-15 NOTE — Patient Instructions (Addendum)
We will repeat your CT scan of the chest in mid March 2018 to compare with prior.  Please take the disc from your original CT scan of the chest with you to that visit so it can be sent for comparison. Follow with Dr Delton CoombesByrum in 3 months to review the CT scan, or sooner if you have any problems. Continue prednisone 10mg  daily for now. We will consider decreasing as we go forward.

## 2016-10-29 ENCOUNTER — Encounter: Payer: Self-pay | Admitting: Neurology

## 2016-10-29 ENCOUNTER — Ambulatory Visit (INDEPENDENT_AMBULATORY_CARE_PROVIDER_SITE_OTHER): Payer: BLUE CROSS/BLUE SHIELD | Admitting: Neurology

## 2016-10-29 VITALS — BP 159/94 | HR 79 | Ht 64.0 in | Wt 204.0 lb

## 2016-10-29 DIAGNOSIS — G513 Clonic hemifacial spasm: Secondary | ICD-10-CM | POA: Diagnosis not present

## 2016-10-29 DIAGNOSIS — D869 Sarcoidosis, unspecified: Secondary | ICD-10-CM

## 2016-10-29 DIAGNOSIS — I10 Essential (primary) hypertension: Secondary | ICD-10-CM

## 2016-10-29 DIAGNOSIS — G5139 Clonic hemifacial spasm, unspecified: Secondary | ICD-10-CM

## 2016-10-29 NOTE — Patient Instructions (Addendum)
-   continue ASA and lipitor - continue prednisone as instructed - will check MRI brain with and without contrast to rule out neuro sarcoidosis - will do EEG to rule out seizure, but low suspicious - will do blood draw today - will decide treatment regimen after MRI and EEG - follow up with PCP - check BP at home and record - follow up in 4 months with me.

## 2016-10-29 NOTE — Progress Notes (Signed)
STROKE NEUROLOGY FOLLOW UP NOTE  NAME: Hannah Ellis DOB: 1965-10-08  REASON FOR VISIT: stroke follow up HISTORY FROM: pt and chart   Today we had the pleasure of seeing Hannah Ellis in follow-up at our Neurology Clinic. Pt was accompanied by no one.   History Summary Hannah Ellis is a 51 y.o. female with history of hypertension, not compliant with prescribed medications presenting with left facial numbness and dysarthria. Hannah Ellis did not receive IV t-PA due to minimal symptoms. Her symptoms quickly improved and resolved the second day. MRI, MRA, CUS and 2D echo unremarkable. A1C WNL and LDL 83. Hannah Ellis admitted a lot of stress from family and at work. Event considered as anxiety vs. Hypertensive encephalopathy. Less likely TIA. Hannah Ellis was discharged with ASA and liptor 10.   07/10/15 follow up - the patient has been doing well. No recurrent symptoms. Her BP in clinic today 169/94, but Hannah Ellis stated at home BP stable at goal. Hannah Ellis is on 4 different BP meds, including hydralazine, HCTZ, lisinopril, metoprolol.   Interval History During the interval time, pt has been doing well generally. However, Hannah Ellis had diffuse bilateral infiltrates on Ct chest in 05/2015 and was later diagnosed with Sarcoidosis by bronchoscopy in 06/2016. Hannah Ellis was put on prednisone 40mg  x 2 weeks and then 30mg  2 weeks...Marland KitchenMarland KitchenMarland Kitchen And now on prednisone 10mg  daily. Repeat CT chest on 09/09/16 showed large scale resolution of her infiltrates but a residual spiculated nodule in the Rt mid lung. Hannah Ellis is following with Dr. Delton Coombes and continued on steroids.   About 2 weeks ago, Hannah Ellis stated to have left facial twitching. It is mild but involving left face and left eyelids, lasting 2 min or less each time, however, on and off, lasting all day long. It happens everyday and no time difference during the day or night. Hannah Ellis denies facial pain, speech or swallow problem, no vision changes, no numbness or weakness. Hannah Ellis becomes worried and seek for  further evaluation.    REVIEW OF SYSTEMS: Full 14 system review of systems performed and notable only for those listed below and in HPI above, all others are negative:  Constitutional:   Cardiovascular: Chest pain Ear/Nose/Throat:   Skin:  Eyes:   Respiratory:  Cough, wheezing, SOB Gastroitestinal:   Genitourinary:  Hematology/Lymphatic:   Endocrine:  Musculoskeletal:   Allergy/Immunology:   Neurological:   Psychiatric: depression Sleep:   The following represents the patient's updated allergies and side effects list: No Known Allergies  The neurologically relevant items on the patient's problem list were reviewed on today's visit.  Neurologic Examination  A problem focused neurological exam (12 or more points of the single system neurologic examination, vital signs counts as 1 point, cranial nerves count for 8 points) was performed.  Blood pressure (!) 159/94, pulse 79, height 5\' 4"  (1.626 m), weight 204 lb (92.5 kg).  General - Well nourished, well developed, in no apparent distress.  Ophthalmologic - Sharp disc margins OU.   Cardiovascular - Regular rate and rhythm with no murmur.  Mental Status -  Level of arousal and orientation to time, place, and person were intact. Language including expression, naming, repetition, comprehension was assessed and found intact. Fund of Knowledge was assessed and was intact.  Cranial Nerves II - XII - II - Visual field intact OU. III, IV, VI - Extraocular movements intact. V - Facial sensation intact bilaterally. VII - Facial movement intact bilaterally initially, but Hannah Ellis had one episode of left facial twitching along with eyelid twitching,  mild, lasting about 1-2 min VIII - Hearing & vestibular intact bilaterally. X - Palate elevates symmetrically. XI - Chin turning & shoulder shrug intact bilaterally. XII - Tongue protrusion intact.  Motor Strength - The patient's strength was normal in all extremities and pronator drift was  absent.  Bulk was normal and fasciculations were absent.   Motor Tone - Muscle tone was assessed at the neck and appendages and was normal.  Reflexes - The patient's reflexes were 1+ in all extremities and Hannah Ellis had no pathological reflexes.  Sensory - Light touch, temperature/pinprick, vibration and proprioception, and Romberg testing were assessed and were normal.    Coordination - The patient had normal movements in the hands and feet with no ataxia or dysmetria.  Tremor was absent.  Gait and Station - The patient's transfers, posture, gait, station, and turns were observed as normal.  Data reviewed: I personally reviewed the images and agree with the radiology interpretations.  Dg Chest 2 View 05/09/2015 Stable scarring involving the right upper lobe. No acute cardiopulmonary disease.   Ct Head Wo Contrast 05/09/2015 No acute intracranial abnormality. No definite acute cortical infarction.   Mr Maxine GlennMra Head Wo Contrast 05/09/2015 Negative intracranial MRA.   Mr Brain Wo Contrast 05/09/2015 1. No acute intracranial abnormality. 2. Moderate for age cerebral white matter and some deep gray matter signal abnormality, nonspecific, but favor small vessel disease/hypertensive changes in this setting. 3. Chronic partially empty sella configuration, often a normal anatomic variant but can be associated with idiopathic intracranial hypertension (pseudotumor cerebri).   CAROTID DOPPLER There is 1-39% bilateral ICA stenosis. Vertebral artery flow is antegrade.   TTE - - Left ventricle: The cavity size was normal. Systolic function was normal. The estimated ejection fraction was in the range of 60% to 65%. Wall motion was normal; there were no regional wall motion abnormalities. Doppler parameters are consistent with abnormal left ventricular relaxation (grade 1 diastolic dysfunction). - Mitral valve: Calcified annulus. - Left atrium: The atrium was mildly dilated. - Atrial  septum: No defect or patent foramen ovale was identified.  Component     Latest Ref Rng 05/10/2015  Cholesterol     0 - 200 mg/dL 657147  Triglycerides     <150 mg/dL 75  HDL Cholesterol     >40 mg/dL 49  Total CHOL/HDL Ratio      3.0  VLDL     0 - 40 mg/dL 15  LDL (calc)     0 - 99 mg/dL 83  Iron     28 - 846170 ug/dL 32  TIBC     962250 - 952450 ug/dL 841493 (H)  Saturation Ratios     10.4 - 31.8 % 6 (L)  UIBC      461  Retic Ct Pct     0.4 - 3.1 % 1.3  RBC.     3.87 - 5.11 MIL/uL 5.21 (H)  Retic Count, Manual     19.0 - 186.0 K/uL 67.7  Hemoglobin A1C     4.8 - 5.6 % 5.9 (H)  Mean Plasma Glucose      123  Vitamin B-12     180 - 914 pg/mL 487  Folate     >5.9 ng/mL 21.9  Ferritin     11 - 307 ng/mL 4 (L)  TSH     0.350 - 4.500 uIU/mL 4.279    Assessment: As you may recall, Hannah Ellis is a 51 y.o. African American female with PMH of HTN was admitted  on 05/09/15 for left facial numbness and slurry speech. BP was high on admission but symptoms resolved quickly. Stroke work up all negative. Her symptoms likely to be hypertensive encephalopathy vs. Anxiety. TIA less likely but not able to ruled out. On ASA and lipitor. Now on 5 BP meds for HTN management.   For left facial twitching, have to rule out neurosarcoidosis due to hx of sarcoidosis on prednisone. However, Hannah Ellis is responding to steroids well. Will check MRI brain with and without contrast. EEG to rule out seizure but low suspicious. If all negative, Hannah Ellis may have just idiopathic hemifacial spasm which may need medication or botox injection.   Plan:  - continue ASA and lipitor - continue prednisone as instructed - will check MRI brain with and without contrast to rule out neuro sarcoidosis - will do EEG to rule out seizure, but low suspicious - will do blood draw today - will decide treatment regimen after MRI and EEG - follow up with PCP - check BP at home and record - follow up in 4 months with me.  I spent more than 25  minutes of face to face time with the patient. Greater than 50% of time was spent in counseling and coordination of care. We discussed about new issue of left facial twitching, work up and treatment options.   Orders Placed This Encounter  Procedures  . MR BRAIN W WO CONTRAST    Standing Status:   Future    Standing Expiration Date:   12/27/2017    Order Specific Question:   If indicated for the ordered procedure, I authorize the administration of contrast media per Radiology protocol    Answer:   Yes    Order Specific Question:   Reason for Exam (SYMPTOM  OR DIAGNOSIS REQUIRED)    Answer:   evaluation for neurosarcoidosis and left hemifacial spasm    Order Specific Question:   Preferred imaging location?    Answer:   Internal    Order Specific Question:   What is the patient's sedation requirement?    Answer:   No Sedation    Order Specific Question:   Does the patient have a pacemaker or implanted devices?    Answer:   No  . Angiotensin converting enzyme  . CBC  . CMP  . EEG adult    Standing Status:   Future    Standing Expiration Date:   10/29/2017    Meds ordered this encounter  Medications  . DISCONTD: hydrALAZINE (APRESOLINE) 25 MG tablet    Sig: Take 25 mg by mouth 2 (two) times daily.    Refill:  1  . DISCONTD: lisinopril-hydrochlorothiazide (PRINZIDE,ZESTORETIC) 20-25 MG tablet    Sig: Take by mouth.    Patient Instructions  - continue ASA and lipitor - continue prednisone as instructed - will check MRI brain with and without contrast to rule out neuro sarcoidosis - will do EEG to rule out seizure, but low suspicious - will do blood draw today - will decide treatment regimen after MRI and EEG - follow up with PCP - check BP at home and record - follow up in 4 months with me.   Marvel Plan, MD PhD [facility] [address], [address] [place], [place] [address] [phone]

## 2016-10-31 LAB — COMPREHENSIVE METABOLIC PANEL
ALBUMIN: 4.3 g/dL (ref 3.5–5.5)
ALK PHOS: 78 IU/L (ref 39–117)
ALT: 17 IU/L (ref 0–32)
AST: 14 IU/L (ref 0–40)
Albumin/Globulin Ratio: 1.5 (ref 1.2–2.2)
BILIRUBIN TOTAL: 0.3 mg/dL (ref 0.0–1.2)
BUN / CREAT RATIO: 13 (ref 9–23)
BUN: 10 mg/dL (ref 6–24)
CHLORIDE: 100 mmol/L (ref 96–106)
CO2: 28 mmol/L (ref 18–29)
Calcium: 10.4 mg/dL — ABNORMAL HIGH (ref 8.7–10.2)
Creatinine, Ser: 0.76 mg/dL (ref 0.57–1.00)
GFR calc Af Amer: 106 mL/min/{1.73_m2} (ref >59)
GFR calc non Af Amer: 92 mL/min/{1.73_m2} (ref >59)
Globulin, Total: 2.8 g/dL (ref 1.5–4.5)
Glucose: 109 mg/dL — ABNORMAL HIGH (ref 65–99)
Potassium: 5.4 mmol/L — ABNORMAL HIGH (ref 3.5–5.2)
SODIUM: 141 mmol/L (ref 134–144)
Total Protein: 7.1 g/dL (ref 6.0–8.5)

## 2016-10-31 LAB — CBC
HEMOGLOBIN: 13.1 g/dL (ref 11.1–15.9)
Hematocrit: 41.7 % (ref 34.0–46.6)
MCH: 25.2 pg — AB (ref 26.6–33.0)
MCHC: 31.4 g/dL — AB (ref 31.5–35.7)
MCV: 80 fL (ref 79–97)
Platelets: 226 10*3/uL (ref 150–379)
RBC: 5.19 x10E6/uL (ref 3.77–5.28)
RDW: 16.1 % — AB (ref 12.3–15.4)
WBC: 12.8 10*3/uL — ABNORMAL HIGH (ref 3.4–10.8)

## 2016-10-31 LAB — ANGIOTENSIN CONVERTING ENZYME: Angio Convert Enzyme: 41 U/L (ref 14–82)

## 2016-11-01 ENCOUNTER — Telehealth: Payer: Self-pay

## 2016-11-01 NOTE — Telephone Encounter (Signed)
-----   Message from Marvel PlanJindong Xu, MD sent at 10/31/2016  4:15 PM EST ----- Could you please let the patient know that the blood test done recently in our office was unremarkable. Kidney function is normal, no problem to have MRI with contrast. Her WBC is mildly elevated which is normal due to her prednisone use. Please continue current treatment. Thanks.  Marvel PlanJindong Xu, MD PhD Stroke Neurology 10/31/2016 4:15 PM

## 2016-11-01 NOTE — Telephone Encounter (Signed)
Rn call patient about her blood test results. THe lab work was unremarkable. Kidney function was normal. Pt can have MRI with contrast. Her WBC is mildly elevated due to her prednisone use. Continue treatment plan. PT verbalized understanding.

## 2016-11-07 ENCOUNTER — Ambulatory Visit
Admission: RE | Admit: 2016-11-07 | Discharge: 2016-11-07 | Disposition: A | Discharge status: Home / Self Care | Payer: BLUE CROSS/BLUE SHIELD | Source: Ambulatory Visit | Attending: Neurology | Admitting: Neurology

## 2016-11-07 DIAGNOSIS — G513 Clonic hemifacial spasm: Secondary | ICD-10-CM | POA: Diagnosis not present

## 2016-11-07 DIAGNOSIS — G5139 Clonic hemifacial spasm, unspecified: Secondary | ICD-10-CM

## 2016-11-07 DIAGNOSIS — D869 Sarcoidosis, unspecified: Secondary | ICD-10-CM

## 2016-11-07 MED ORDER — GADOBENATE DIMEGLUMINE 529 MG/ML IV SOLN
20.0000 mL | Freq: Once | INTRAVENOUS | Status: AC | PRN
  Administered 2016-11-07: 20 mL via INTRAVENOUS

## 2016-11-12 ENCOUNTER — Telehealth: Payer: Self-pay

## 2016-11-12 ENCOUNTER — Ambulatory Visit (INDEPENDENT_AMBULATORY_CARE_PROVIDER_SITE_OTHER): Payer: BLUE CROSS/BLUE SHIELD | Admitting: Neurology

## 2016-11-12 DIAGNOSIS — D869 Sarcoidosis, unspecified: Secondary | ICD-10-CM

## 2016-11-12 DIAGNOSIS — G5139 Clonic hemifacial spasm, unspecified: Secondary | ICD-10-CM

## 2016-11-12 DIAGNOSIS — R258 Other abnormal involuntary movements: Secondary | ICD-10-CM

## 2016-11-12 NOTE — Telephone Encounter (Signed)
-----   Message from Marvel PlanJindong Xu, MD sent at 11/09/2016  1:41 AM EST ----- Could you please let the patient know that the MRI brain done recently in our office was negative for neuro sarcoidosis. Will wait for EEG to be done. Please continue current treatment. Thanks.  Marvel PlanJindong Xu, MD PhD Stroke Neurology 11/09/2016 1:41 AM

## 2016-11-12 NOTE — Telephone Encounter (Signed)
RN call patient that the MRI brain was negative for any neuro sarcoidosis. Pt verbalized understanding.

## 2016-11-13 NOTE — Procedures (Signed)
    History:  Hannah CastillaDarcella Ellis is a 51 year old patient with a history of left facial numbness and dysarthria. The patient has been diagnosed with sarcoidosis. The patient had an event of left facial twitching lasting 2 minutes. The episodes may occur off and on. The patient evaluated for possible focal seizures.  This is a routine EEG. No skull defects are noted. Medications include prednisone, Prilosec, Lopressor, lisinopril, hydrochlorothiazide, hydralazine, Lipitor, aspirin, and Norvasc.   EEG classification: Normal awake  Description of the recording: The background rhythms of this recording consists of a fairly well modulated medium amplitude alpha rhythm of 9 Hz that is reactive to eye opening and closure. As the record progresses, the patient appears to remain in the waking state throughout the recording. Photic stimulation was performed, resulting in a bilateral and symmetric photic driving response. Hyperventilation was also performed, resulting in a minimal buildup of the background rhythm activities without significant slowing seen. At no time during the recording does there appear to be evidence of spike or spike wave discharges or evidence of focal slowing. EKG monitor shows no evidence of cardiac rhythm abnormalities with a heart rate of 72.  Impression: This is a normal EEG recording in the waking state. No evidence of ictal or interictal discharges are seen.

## 2016-11-14 ENCOUNTER — Telehealth: Payer: Self-pay

## 2016-11-14 NOTE — Telephone Encounter (Signed)
Left vm for patient to call back about her EEG results. Also if her facial twitching has improve.

## 2016-11-14 NOTE — Telephone Encounter (Signed)
-----   Message from Marvel PlanJindong Xu, MD sent at 11/14/2016  3:01 PM EST ----- Could you please let the patient know that the EEG test done recently in our office was normal EEG. Please ask patient how about her facial twitching, if no improvement, we need to start some medication to try. Thanks.  Marvel PlanJindong Xu, MD PhD Stroke Neurology 11/14/2016 3:01 PM

## 2016-11-15 ENCOUNTER — Telehealth: Payer: Self-pay | Admitting: *Deleted

## 2016-11-15 ENCOUNTER — Telehealth: Payer: Self-pay | Admitting: Neurology

## 2016-11-15 NOTE — Telephone Encounter (Signed)
Patient called office returning nurse's call.  Please call °

## 2016-11-15 NOTE — Telephone Encounter (Signed)
Called patient and advised her , per Dr Roda ShuttersXu, her EEG is normal. This RN asked patient how about her facial twitching. She stated it is still twitching "but not as bad". Advised her this RN will send this note to Dr Roda ShuttersXu and his RN to inform them. She verbalized understanding, appreciation.

## 2016-11-15 NOTE — Telephone Encounter (Signed)
Called pt and discussed with her over the phone. Her MRI did not show neurosarcoidosis, and EEG no seizure. But her BA is torturious to the left which may compress on the left facial nerve causing her symptoms, which is also the most common cause of hemifacial spasm.   She stated that her facial twitching is much better than before. In the past, she has it all day long. Now just once a day, may last 2 min and then over. She does not want to start medication at this time. I told her that since the symptoms are really mild, I agree to hold off treatment at this time. If it getting worse, we can try trileptal vs. Tegretol vs. Keppra. If not effective, botox or decompressive surgery can be considered. She expressed understanding and appreciation.   Marvel PlanJindong Mckay Tegtmeyer, MD PhD Stroke Neurology 11/15/2016 2:05 PM

## 2016-11-19 NOTE — Telephone Encounter (Signed)
Please see additional phone note 

## 2016-12-05 ENCOUNTER — Ambulatory Visit (INDEPENDENT_AMBULATORY_CARE_PROVIDER_SITE_OTHER)
Admission: RE | Admit: 2016-12-05 | Discharge: 2016-12-05 | Disposition: A | Discharge status: Home / Self Care | Payer: BLUE CROSS/BLUE SHIELD | Source: Ambulatory Visit | Attending: Emergency Medicine | Admitting: Emergency Medicine

## 2016-12-05 DIAGNOSIS — D869 Sarcoidosis, unspecified: Secondary | ICD-10-CM | POA: Diagnosis not present

## 2016-12-06 ENCOUNTER — Telehealth: Payer: Self-pay | Admitting: Emergency Medicine

## 2016-12-06 NOTE — Telephone Encounter (Signed)
Spoke with pt. She is requesting the results from her CT completed yesterday.  RB - please advise. Thanks.

## 2016-12-09 ENCOUNTER — Telehealth: Payer: Self-pay | Admitting: Emergency Medicine

## 2016-12-09 NOTE — Telephone Encounter (Signed)
Spoke with pt there is already a phone message on this request. Please see phone message 12/06/16. Still awaiting for Dr. Delton CoombesByrum response.

## 2016-12-10 NOTE — Telephone Encounter (Signed)
Pt aware of results and voiced her understanding. Nothing further needed.  

## 2016-12-10 NOTE — Telephone Encounter (Signed)
Please let her know that her CT scan of the chest is stable. The nodular opacity that we have been following has largely resolved. There is a very small area of residual inflammation from her sarcoidosis. This is good news

## 2016-12-18 ENCOUNTER — Ambulatory Visit (INDEPENDENT_AMBULATORY_CARE_PROVIDER_SITE_OTHER): Payer: BLUE CROSS/BLUE SHIELD | Admitting: Emergency Medicine

## 2016-12-18 ENCOUNTER — Encounter: Payer: Self-pay | Admitting: Emergency Medicine

## 2016-12-18 DIAGNOSIS — D869 Sarcoidosis, unspecified: Secondary | ICD-10-CM | POA: Diagnosis not present

## 2016-12-18 MED ORDER — PREDNISONE 5 MG PO TABS: ORAL_TABLET | ORAL | 0 refills | Status: DC

## 2016-12-18 MED ORDER — NYSTATIN 100000 UNIT/ML MT SUSP: 5.0000 mL | Freq: Three times a day (TID) | OROMUCOSAL | 0 refills | Status: DC

## 2016-12-18 NOTE — Assessment & Plan Note (Signed)
Clinically improved, CT scan improved. Will try to taper pred to off. Follow for sx. Discussed the sx of adrenal insufficiency w her. Follow in 2 omnths.

## 2016-12-18 NOTE — Patient Instructions (Signed)
We will work on decreasing your prednisone with a goal to getting it off altogether.  Please change to 7.5mg  daily for 2 weeks, then 5mg  daily for 2 weeks, then stop. Call our office for any weakness, fatigue, new symptoms when you are off of the prednisone.  Please take Nystatin mouthwash three times a day for 5 days.  Follow with Dr Delton CoombesByrum in 2 months or sooner if you have any problems.

## 2016-12-18 NOTE — Progress Notes (Signed)
Subjective:    Patient ID: Hannah Ellis, female    DOB: 10-31-1965, 51 y.o.   MRN: [id number]  HPI 51 year old woman with sarcoidosis that was diagnosed by bronchoscopy in 06/2016. A review of [facility] indicate that she had diffuse bilateral infiltrates on Ct chest in September prompting the evaluation. She was treated with corticosteroids and she remains on prednisone 10 mg daily. A repeat CT chest was done 09/09/16 that showed large scale resolution of her infiltrates but a residual spiculated nodule in the Rt mid lung. I personally reviewed the films.   Spirometry from 06/2016 >> normal airflows, no BD response.   She has noticed some L facial tingling and tremor that are new over the last 2 weeks. Never a rash. Her breathing has improved since she was treated with steroids.   ROV 12/18/16 -- Follow-up visit for patient with sarcoidosis. Had an apparent acute exacerbation to prolong steroids. A repeat CT scan of the chest 09/09/16 showed some residual nodular change. We repeated a CT scan on 12/05/16 and I have personally reviewed. She feels stable, that her breathing is doing ok.   No wheeze or cough. She has not had a rash.    Review of Systems  Constitutional: Negative.  Negative for fever and unexpected weight change.  HENT: Negative.  Negative for congestion, dental problem, ear pain, nosebleeds, postnasal drip, rhinorrhea, sinus pressure, sneezing, sore throat and trouble swallowing.   Eyes: Negative.  Negative for redness and itching.  Respiratory: Positive for cough, shortness of breath and wheezing. Negative for chest tightness.   Cardiovascular: Negative.  Negative for palpitations and leg swelling.  Gastrointestinal: Negative.  Negative for nausea and vomiting.  Genitourinary: Negative.  Negative for dysuria.  Musculoskeletal: Negative.  Negative for joint swelling.  Skin: Negative for rash.  Neurological: Negative.  Negative for headaches.    Hematological: Negative.  Does not bruise/bleed easily.  Psychiatric/Behavioral: Negative.  Negative for dysphoric mood. The patient is not nervous/anxious.     Past Medical History:  Diagnosis Date  . Depression   . GERD (gastroesophageal reflux disease)   . Hypertension   . Sarcoidosis (HCC)   . Stroke (HCC)   . TIA (transient ischemic attack) 05/09/2015  . 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 Hypertension Mother   . Colon cancer Mother   . Hypertension Father   . Hypertension Brother      Social History   Social History  . Marital status: Married    Spouse name: N/A  . Number of children: N/A  . Years of education: N/A   Occupational History  . Not on file.   Social History Main Topics  . Smoking status: Never Smoker  . Smokeless tobacco: Never Used  . Alcohol use No  . Drug use: No  . Sexual activity: Yes   Other Topics Concern  . Not on file   Social History Narrative  . No narrative on file  [place] in [place], originally from [place].  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   No Known Allergies   Outpatient Medications Prior to Visit  Medication Sig Dispense Refill  . amLODipine (NORVASC) 10 MG tablet Take 1 tablet (10 mg total) by mouth daily. 30 tablet 0  . aspirin EC 325 MG EC tablet Take 1 tablet (325 mg total) by mouth daily. 30 tablet  0  . atorvastatin (LIPITOR) 10 MG tablet Take 1 tablet (10 mg total) by mouth daily at 6 PM. 30 tablet 0  . Calcium 150 MG TABS Take by mouth daily.    . hydrALAZINE (APRESOLINE) 10 MG tablet Take 1 tablet (10 mg total) by mouth every 8 (eight) hours. 90 tablet 0  . hydrochlorothiazide (HYDRODIURIL) 25 MG tablet Take 1 tablet (25 mg total) by mouth daily. 30 tablet 0  . levofloxacin (LEVAQUIN) 750 MG tablet Take 1 tablet (750 mg total) by mouth once. 4 tablet 0  . lisinopril (PRINIVIL,ZESTRIL) 40 MG tablet Take 1 tablet (40 mg total)  by mouth daily. 30 tablet 0  . metoprolol tartrate (LOPRESSOR) 25 MG tablet Take 25 mg by mouth 2 (two) times daily.    Marland Kitchen omeprazole (PRILOSEC) 40 MG capsule   0  . Pediatric Multiple Vit-C-FA (FLINSTONES GUMMIES OMEGA-3 DHA) CHEW Chew by mouth.    . predniSONE (DELTASONE) 10 MG tablet Take 10 mg by mouth daily with breakfast.     No facility-administered medications prior to visit.         Objective:   Physical Exam Vitals:   12/18/16 1635  BP: (!) 158/98  Pulse: 96  SpO2: 99%  Weight: 203 lb (92.1 kg)  Height: 5\' 4"  (1.626 m)   Gen: Pleasant, well-nourished, in no distress,  normal affect  ENT: No lesions,  mouth clear,  oropharynx clear, no postnasal drip  Neck: No JVD, no TMG, no carotid bruits  Lungs: No use of accessory muscles, clear without rales or rhonchi  Cardiovascular: RRR, heart sounds normal, no murmur or gallops, no peripheral edema  Musculoskeletal: No deformities, no cyanosis or clubbing  Neuro: alert, non focal  Skin: Warm, no lesions or rashes     Assessment & Plan:  Sarcoidosis (HCC) Clinically improved, CT scan improved. Will try to taper pred to off. Follow for sx. Discussed the sx of adrenal insufficiency w her. Follow in 2 omnths.   Levy Pupa, MD, PhD 12/18/2016, 5:52 PM [facility] [phone] or if no answer [phone]

## 2016-12-30 ENCOUNTER — Telehealth: Payer: Self-pay | Admitting: Emergency Medicine

## 2016-12-30 NOTE — Telephone Encounter (Signed)
Pt states that she is feeling tired and nausea since tapering down on the Prednisone >> current dose is 7.5mg  (Pred  tabs) and she will be tapering down every 2 weeks until completely off. Pt is wanting to make sure that the way she is feeling is normal. Will send this message to Dr Delton Coombes, please advise. Thanks.

## 2016-12-31 MED ORDER — PREDNISONE 1 MG PO TABS
ORAL_TABLET | ORAL | 0 refills | Status: DC
Start: 2016-12-31 — End: 2017-02-17

## 2016-12-31 NOTE — Telephone Encounter (Signed)
Spoke with pt and informed her of RB's message. Pt understood and agreed to the change in the tapering of her medication. Her new rx was sent to her pharmacy of choice. Pt agreed to the follow up appointment. This had been made. Nothing further is needed.

## 2016-12-31 NOTE — Telephone Encounter (Signed)
We may be tapering to quickly. Please have her go to  daily for the next week.  Then give her a new script to allow her to go to  daily for 2 weeks, then to  qd for 2 weeks. I would like to see her in the next month to plan further slow taper.

## 2017-02-04 ENCOUNTER — Ambulatory Visit: Payer: BLUE CROSS/BLUE SHIELD | Admitting: Emergency Medicine

## 2017-02-04 ENCOUNTER — Ambulatory Visit (INDEPENDENT_AMBULATORY_CARE_PROVIDER_SITE_OTHER): Payer: BLUE CROSS/BLUE SHIELD | Admitting: Emergency Medicine

## 2017-02-04 ENCOUNTER — Encounter: Payer: Self-pay | Admitting: Emergency Medicine

## 2017-02-04 VITALS — BP 140/92 | HR 88 | Ht 64.0 in | Wt 199.8 lb

## 2017-02-04 DIAGNOSIS — R21 Rash and other nonspecific skin eruption: Secondary | ICD-10-CM

## 2017-02-04 DIAGNOSIS — D869 Sarcoidosis, unspecified: Secondary | ICD-10-CM

## 2017-02-04 NOTE — Assessment & Plan Note (Signed)
She is tolerated discontinuation of prednisone from a respiratory standpoint. She does not describe any pulmonary limitation currently. Unfortunately she has developed migratory rash, question whether this could be related to sarcoidosis versus other. Because the diagnoses of this rash will significantly impact whether she needs to be on chronic immunosuppressive therapy I believe she needs to be seen by dermatology, have a workup or biopsy. I will refer her to Derm. Will follow her clinically for now. Likely needs repeat Ct chest in coming months. Will plan this based on her clinical status.

## 2017-02-04 NOTE — Progress Notes (Signed)
Subjective:    Patient ID: Hannah Ellis, female    DOB: 09/17/1966, 51 y.o.   MRN: [id number]  HPI 51 year old woman with sarcoidosis that was diagnosed by bronchoscopy in 06/2016. A review of her [facility] indicate that she had diffuse bilateral infiltrates on Ct chest in September prompting the evaluation. She was treated with corticosteroids and she remains on prednisone 10 mg daily. A repeat CT chest was done 09/09/16 that showed large scale resolution of her infiltrates but a residual spiculated nodule in the Rt mid lung. I personally reviewed the films.   Spirometry from 06/2016 >> normal airflows, no BD response.   She has noticed some L facial tingling and tremor that are new over the last 2 weeks. Never a rash. Her breathing has improved since she was treated with steroids.   ROV 12/18/16 -- Follow-up visit for patient with sarcoidosis. Had an apparent acute exacerbation to prolong steroids. A repeat CT scan of the chest 09/09/16 showed some residual nodular change. We repeated a CT scan on 12/05/16 and I have personally reviewed. She feels stable, that her breathing is doing ok.   No wheeze or cough. She has not had a rash.   ROV 02/04/17 -- This follow-up visit for patient with pulmonary sarcoidosis, also with a history of nodular rash. Based on improvement in her infiltrates noted on CT scan from 12/05/16 we tapered prednisone. She has tapered completely off. When she tapered of she started to see a red bumpy rash. It has been migratory, has affected her arms, neck, thighs. It itches, then clears on its own. Then it affects another area. No new soaps or exposures. She believes that her breathing is doing well, able to exert. No wheeze or cough.    Review of Systems  Constitutional: Negative.  Negative for fever and unexpected weight change.  HENT: Negative.  Negative for congestion, dental problem, ear pain, nosebleeds, postnasal drip, rhinorrhea, sinus pressure,  sneezing, sore throat and trouble swallowing.   Eyes: Negative.  Negative for redness and itching.  Respiratory: Negative for cough, chest tightness, shortness of breath and wheezing.   Cardiovascular: Negative.  Negative for palpitations and leg swelling.  Gastrointestinal: Negative.  Negative for nausea and vomiting.  Genitourinary: Negative.  Negative for dysuria.  Musculoskeletal: Negative.  Negative for joint swelling.  Skin: Positive for rash.  Neurological: Negative.  Negative for headaches.  Hematological: Negative.  Does not bruise/bleed easily.  Psychiatric/Behavioral: Negative.  Negative for dysphoric mood. The patient is not nervous/anxious.     Past Medical History:  Diagnosis Date  . Depression   . GERD (gastroesophageal reflux disease)   . Hypertension   . Sarcoidosis   . Stroke (HCC)   . TIA (transient ischemic attack) 05/09/2015  . 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 Hypertension Mother   . Colon cancer Mother   . Hypertension Father   . Hypertension Brother      Social History   Social History  . Marital status: Married    Spouse name: N/A  . Number of children: N/A  . Years of education: N/A   Occupational History  . Not on file.   Social History Main Topics  . Smoking status: Never Smoker  . Smokeless tobacco: Never Used  . Alcohol use No  . Drug use: No  . Sexual activity: Yes   Other  Topics Concern  . Not on file   Social History Narrative  . No narrative on file  [place] in [place], originally from [place].  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   No Known Allergies   Outpatient Medications Prior to Visit  Medication Sig Dispense Refill  . amLODipine (NORVASC) 10 MG tablet Take 1 tablet (10 mg total) by mouth daily. 30 tablet 0  . aspirin EC 325 MG EC tablet Take 1 tablet (325 mg total) by mouth daily. 30 tablet 0  . atorvastatin (LIPITOR) 10 MG  tablet Take 1 tablet (10 mg total) by mouth daily at 6 PM. 30 tablet 0  . Calcium 150 MG TABS Take by mouth daily.    . hydrALAZINE (APRESOLINE) 10 MG tablet Take 1 tablet (10 mg total) by mouth every 8 (eight) hours. 90 tablet 0  . hydrochlorothiazide (HYDRODIURIL) 25 MG tablet Take 1 tablet (25 mg total) by mouth daily. 30 tablet 0  . lisinopril (PRINIVIL,ZESTRIL) 40 MG tablet Take 1 tablet (40 mg total) by mouth daily. 30 tablet 0  . metoprolol tartrate (LOPRESSOR) 25 MG tablet Take 25 mg by mouth 2 (two) times daily.    Marland Kitchen. nystatin (MYCOSTATIN) 100000 UNIT/ML suspension Take 5 mLs (500,000 Units total) by mouth 3 (three) times daily. 60 mL 0  . omeprazole (PRILOSEC) 40 MG capsule   0  . Pediatric Multiple Vit-C-FA (FLINSTONES GUMMIES OMEGA-3 DHA) CHEW Chew by mouth.    . levofloxacin (LEVAQUIN) 750 MG tablet Take 1 tablet (750 mg total) by mouth once. 4 tablet 0  . predniSONE (DELTASONE) 1 MG tablet Take 9mg  daily for 2 weeks,then 8mg  for 2 weeks (Patient not taking: Reported on 02/04/2017) 98 tablet 0  . predniSONE (DELTASONE) 10 MG tablet Take 10 mg by mouth daily with breakfast.    . predniSONE (DELTASONE) 5 MG tablet Take 1 and a half tablet for 2 weeks, then 5 mg daily for 2 weeks (Patient not taking: Reported on 02/04/2017) 35 tablet 0   No facility-administered medications prior to visit.         Objective:   Physical Exam Vitals:   02/04/17 1122  BP: (!) 140/92  Pulse: 88  SpO2: 96%  Weight: 199 lb 12.8 oz (90.6 kg)  Height: 5\' 4"  (1.626 m)   Gen: Pleasant, well-nourished, in no distress,  normal affect  ENT: No lesions,  mouth clear,  oropharynx clear, no postnasal drip  Neck: No JVD, no TMG, no carotid bruits  Lungs: No use of accessory muscles, clear without rales or rhonchi  Cardiovascular: RRR, heart sounds normal, no murmur or gallops, no peripheral edema  Musculoskeletal: No deformities, no cyanosis or clubbing  Neuro: alert, non focal  Skin: Very pale  raised, appears to itch, on her upper arms and posterior neck     Assessment & Plan:  Sarcoidosis (HCC) She is tolerated discontinuation of prednisone from a respiratory standpoint. She does not describe any pulmonary limitation currently. Unfortunately she has developed migratory rash, question whether this could be related to sarcoidosis versus other. Because the diagnoses of this rash will significantly impact whether she needs to be on chronic immunosuppressive therapy I believe she needs to be seen by dermatology, have a workup or biopsy. I will refer her to Derm. Will follow her clinically for now. Likely needs repeat Ct chest in coming months. Will plan this based on her clinical status.   Levy Pupaobert Chaniah Cisse, MD, PhD 02/04/2017, 11:50 AM [facility] [phone] or if no answer [phone]

## 2017-02-04 NOTE — Addendum Note (Signed)
Addended by: Velvet BatheAULFIELD, ASHLEY L on: 02/04/2017 11:54 AM   Modules accepted: Orders

## 2017-02-04 NOTE — Patient Instructions (Addendum)
We will not restart prednisone at this time We will make a referral to dermatology to evaluate your rash. In particular we need to sort out whether this could be related to sarcoidosis Follow with Dr Delton CoombesByrum in 3 months or sooner if you have any problems.

## 2017-02-10 ENCOUNTER — Telehealth: Payer: Self-pay | Admitting: Emergency Medicine

## 2017-02-10 MED ORDER — BENZONATATE 100 MG PO CAPS: 100.0000 mg | ORAL_CAPSULE | Freq: Three times a day (TID) | ORAL | 0 refills | Status: DC | PRN

## 2017-02-10 NOTE — Telephone Encounter (Signed)
Spoke with pt about PM's message. Pt understood and agreed to the medication being sent in. Her rx was sent to her pharmacy of choice. She had no further questions. Nothing further is needed.

## 2017-02-10 NOTE — Telephone Encounter (Signed)
Prescribe tessalon 100 mg tid PRN Use OTC mucinex DM

## 2017-02-10 NOTE — Telephone Encounter (Signed)
PM  Please Advise in RB'a absence-  Pt was told the last time she saw RB to stop her prednisone which she has done. Since she has stopped she has had a non productive cough. She states her breathing is doing ok,denies wheezing,fever. She would rather not be back on the prednisone, she is requesting something else to be prescribed.   Instructions   We will not restart prednisone at this time We will make a referral to dermatology to evaluate your rash. In particular we need to sort out whether this could be related to sarcoidosis Follow with Dr Delton CoombesByrum in 3 months or sooner if you have any problems.

## 2017-02-13 ENCOUNTER — Telehealth: Payer: Self-pay | Admitting: Emergency Medicine

## 2017-02-13 MED ORDER — DOXYCYCLINE HYCLATE 100 MG PO TABS: 100.0000 mg | ORAL_TABLET | Freq: Two times a day (BID) | ORAL | 0 refills | Status: DC

## 2017-02-13 NOTE — Telephone Encounter (Signed)
Spoke with pt, who reports of non prod cough x3-4d & temp of 101.6 today. Pt denies any other resp symptoms. Pt taken tessalon tid with no improvement.   CY please advise, as RB is on vacation. Thanks.   Current Outpatient Prescriptions on File Prior to Visit  Medication Sig Dispense Refill  . amLODipine (NORVASC) 10 MG tablet Take 1 tablet (10 mg total) by mouth daily. 30 tablet 0  . aspirin EC 325 MG EC tablet Take 1 tablet (325 mg total) by mouth daily. 30 tablet 0  . atorvastatin (LIPITOR) 10 MG tablet Take 1 tablet (10 mg total) by mouth daily at 6 PM. 30 tablet 0  . benzonatate (TESSALON) 100 MG capsule Take 1 capsule (100 mg total) by mouth 3 (three) times daily as needed for cough. 90 capsule 0  . Calcium 150 MG TABS Take by mouth daily.    . hydrALAZINE (APRESOLINE) 10 MG tablet Take 1 tablet (10 mg total) by mouth every 8 (eight) hours. 90 tablet 0  . hydrochlorothiazide (HYDRODIURIL) 25 MG tablet Take 1 tablet (25 mg total) by mouth daily. 30 tablet 0  . lisinopril (PRINIVIL,ZESTRIL) 40 MG tablet Take 1 tablet (40 mg total) by mouth daily. 30 tablet 0  . metoprolol tartrate (LOPRESSOR) 25 MG tablet Take 25 mg by mouth 2 (two) times daily.    Marland Kitchen. nystatin (MYCOSTATIN) 100000 UNIT/ML suspension Take 5 mLs (500,000 Units total) by mouth 3 (three) times daily. 60 mL 0  . omeprazole (PRILOSEC) 40 MG capsule   0  . Pediatric Multiple Vit-C-FA (FLINSTONES GUMMIES OMEGA-3 DHA) CHEW Chew by mouth.    . predniSONE (DELTASONE) 1 MG tablet Take 9mg  daily for 2 weeks,then 8mg  for 2 weeks (Patient not taking: Reported on 02/04/2017) 98 tablet 0  . predniSONE (DELTASONE) 10 MG tablet Take 10 mg by mouth daily with breakfast.    . predniSONE (DELTASONE) 5 MG tablet Take 1 and a half tablet for 2 weeks, then 5 mg daily for 2 weeks (Patient not taking: Reported on 02/04/2017) 35 tablet 0   No current facility-administered medications on file prior to visit.     No Known Allergies

## 2017-02-13 NOTE — Telephone Encounter (Signed)
Spoke with pt, aware of recs.  rx sent to preferred pharmacy.  Nothing further needed.  

## 2017-02-13 NOTE — Telephone Encounter (Signed)
Offer doxycycline 100 mg, # 14     1 twice daily  Mucinex-DM may help

## 2017-02-17 ENCOUNTER — Ambulatory Visit (HOSPITAL_COMMUNITY)
Admission: EM | Admit: 2017-02-17 | Discharge: 2017-02-17 | Disposition: A | Discharge status: Home / Self Care | Payer: BLUE CROSS/BLUE SHIELD | Source: Home / Self Care

## 2017-02-17 ENCOUNTER — Emergency Department (HOSPITAL_COMMUNITY): Payer: BLUE CROSS/BLUE SHIELD

## 2017-02-17 ENCOUNTER — Inpatient Hospital Stay (HOSPITAL_COMMUNITY)
Admission: EM | Admit: 2017-02-17 | Discharge: 2017-02-19 | DRG: 392 | Disposition: A | Discharge status: Home / Self Care | Payer: BLUE CROSS/BLUE SHIELD | Attending: Family Medicine | Admitting: Family Medicine

## 2017-02-17 ENCOUNTER — Encounter (HOSPITAL_COMMUNITY): Payer: Self-pay | Admitting: *Deleted

## 2017-02-17 ENCOUNTER — Encounter (HOSPITAL_COMMUNITY): Payer: Self-pay | Admitting: Emergency Medicine

## 2017-02-17 DIAGNOSIS — Z91128 Patient's intentional underdosing of medication regimen for other reason: Secondary | ICD-10-CM

## 2017-02-17 DIAGNOSIS — Z9049 Acquired absence of other specified parts of digestive tract: Secondary | ICD-10-CM | POA: Diagnosis not present

## 2017-02-17 DIAGNOSIS — E876 Hypokalemia: Secondary | ICD-10-CM | POA: Diagnosis present

## 2017-02-17 DIAGNOSIS — K219 Gastro-esophageal reflux disease without esophagitis: Secondary | ICD-10-CM | POA: Diagnosis present

## 2017-02-17 DIAGNOSIS — N2889 Other specified disorders of kidney and ureter: Secondary | ICD-10-CM | POA: Diagnosis present

## 2017-02-17 DIAGNOSIS — I959 Hypotension, unspecified: Secondary | ICD-10-CM | POA: Diagnosis present

## 2017-02-17 DIAGNOSIS — Z79899 Other long term (current) drug therapy: Secondary | ICD-10-CM

## 2017-02-17 DIAGNOSIS — R1031 Right lower quadrant pain: Secondary | ICD-10-CM | POA: Diagnosis present

## 2017-02-17 DIAGNOSIS — F329 Major depressive disorder, single episode, unspecified: Secondary | ICD-10-CM | POA: Diagnosis present

## 2017-02-17 DIAGNOSIS — Z8249 Family history of ischemic heart disease and other diseases of the circulatory system: Secondary | ICD-10-CM | POA: Diagnosis not present

## 2017-02-17 DIAGNOSIS — I10 Essential (primary) hypertension: Secondary | ICD-10-CM | POA: Diagnosis present

## 2017-02-17 DIAGNOSIS — N12 Tubulo-interstitial nephritis, not specified as acute or chronic: Secondary | ICD-10-CM

## 2017-02-17 DIAGNOSIS — D869 Sarcoidosis, unspecified: Secondary | ICD-10-CM | POA: Diagnosis present

## 2017-02-17 DIAGNOSIS — F411 Generalized anxiety disorder: Secondary | ICD-10-CM | POA: Diagnosis present

## 2017-02-17 DIAGNOSIS — Z8673 Personal history of transient ischemic attack (TIA), and cerebral infarction without residual deficits: Secondary | ICD-10-CM | POA: Diagnosis not present

## 2017-02-17 DIAGNOSIS — H5462 Unqualified visual loss, left eye, normal vision right eye: Secondary | ICD-10-CM | POA: Diagnosis present

## 2017-02-17 DIAGNOSIS — R05 Cough: Secondary | ICD-10-CM | POA: Diagnosis present

## 2017-02-17 DIAGNOSIS — Z7982 Long term (current) use of aspirin: Secondary | ICD-10-CM | POA: Diagnosis not present

## 2017-02-17 DIAGNOSIS — R109 Unspecified abdominal pain: Secondary | ICD-10-CM | POA: Diagnosis present

## 2017-02-17 DIAGNOSIS — T50996A Underdosing of other drugs, medicaments and biological substances, initial encounter: Secondary | ICD-10-CM | POA: Diagnosis present

## 2017-02-17 DIAGNOSIS — R112 Nausea with vomiting, unspecified: Secondary | ICD-10-CM | POA: Diagnosis present

## 2017-02-17 HISTORY — DX: Unspecified osteoarthritis, unspecified site: M19.90

## 2017-02-17 HISTORY — DX: Blindness, one eye, unspecified eye: H54.40

## 2017-02-17 HISTORY — DX: Sarcoidosis of lung: D86.0

## 2017-02-17 HISTORY — DX: Pneumonia, unspecified organism: J18.9

## 2017-02-17 LAB — CBC
HCT: 34.9 % — ABNORMAL LOW (ref 36.0–46.0)
HEMOGLOBIN: 10.7 g/dL — AB (ref 12.0–15.0)
MCH: 23 pg — AB (ref 26.0–34.0)
MCHC: 30.7 g/dL (ref 30.0–36.0)
MCV: 74.9 fL — AB (ref 78.0–100.0)
Platelets: 156 10*3/uL (ref 150–400)
RBC: 4.66 MIL/uL (ref 3.87–5.11)
RDW: 17 % — ABNORMAL HIGH (ref 11.5–15.5)
WBC: 8.3 10*3/uL (ref 4.0–10.5)

## 2017-02-17 LAB — BASIC METABOLIC PANEL
Anion gap: 11 (ref 5–15)
BUN: 14 mg/dL (ref 6–20)
CO2: 28 mmol/L (ref 22–32)
Calcium: 8.6 mg/dL — ABNORMAL LOW (ref 8.9–10.3)
Chloride: 97 mmol/L — ABNORMAL LOW (ref 101–111)
Creatinine, Ser: 0.93 mg/dL (ref 0.44–1.00)
GFR calc Af Amer: >60 mL/min (ref >60)
GLUCOSE: 105 mg/dL — AB (ref 65–99)
POTASSIUM: 2.8 mmol/L — AB (ref 3.5–5.1)
Sodium: 136 mmol/L (ref 135–145)

## 2017-02-17 LAB — WET PREP, GENITAL
Clue Cells Wet Prep HPF POC: NONE SEEN
Sperm: NONE SEEN
Trich, Wet Prep: NONE SEEN
Yeast Wet Prep HPF POC: NONE SEEN

## 2017-02-17 LAB — I-STAT BETA HCG BLOOD, ED (MC, WL, AP ONLY): I-stat hCG, quantitative: <5 m[IU]/mL (ref <5)

## 2017-02-17 LAB — COMPREHENSIVE METABOLIC PANEL
ALBUMIN: 3.2 g/dL — AB (ref 3.5–5.0)
ALT: 70 U/L — AB (ref 14–54)
AST: 52 U/L — ABNORMAL HIGH (ref 15–41)
Alkaline Phosphatase: 64 U/L (ref 38–126)
Anion gap: 11 (ref 5–15)
BUN: 15 mg/dL (ref 6–20)
CHLORIDE: 99 mmol/L — AB (ref 101–111)
CO2: 28 mmol/L (ref 22–32)
CREATININE: 0.99 mg/dL (ref 0.44–1.00)
Calcium: 8.8 mg/dL — ABNORMAL LOW (ref 8.9–10.3)
GFR calc non Af Amer: >60 mL/min (ref >60)
Glucose, Bld: 105 mg/dL — ABNORMAL HIGH (ref 65–99)
Potassium: 2.8 mmol/L — ABNORMAL LOW (ref 3.5–5.1)
SODIUM: 138 mmol/L (ref 135–145)
Total Bilirubin: 0.6 mg/dL (ref 0.3–1.2)
Total Protein: 7 g/dL (ref 6.5–8.1)

## 2017-02-17 LAB — URINALYSIS, ROUTINE W REFLEX MICROSCOPIC
Bilirubin Urine: NEGATIVE
Glucose, UA: NEGATIVE mg/dL
Ketones, ur: 5 mg/dL — AB
Nitrite: NEGATIVE
PROTEIN: 100 mg/dL — AB
Specific Gravity, Urine: 1.019 (ref 1.005–1.030)
pH: 6 (ref 5.0–8.0)

## 2017-02-17 LAB — LACTIC ACID, PLASMA: LACTIC ACID, VENOUS: 0.8 mmol/L (ref 0.5–1.9)

## 2017-02-17 LAB — LIPASE, BLOOD: Lipase: 35 U/L (ref 11–51)

## 2017-02-17 MED ORDER — IOPAMIDOL (ISOVUE-300) INJECTION 61%
INTRAVENOUS | Status: AC
  Administered 2017-02-17: 100 mL
  Filled 2017-02-17: qty 100

## 2017-02-17 MED ORDER — POTASSIUM CHLORIDE CRYS ER 20 MEQ PO TBCR
40.0000 meq | EXTENDED_RELEASE_TABLET | Freq: Once | ORAL | Status: AC
  Administered 2017-02-17: 40 meq via ORAL
  Filled 2017-02-17: qty 2

## 2017-02-17 MED ORDER — ONDANSETRON 4 MG PO TBDP
4.0000 mg | ORAL_TABLET | Freq: Once | ORAL | Status: AC
  Administered 2017-02-17: 4 mg via ORAL

## 2017-02-17 MED ORDER — SODIUM CHLORIDE 0.9 % IV SOLN
INTRAVENOUS | Status: DC
  Administered 2017-02-17: 1 mL via INTRAVENOUS
  Administered 2017-02-18: 18:00:00 via INTRAVENOUS
  Administered 2017-02-18: 1 mL via INTRAVENOUS

## 2017-02-17 MED ORDER — ACETAMINOPHEN 325 MG PO TABS: 650.0000 mg | ORAL_TABLET | Freq: Four times a day (QID) | ORAL | Status: DC | PRN

## 2017-02-17 MED ORDER — ONDANSETRON HCL 4 MG PO TABS: 4.0000 mg | ORAL_TABLET | Freq: Four times a day (QID) | ORAL | Status: DC | PRN

## 2017-02-17 MED ORDER — SODIUM CHLORIDE 0.9 % IV SOLN
Freq: Once | INTRAVENOUS | Status: AC
  Administered 2017-02-17: 16:00:00 via INTRAVENOUS

## 2017-02-17 MED ORDER — POTASSIUM CHLORIDE CRYS ER 20 MEQ PO TBCR
40.0000 meq | EXTENDED_RELEASE_TABLET | Freq: Three times a day (TID) | ORAL | Status: AC
  Administered 2017-02-17 – 2017-02-18 (×3): 40 meq via ORAL
  Filled 2017-02-17 (×3): qty 2

## 2017-02-17 MED ORDER — DEXTROSE 5 % IV SOLN
1.0000 g | INTRAVENOUS | Status: DC
  Administered 2017-02-18: 1 g via INTRAVENOUS
  Filled 2017-02-17 (×2): qty 10

## 2017-02-17 MED ORDER — DOCUSATE SODIUM 100 MG PO CAPS
100.0000 mg | ORAL_CAPSULE | Freq: Two times a day (BID) | ORAL | Status: DC
  Administered 2017-02-17 – 2017-02-19 (×2): 100 mg via ORAL
  Filled 2017-02-17 (×4): qty 1

## 2017-02-17 MED ORDER — BENZONATATE 100 MG PO CAPS: 100.0000 mg | ORAL_CAPSULE | Freq: Three times a day (TID) | ORAL | Status: DC | PRN

## 2017-02-17 MED ORDER — MORPHINE SULFATE (PF) 4 MG/ML IV SOLN
4.0000 mg | Freq: Once | INTRAVENOUS | Status: AC
  Administered 2017-02-17: 4 mg via INTRAVENOUS
  Filled 2017-02-17: qty 1

## 2017-02-17 MED ORDER — ATORVASTATIN CALCIUM 10 MG PO TABS
10.0000 mg | ORAL_TABLET | Freq: Every day | ORAL | Status: DC
  Administered 2017-02-18: 10 mg via ORAL
  Filled 2017-02-17: qty 1

## 2017-02-17 MED ORDER — ONDANSETRON 4 MG PO TBDP
ORAL_TABLET | ORAL | Status: AC
  Filled 2017-02-17: qty 1

## 2017-02-17 MED ORDER — PANTOPRAZOLE SODIUM 40 MG PO TBEC
40.0000 mg | DELAYED_RELEASE_TABLET | Freq: Every day | ORAL | Status: DC
  Administered 2017-02-17 – 2017-02-19 (×3): 40 mg via ORAL
  Filled 2017-02-17 (×3): qty 1

## 2017-02-17 MED ORDER — ASPIRIN EC 325 MG PO TBEC
325.0000 mg | DELAYED_RELEASE_TABLET | Freq: Every day | ORAL | Status: DC
  Administered 2017-02-18 – 2017-02-19 (×2): 325 mg via ORAL
  Filled 2017-02-17 (×2): qty 1

## 2017-02-17 MED ORDER — ACETAMINOPHEN 650 MG RE SUPP: 650.0000 mg | Freq: Four times a day (QID) | RECTAL | Status: DC | PRN

## 2017-02-17 MED ORDER — DEXTROSE 5 % IV SOLN
1.0000 g | Freq: Once | INTRAVENOUS | Status: AC
  Administered 2017-02-17: 1 g via INTRAVENOUS
  Filled 2017-02-17: qty 10

## 2017-02-17 MED ORDER — HEPARIN SODIUM (PORCINE) 5000 UNIT/ML IJ SOLN
5000.0000 [IU] | Freq: Three times a day (TID) | INTRAMUSCULAR | Status: DC
  Administered 2017-02-17 – 2017-02-18 (×2): 5000 [IU] via SUBCUTANEOUS
  Filled 2017-02-17 (×3): qty 1

## 2017-02-17 MED ORDER — ONDANSETRON HCL 4 MG/2ML IJ SOLN: 4.0000 mg | Freq: Four times a day (QID) | INTRAMUSCULAR | Status: DC | PRN

## 2017-02-17 NOTE — Progress Notes (Signed)
Pharmacy Antibiotic Note  Doyce Laural BenesJohnson is a 51 y.o. female admitted on 02/17/2017 with possible pyelonephritis.  Pharmacy has been consulted for ceftriaxone dosing. Patient is afebrile, WBC wnl  Plan: Ceftriaxone 1g IV daily F/u cultures, length of therapy  Height: 5\' 4"  (162.6 cm) Weight: 180 lb (81.6 kg) IBW/kg (Calculated) : 54.7  Temp (24hrs), Avg:98.3 F (36.8 C), Min:98.3 F (36.8 C), Max:98.3 F (36.8 C)   Recent Labs Lab 02/17/17 1213  WBC 8.3  CREATININE 0.99    Estimated Creatinine Clearance: 70.3 mL/min (by C-G formula based on SCr of 0.99 mg/dL).    No Known Allergies  Antimicrobials this admission: 5/28 CTX>>  Microbiology results: 5/28 UCx: sent  Thank you for allowing pharmacy to be a part of this patient's care.  Tenna ChildRenee J Audrielle Vankuren 02/17/2017 6:34 PM

## 2017-02-17 NOTE — ED Notes (Signed)
Attempted to call report

## 2017-02-17 NOTE — H&P (Signed)
Family Medicine Teaching [facility] Admission History and Physical Service Pager: [phone]  Patient name: Hannah Ellis Medical record number: [id number] Date of birth: 12/19/1965 Age: 51 y.o. Gender: female  Primary Care Provider: Maudie Flakes, FNP Consultants: None  Code Status: FULL   Chief Complaint: R-sided abdominal pain  Assessment and Plan: Hannah Ellis is a 51 y.o. female presenting with R-sided abdominal pain x 3 days.  PMH is significant for acid reflux, HTN, sarcoidosis, and CVA.    RLQ abdominal pain/Nausea/Vomiting.  Endorsing right-sided abdominal pain x 3 days duration.  Also with nausea, vomiting and poor po since Wednesday.  The nausea, vomiting and a reported fever at home to 101 preceded the abdominal pain.  Per patient, these symptoms have since subsided and the abdominal pain has persisted. On exam, patient is exquisitely tender to palpation over RLQ with no rebound tenderness or guarding present. It almost seems as though it is more over the R ovary? Additionally, no CVA tenderness.  With history of prior abdominal surgery - appendectomy, C-section and tubal ligation in the past.  Hasn't taken her medications since Wednesday due to nausea and vomiting.  On arrival patient afebrile with VSS. No white count. Labs notable for K+ 2.8, mildly elevated AST of 52 and ALT of 70. Pregnancy test negative and pelvic exam in ED was unremarkable.   Wet prep with moderate WBC but otherwise negative.   Syphilis, HPV and HIV recently checked in PCP office on May 23rd and were negative.  UA hazy with large hgb, 100 protein, rare bacteria trace leuks. Urine sent for culture and she was started CTX in the ED.  Given IVF's, Zofran, and morphine for pain control.   Normal lipase of 35.  CT A/P revealed a focal heterogenous enhancing/attenuating region of inferior R kidney, new from the comparison CT.  Imaging changes are favored to represent a focal pyelonephritis and potentially  developing phlegmon/abscess or potentially renal infarction. Leading differential includes pyelonephritis vs developing abscess.  Curious as to why no urinary symptoms and no CVA tenderness if pyelonephritis but will treat as such.  Can consider ultrasound or alternate imaging such as a transvaginal ultrasound method if needed due to worsening symptoms or failure of improvement.  -Admit to med-surg, attending Dr. Gwendolyn Grant  -S/p CTX x 1 in ED.  Will continue CTX per pharmacy in setting of suspicion for pyelonephritis  -If pain continues to worsen while on antibiotics, would consider repeat CT A/P  -IV NS @ 75 cc.hr given poor PO intake.  -LA ordered given concerns on read for renal infarction and possible infection.  If elevated, will trend.  -GC/Chlamydia ordered by ED  -Tylenol 650 mg Q6 PRN for pain  -K pad  -AM BMET and CBC  -Vitals per unit routine   Cough: patient in NAD, lungs clear and she's satting 98% on RA. Has not started doxycycline that was called in on 5/24. -Tessalon perles PRN for cough  - will not start doxy as the patient is on ceftriaxone  Hypokalemia. On admission, K+ 2.8.  Repleted in the ED with 40 Kdur x2. -Repeat BMET ordered for 7PM tonight -Daily BMET   HTN.  Hypotensive on admission with BP 101/73.  At home on Norvasc 10 mg, Hydralazine 10 mg TID, HCTZ 25 mg, lisinopril 40 mg and Lopressor 25 mg daily however states she has not taken them recently  From looking at old notes, it seems she has a h/o noncompliance, additionally, she was just tapered off  prednisone so that may be the etiology. No tachycardia, fever, or leukocytosis to suggest sepsis -Will hold home antihypertensives for now; can add on if needed -Monitor blood pressures   Acid reflux.  Stable.  At home on Prilosec 40 mg for heartburn.   -Substitute Protonix 40 mg daily   Sarcoidosis.  Stable.  Per chart review from PCP office, patient has recently completed chronic steroid use in March.  Seems like   -Continue to monitor.   History of CVA: Dysarthria and left facial numbness.  Approximately 2 years ago without residual symptoms per patient's report. From looking at old neurology notes, Dr. 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   At home on ASA 325 mg and Lipitor 10 mg daily.  -Continue daily aspirin - although unsure why it is 325mg  instead 81mg  -Continue Lipitor 10 mg   FEN/GI: Heart Healthy diet, IV NS @75  cc.hour  Prophylaxis: Heparin, Protonix  Disposition: Admit to med-surg for IVF and IV antibiotics, attending Dr. Gwendolyn GrantWalden  History of Present Illness:  Hannah Ellis is a 51 y.o. female presenting with RLQ pain.  She has had nausea, vomiting, and diarrhea on Wednesday.  Nausea and vomiting has since resolved on Wednesday, followed by the RLQ abdominal pain that has been constant since Thursday.  Pain is sharp and does not radiate.  Diarrhea occurred last this AM.  Patient notes right lower quadrant pain since Thursday.  Reports a fever at home to 101.6 on Thursday but has not had any fevers since. She denies dysuria, urinary urgency, urinary frequency, vaginal discharge, vulvar pruritus, h/o STDs.  Reports poor po intake since Wednesday and has not been taking any of her home medications.  She reports she has not taken anything for the pain.  Also with non-productive cough for approximately 1 week. Call into her pulmonologist on 5/24 and prescribed Doxycyline by Dr. Maple HudsonYoung, but hasn't started. No congestion, otalgias, rhinorrhea, chest pain, SOB noted.   She has had a c-section, BTL and appendectomy. Never had abdominal pain like this in the past. She has not taken anything for her symptoms; states she hasn't taken any of her medications since Wednesday due to not feeling well.   In the ED, the patient was  afebrile. CT revealed a "focal heterogeneous enhancing/attenuating region of the inferior right kidney, new from the comparison CT. Imaging changes are favored to represent a focal pyelonephritis and potentially developing phlegmon/abscess, or possibly renal infarction."  Pelvic exam by the ED provider was normal with blood in the vaginal vault (currently on period) per EDP. The patient was given a dose of ceftriaxone and FPTS was asked to admit.   Review Of Systems: Per HPI with the following additions:   Review of Systems  Constitutional: Positive for fever. Negative for chills and weight loss.  HENT: Negative for congestion, ear pain and sore throat.   Respiratory: Positive for cough. Negative for sputum production.   Cardiovascular: Negative for chest pain, claudication and leg swelling.  Gastrointestinal: Positive for abdominal pain, nausea and vomiting.  Genitourinary: Negative for dysuria, flank pain, frequency, hematuria and urgency.  Musculoskeletal: Negative for myalgias.  Skin: Negative for rash.  Neurological: Negative for dizziness, weakness and headaches.   Patient Active Problem List   Diagnosis Date Noted  . Abdominal pain  02/17/2017  . Pyelonephritis   . 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 Sarcoidosis 10/15/2016  . Encephalopathy, hypertensive 07/11/2015  . Anxiety state 07/11/2015  . Essential hypertension 07/11/2015  . TIA (transient ischemic attack) 05/09/2015  . Uncontrolled hypertension 05/09/2015  . Patient nonadherence 05/09/2015  . Microcytic anemia 05/09/2015   Past Medical History: Past Medical History:  Diagnosis Date  . Depression   . GERD (gastroesophageal reflux disease)   . Hypertension   . Sarcoidosis   . Stroke (HCC)   . TIA (transient ischemic attack) 05/09/2015  . 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 APPENDECTOMY    . CESAREAN SECTION  1998  .  EYE SURGERY Left ~ 1972  . FOOT SURGERY Right    "took some bone out of pinky and one next to it"  . TUBAL LIGATION  1998   Social History: Social History  Substance Use Topics  . Smoking status: Never Smoker  . Smokeless tobacco: Never Used  . Alcohol use No   Additional social history: never smoker, no drug use, drank some alcohol socially in youth  Please also refer to relevant sections of EMR.  Family History: Family History  Problem Relation Age of Onset  . Hypertension Mother   . Colon cancer Mother   . Hypertension Father   . Hypertension Brother    Allergies and Medications: No Known Allergies No current facility-administered medications on file prior to encounter.    Current Outpatient Prescriptions on File Prior to Encounter  Medication Sig Dispense Refill  . amLODipine (NORVASC) 10 MG tablet Take 1 tablet (10 mg total) by mouth daily. 30 tablet 0  . aspirin EC 325 MG EC tablet Take 1 tablet (325 mg total) by mouth daily. 30 tablet 0  . atorvastatin (LIPITOR) 10 MG tablet Take 1 tablet (10 mg total) by mouth daily at 6 PM. 30 tablet 0  . benzonatate (TESSALON) 100 MG capsule Take 1 capsule (100 mg total) by mouth 3 (three) times daily as needed for cough. 90 capsule 0  . Calcium 150 MG TABS Take by mouth daily.    Marland Kitchen doxycycline (VIBRA-TABS) 100 MG tablet Take 1 tablet (100 mg total) by mouth 2 (two) times daily. 14 tablet 0  . hydrALAZINE (APRESOLINE) 10 MG tablet Take 1 tablet (10 mg total) by mouth every 8 (eight) hours. 90 tablet 0  . hydrochlorothiazide (HYDRODIURIL) 25 MG tablet Take 1 tablet (25 mg total) by mouth daily. 30 tablet 0  . lisinopril (PRINIVIL,ZESTRIL) 40 MG tablet Take 1 tablet (40 mg total) by mouth daily. 30 tablet 0  . metoprolol tartrate (LOPRESSOR) 25 MG tablet Take 25 mg by mouth 2 (two) times daily.    Marland Kitchen omeprazole (PRILOSEC) 40 MG capsule Take 40 mg by mouth daily as needed (heartburn).   0  . Pediatric Multiple Vit-C-FA (FLINSTONES  GUMMIES OMEGA-3 DHA) CHEW Chew 1 tablet by mouth daily.     Marland Kitchen nystatin (MYCOSTATIN) 100000 UNIT/ML suspension Take 5 mLs (500,000 Units total) by mouth 3 (three) times daily. (Patient not taking: Reported on 02/17/2017) 60 mL 0   Objective: BP (!) 143/81 (BP Location: Left Arm)   Pulse 92   Temp 98.4 F (36.9 C) (Oral)   Resp 16   Ht 5\' 4"  (1.626 m)   Wt 180 lb (81.6 kg)   LMP 02/15/2017   SpO2 98%   BMI 30.90 kg/m  Exam: General: pleasant 51 yo F, lying in hospital bed, in NAD, with husband at bedside  Eyes: EOMI, PERRL ENTM: MMM, o/p clear. TM non-erythematous, non-bulging.  Neck: supple, no JVD  Cardiovascular: RRR no MRG, 2+ pedal pulses bilaterally  Respiratory: lungs clear to auscultation, no increased work of breathing  Gastrointestinal: soft, exquisitely TTP over RLQ, no rebound or guarding present, no ecchymoses noted, +bs No CVA tenderness MSK: no edema or tenderness bilaterally   Derm: warm, dry, no rashes present  Neuro: AOx4, no focal deficits, sensation intact Psych: normal affect   Labs and Imaging: CBC BMET   Recent Labs Lab 02/17/17 1213  WBC 8.3  HGB 10.7*  HCT 34.9*  PLT 156    Recent Labs Lab 02/17/17 1840  NA 136  K 2.8*  CL 97*  CO2 28  BUN 14  CREATININE 0.93  GLUCOSE 105*  CALCIUM 8.6*     U/A: large Hgb, trace LE, mucous, negative nitrites, 0-5 epis, 6-30 WBC Urine pregnancy negative  We prep: moderate WBCs, neg yeast, trich, and clue cells  Ct Abdomen Pelvis W Contrast  Result Date: 02/17/2017 CLINICAL DATA:  51 year old female with a history of lower right quadrant pain since Thursday. History of appendectomy EXAM: CT ABDOMEN AND PELVIS WITH CONTRAST TECHNIQUE: Multidetector CT imaging of the abdomen and pelvis was performed using the standard protocol following bolus administration of intravenous contrast. CONTRAST:  ISOVUE-300 IOPAMIDOL (ISOVUE-300) INJECTION 61% COMPARISON:  04/25/2008 FINDINGS: Lower chest: No acute  abnormality. Hepatobiliary: Unremarkable appearance of liver. Unremarkable gallbladder. Pancreas: Unremarkable pancreas Spleen: Unremarkable spleen Adrenals/Urinary Tract: Unremarkable adrenal glands. Right kidney demonstrates ill-defined heterogeneously enhancing/attenuating region at the inferior pole collecting system spanning cortex and the medullary region measuring approximately 3.1 cm by 3.5 cm on axial images and 3.4 cm on coronal images. No hydronephrosis. No nephrolithiasis. Unremarkable course of the right ureter. Unremarkable appearance of left kidney without hydronephrosis or nephrolithiasis. Stomach/Bowel: Unremarkable appearance of stomach and small bowel. No abnormal distention. No transition point. Surgical changes of prior appendectomy. Unremarkable appearance of the colon. No associated inflammatory changes. Minimal diverticular disease. Vascular/Lymphatic: No significant vascular calcifications. No aneurysm. Proximal femoral vasculature patent. Reproductive: Unremarkable appearance of uterus and the adnexa Other: Fact containing umbilical hernia. Musculoskeletal: No displaced fracture. Minimal degenerative changes of the visualized spine. No significant hip degenerative changes. IMPRESSION: There is a focal heterogeneous enhancing/attenuating region of the inferior right kidney, new from the comparison CT. Imaging changes are favored to represent a focal pyelonephritis and potentially developing phlegmon/abscess, or possibly renal infarction. Less likely, though also considered on the differential would be urothelial cell carcinoma or lymphoma. Correlation with urinalysis and urine cytology may be useful. These results were called by telephone at the time of interpretation on 02/17/2017 at 3:46 pm to Dr. Jaynie Crumble , who verbally acknowledged these results. Minimal diverticular disease without evidence of acute diverticulitis. Electronically Signed   By: Gilmer Mor D.O.   On: 02/17/2017  15:51   Freddrick March, MD 02/17/2017, 8:19 PM PGY-1, [facility] FPTS Intern pager: [phone], text pages welcome  Upper Level Addendum:  I have seen and evaluated this patient along with Dr. Nelson Chimes and reviewed the above note, making necessary revisions in purple.   Joanna Puff, MD [facility] Family Medicine Resident, PGY-3

## 2017-02-17 NOTE — ED Provider Notes (Signed)
CSN: [id number]     Arrival date & time 02/17/17  1015 History   None    Chief Complaint  Patient presents with  . Abdominal Pain   (Consider location/radiation/quality/duration/timing/severity/associated sxs/prior Treatment) 51 year old female presents to clinic with 5 day history of nausea, vomiting, diarrhea, and lower right abdominal pain. States the pain started 5 days ago after her annual Pap smear. She continues to have periods, however denies possibility of pregnancy, states that she had a "tubal" 20 years ago. She still has her gallbladder, however she has had an appendectomy.   The history is provided by the patient.  Abdominal Pain  Pain location:  RLQ Pain quality: aching, cramping and sharp   Pain radiates to:  Does not radiate Pain severity:  Severe Onset quality:  Gradual Duration:  5 days Timing:  Constant Progression:  Worsening Chronicity:  New Context: sick contacts   Context: not diet changes, not recent travel, not suspicious food intake and not trauma   Relieved by:  Nothing Worsened by:  Eating Ineffective treatments:  None tried Associated symptoms: anorexia, diarrhea, nausea and vomiting   Associated symptoms: no chills, no constipation, no cough, no fever and no melena     Past Medical History:  Diagnosis Date  . Depression   . GERD (gastroesophageal reflux disease)   . Hypertension   . Sarcoidosis   . Stroke (HCC)   . TIA (transient ischemic attack) 05/09/2015  . 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 APPENDECTOMY    . CESAREAN SECTION  1998  . EYE SURGERY Left ~ 1972  . FOOT SURGERY Right    "took some bone out of pinky and one next to it"  . TUBAL LIGATION  1998   Family History  Problem Relation Age of Onset  . Hypertension Mother   . Colon cancer Mother   . Hypertension Father   . Hypertension Brother    Social History  Substance Use Topics  . Smoking  status: Never Smoker  . Smokeless tobacco: Never Used  . Alcohol use No   OB History    No data available     Review of Systems  Constitutional: Negative for chills and fever.  HENT: Negative.   Respiratory: Negative for cough.   Gastrointestinal: Positive for abdominal pain, anorexia, diarrhea, nausea and vomiting. Negative for constipation and melena.  Musculoskeletal: Negative.   Skin: Negative.   Neurological: Negative.     Allergies  Patient has no known allergies.  Home Medications   Prior to Admission medications   Medication Sig Start Date End Date Taking? Authorizing Provider  amLODipine (NORVASC) 10 MG tablet Take 1 tablet (10 mg total) by mouth daily. 04/05/16   Tomasita Crumbleni, Adeleke, MD  aspirin EC 325 MG EC tablet Take 1 tablet (325 mg total) by mouth daily. 05/11/15   Joseph ArtVann, Jessica U, DO  atorvastatin (LIPITOR) 10 MG tablet Take 1 tablet (10 mg total) by mouth daily at 6 PM. 05/11/15   Joseph ArtVann, Jessica U, DO  benzonatate (TESSALON) 100 MG capsule Take 1 capsule (100 mg total) by mouth 3 (three) times daily as needed for cough. 02/10/17   Mannam, Colbert CoyerPraveen, MD  Calcium 150 MG TABS Take by mouth daily.    [provider]  doxycycline (VIBRA-TABS) 100 MG tablet Take 1 tablet (100 mg total) by mouth 2 (two) times daily. 02/13/17   Waymon BudgeYoung, Clinton D, MD  hydrALAZINE (  APRESOLINE) 10 MG tablet Take 1 tablet (10 mg total) by mouth every 8 (eight) hours. 05/11/15   Joseph Art, DO  hydrochlorothiazide (HYDRODIURIL) 25 MG tablet Take 1 tablet (25 mg total) by mouth daily. 05/11/15   Joseph Art, DO  lisinopril (PRINIVIL,ZESTRIL) 40 MG tablet Take 1 tablet (40 mg total) by mouth daily. 05/11/15   Joseph Art, DO  metoprolol tartrate (LOPRESSOR) 25 MG tablet Take 25 mg by mouth 2 (two) times daily.    [provider]  nystatin (MYCOSTATIN) 100000 UNIT/ML suspension Take 5 mLs (500,000 Units total) by mouth 3 (three) times daily. 12/18/16   Leslye Peer, MD  omeprazole  (PRILOSEC) 40 MG capsule  10/04/16   [provider]  Pediatric Multiple Vit-C-FA (FLINSTONES GUMMIES OMEGA-3 DHA) CHEW Chew by mouth.    [provider]   Meds Ordered and Administered this Visit  Medications - No data to display  BP 123/82 (BP Location: Left Arm)   Pulse 95   Temp 98.3 F (36.8 C) (Oral)   Resp 18   SpO2 100%  No data found.   Physical Exam  Constitutional: She is oriented to person, place, and time. Vital signs are normal. She appears well-developed and well-nourished. She appears ill. No distress.  HENT:  Head: Normocephalic.  Right Ear: External ear normal.  Left Ear: External ear normal.  Nose: Nose normal.  Mouth/Throat: Oropharynx is clear and moist.  Eyes: Conjunctivae are normal.  Neck: Normal range of motion.  Cardiovascular: Normal rate and regular rhythm.   Pulmonary/Chest: Effort normal and breath sounds normal.  Abdominal: Normal appearance. Bowel sounds are increased. There is tenderness in the right lower quadrant. There is no rebound and no CVA tenderness.  Neurological: She is alert and oriented to person, place, and time.  Skin: Skin is warm and dry. Capillary refill takes less than 2 seconds.  Psychiatric: She has a normal mood and affect. Her behavior is normal.  Nursing note and vitals reviewed.   Urgent Care Course     Procedures (including critical care time)  Labs Review Labs Reviewed - No data to display  Imaging Review No results found.    MDM   1. Right lower quadrant abdominal pain    Recommend further treatment and evaluation in the ER due to signs, symptoms, physical exam, and location of abdominal pain    Dorena Bodo, NP 02/17/17 1127

## 2017-02-17 NOTE — ED Triage Notes (Signed)
[facility] a [facility] with N/V/D x 5 days.

## 2017-02-17 NOTE — ED Provider Notes (Signed)
[facility]-EMERGENCY DEPT Provider Note   CSN: [id number] Arrival date & time: 02/17/17  1131     History   Chief Complaint Chief Complaint  Patient presents with  . Abdominal Pain  . Emesis    HPI Hannah Ellis is a 51 y.o. female.  HPI Hannah Ellis is a 51 y.o. female with history of acid reflux, hypertension, sarcoidosis, CVA, presents to emergency department complaining of right-sided abdominal pain. Patient states her symptoms started 4 days ago. She reports symptoms began with fever 101, nausea, vomiting, diarrhea, pain. She states that since then her diarrhea and nausea and vomiting have subsided. She also is no longer having fever. She states her right lower quadrant abdominal pain however persisted. She does admit that she had Pap smear the day before. She is currently on her menstrual cycle. She denies any vaginal discharge. She has not tried any medications to help her with her pain. No urinary symptoms. No back pain. History of appendectomy. Patient went to an urgent care and was sent here for further evaluation.   Past Medical History:  Diagnosis Date  . Depression   . GERD (gastroesophageal reflux disease)   . Hypertension   . Sarcoidosis   . Stroke (HCC)   . TIA (transient ischemic attack) 05/09/2015  . 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 Sarcoidosis 10/15/2016  . Encephalopathy, hypertensive 07/11/2015  . Anxiety state 07/11/2015  . Essential hypertension 07/11/2015  . TIA (transient ischemic attack) 05/09/2015  . Uncontrolled hypertension 05/09/2015  . Patient nonadherence 05/09/2015  . Microcytic anemia 05/09/2015    Past Surgical History:  Procedure Laterality Date  . APPENDECTOMY    . CESAREAN SECTION  1998  . EYE SURGERY Left ~ 1972  . FOOT SURGERY Right    "took some bone out of pinky and one next to it"  . TUBAL LIGATION  1998     OB History    No data available       Home Medications    Prior to Admission medications   Medication Sig Start Date End Date Taking? Authorizing Provider  amLODipine (NORVASC) 10 MG tablet Take 1 tablet (10 mg total) by mouth daily. 04/05/16   Tomasita Crumble, MD  aspirin EC 325 MG EC tablet Take 1 tablet (325 mg total) by mouth daily. 05/11/15   Joseph Art, DO  atorvastatin (LIPITOR) 10 MG tablet Take 1 tablet (10 mg total) by mouth daily at 6 PM. 05/11/15   Joseph Art, DO  benzonatate (TESSALON) 100 MG capsule Take 1 capsule (100 mg total) by mouth 3 (three) times daily as needed for cough. 02/10/17   Mannam, Colbert Coyer, MD  Calcium 150 MG TABS Take by mouth daily.    [provider]  doxycycline (VIBRA-TABS) 100 MG tablet Take 1 tablet (100 mg total) by mouth 2 (two) times daily. 02/13/17   Jetty Duhamel D, MD  hydrALAZINE (APRESOLINE) 10 MG tablet Take 1 tablet (10 mg total) by mouth every 8 (eight) hours. 05/11/15   Joseph Art, DO  hydrochlorothiazide (HYDRODIURIL) 25 MG tablet Take 1 tablet (25 mg total) by mouth daily. 05/11/15   Joseph Art, DO  lisinopril (PRINIVIL,ZESTRIL) 40 MG tablet Take 1 tablet (40 mg total) by mouth daily. 05/11/15   Joseph Art, DO  metoprolol tartrate (LOPRESSOR) 25 MG tablet Take 25 mg by mouth  2 (two) times daily.    [provider]  nystatin (MYCOSTATIN) 100000 UNIT/ML suspension Take 5 mLs (500,000 Units total) by mouth 3 (three) times daily. 12/18/16   Leslye Peer, MD  omeprazole (PRILOSEC) 40 MG capsule  10/04/16   [provider]  Pediatric Multiple Vit-C-FA (FLINSTONES GUMMIES OMEGA-3 DHA) CHEW Chew by mouth.    [provider]    Family History Family History  Problem Relation Age of Onset  . Hypertension Mother   . Colon cancer Mother   . Hypertension Father   . Hypertension Brother     Social History Social History  Substance Use Topics  . Smoking status: Never Smoker  .  Smokeless tobacco: Never Used  . Alcohol use No     Allergies   Patient has no known allergies.   Review of Systems Review of Systems  Constitutional: Positive for chills and fever.  Respiratory: Negative for cough, chest tightness and shortness of breath.   Cardiovascular: Negative for chest pain, palpitations and leg swelling.  Gastrointestinal: Positive for abdominal pain, diarrhea, nausea and vomiting.  Genitourinary: Positive for vaginal bleeding. Negative for dysuria, flank pain, pelvic pain, vaginal discharge and vaginal pain.  Musculoskeletal: Negative for arthralgias, myalgias, neck pain and neck stiffness.  Skin: Negative for rash.  Neurological: Negative for dizziness, weakness and headaches.  All other systems reviewed and are negative.    Physical Exam Updated Vital Signs BP 130/87 (BP Location: Left Arm)   Pulse 93   Temp 98.3 F (36.8 C) (Oral)   Resp 16   Ht 5\' 4"  (1.626 m)   Wt 81.6 kg (180 lb)   LMP 02/15/2017   SpO2 99%   BMI 30.90 kg/m   Physical Exam  Constitutional: She appears well-developed and well-nourished. No distress.  HENT:  Head: Normocephalic.  Eyes: Conjunctivae are normal.  Neck: Neck supple.  Cardiovascular: Normal rate, regular rhythm and normal heart sounds.   Pulmonary/Chest: Effort normal and breath sounds normal. No respiratory distress. She has no wheezes. She has no rales.  Abdominal: Soft. Bowel sounds are normal. She exhibits no distension. There is no tenderness. There is guarding. There is no rebound.  Right lower quadrant and right mid abdominal tenderness. No CVA tenderness.  Genitourinary:  Genitourinary Comments: Normal external genitalia. Normal vaginal canal. Moderate blood in vaginal canal with no clots. Cervix is normal, closed. No CMT. No uterine tenderness. Right adnexal tenderness.  No masses palpated.    Musculoskeletal: She exhibits no edema.  Neurological: She is alert.  Skin: Skin is warm and dry.   Psychiatric: She has a normal mood and affect. Her behavior is normal.  Nursing note and vitals reviewed.    ED Treatments / Results  Labs (all labs ordered are listed, but only abnormal results are displayed) Labs Reviewed  WET PREP, GENITAL - Abnormal; Notable for the following:       Result Value   WBC, Wet Prep HPF POC MODERATE (*)    All other components within normal limits  COMPREHENSIVE METABOLIC PANEL - Abnormal; Notable for the following:    Potassium 2.8 (*)    Chloride 99 (*)    Glucose, Bld 105 (*)    Calcium 8.8 (*)    Albumin 3.2 (*)    AST 52 (*)    ALT 70 (*)    All other components within normal limits  CBC - Abnormal; Notable for the following:    Hemoglobin 10.7 (*)    HCT 34.9 (*)  MCV 74.9 (*)    MCH 23.0 (*)    RDW 17.0 (*)    All other components within normal limits  URINALYSIS, ROUTINE W REFLEX MICROSCOPIC - Abnormal; Notable for the following:    Color, Urine AMBER (*)    APPearance HAZY (*)    Hgb urine dipstick LARGE (*)    Ketones, ur 5 (*)    Protein, ur 100 (*)    Leukocytes, UA TRACE (*)    Bacteria, UA RARE (*)    Squamous Epithelial / LPF 0-5 (*)    All other components within normal limits  URINE CULTURE  LIPASE, BLOOD  I-STAT BETA HCG BLOOD, ED ([facility], [facility], [facility] ONLY)  GC/[facility]YDIA PROBE AMP () [facility]    EKG  EKG Interpretation None       Radiology Ct Abdomen Pelvis W Contrast  Result Date: 02/17/2017 CLINICAL DATA:  51 year old female with a history of lower right quadrant pain since Thursday. History of appendectomy EXAM: CT ABDOMEN AND PELVIS WITH CONTRAST TECHNIQUE: Multidetector CT imaging of the abdomen and pelvis was performed using the standard protocol following bolus administration of intravenous contrast. CONTRAST:  100mL ISOVUE-300 IOPAMIDOL (ISOVUE-300) INJECTION 61% COMPARISON:  04/25/2008 FINDINGS: Lower chest: No acute abnormality. Hepatobiliary: Unremarkable appearance of liver. Unremarkable  gallbladder. Pancreas: Unremarkable pancreas Spleen: Unremarkable spleen Adrenals/Urinary Tract: Unremarkable adrenal glands. Right kidney demonstrates ill-defined heterogeneously enhancing/attenuating region at the inferior pole collecting system spanning cortex and the medullary region measuring approximately 3.1 cm by 3.5 cm on axial images and 3.4 cm on coronal images. No hydronephrosis. No nephrolithiasis. Unremarkable course of the right ureter. Unremarkable appearance of left kidney without hydronephrosis or nephrolithiasis. Stomach/Bowel: Unremarkable appearance of stomach and small bowel. No abnormal distention. No transition point. Surgical changes of prior appendectomy. Unremarkable appearance of the colon. No associated inflammatory changes. Minimal diverticular disease. Vascular/Lymphatic: No significant vascular calcifications. No aneurysm. Proximal femoral vasculature patent. Reproductive: Unremarkable appearance of uterus and the adnexa Other: Fact containing umbilical hernia. Musculoskeletal: No displaced fracture. Minimal degenerative changes of the visualized spine. No significant hip degenerative changes. IMPRESSION: There is a focal heterogeneous enhancing/attenuating region of the inferior right kidney, new from the comparison CT. Imaging changes are favored to represent a focal pyelonephritis and potentially developing phlegmon/abscess, or possibly renal infarction. Less likely, though also considered on the differential would be urothelial cell carcinoma or lymphoma. Correlation with urinalysis and urine cytology may be useful. These results were called by telephone at the time of interpretation on 02/17/2017 at 3:46 pm to Dr. Jaynie CrumbleATYANA Darilyn Storbeck , who verbally acknowledged these results. Minimal diverticular disease without evidence of acute diverticulitis. Electronically Signed   By: Gilmer MorJaime  Wagner D.O.   On: 02/17/2017 15:51    Procedures Procedures (including critical care  time)  Medications Ordered in ED Medications  ondansetron (ZOFRAN-ODT) 4 MG disintegrating tablet (not administered)  morphine 4 MG/ML injection 4 mg (not administered)  potassium chloride SA (K-DUR,KLOR-CON) CR tablet 40 mEq (not administered)  ondansetron (ZOFRAN-ODT) disintegrating tablet 4 mg (4 mg Oral Given 02/17/17 1208)     Initial Impression / Assessment and Plan / ED Course  I have reviewed the triage vital signs and the nursing notes.  Pertinent labs & imaging results that were available during my care of the patient were reviewed by me and considered in my medical decision making (see chart for details).     Pt in ED with right lower abdominal pain, and now resolved n/v/d and fever. Pt is significantly  tender in RLQ but no peritoneal signs. Labs showing hypokalemia, slightly elevated LFTs. Normal WBC. Will perform pelvic exam. Will get CT abd pelvis for further evaluation. I suspect gastrointestinal cause with n/v/d more than ovarian at this time. However if CT is negative, most likely will need [place]  4:20 PM CT as described above. Patient does have 6-30 WBCs in her urine and rare bacteria, although simply slightly contaminated. I sent cultures. I also started her on Rocephin to cover for possible infection. I spoke with family practice who will admit patient for further treatment. Discussed results with patient who agreed to the admission.  Vitals:   02/17/17 1153 02/17/17 1415  BP: 130/87 114/78  Pulse: 93 91  Resp: 16   Temp: 98.3 F (36.8 C)   TempSrc: Oral   SpO2: 99% 100%  Weight: 81.6 kg (180 lb)   Height: 5\' 4"  (1.626 m)      Final Clinical Impressions(s) / ED Diagnoses   Final diagnoses:  Pyelonephritis    New Prescriptions New Prescriptions   No medications on file     Jaynie Crumble, Cordelia Poche 02/17/17 1621    Long, Arlyss Repress, MD 02/17/17 Windell Moment

## 2017-02-17 NOTE — Discharge Instructions (Signed)
Because of your signs, symptoms, history, along with the location of your abdominal pain, I believe you will need further tests and evaluation than what is available here in the urgent care, I believe you need to go to the emergency room.

## 2017-02-17 NOTE — ED Triage Notes (Signed)
PT states RLQ pain, vomiting and diarrhea since Thursday.  [facility]

## 2017-02-17 NOTE — Progress Notes (Signed)
Potassium still low at 2.8.  Will replete with KDUR 40meQ TID x 3 doses, f/u BMET in the AM.  Joanna Puffrystal S. Dorsey, MD [facility] Resident  02/17/2017, 7:21 PM

## 2017-02-17 NOTE — ED Notes (Signed)
Patient transported to CT 

## 2017-02-18 DIAGNOSIS — E876 Hypokalemia: Secondary | ICD-10-CM

## 2017-02-18 LAB — CBC
HCT: 32.6 % — ABNORMAL LOW (ref 36.0–46.0)
Hemoglobin: 9.7 g/dL — ABNORMAL LOW (ref 12.0–15.0)
MCH: 22.4 pg — AB (ref 26.0–34.0)
MCHC: 29.8 g/dL — ABNORMAL LOW (ref 30.0–36.0)
MCV: 75.1 fL — AB (ref 78.0–100.0)
PLATELETS: 154 10*3/uL (ref 150–400)
RBC: 4.34 MIL/uL (ref 3.87–5.11)
RDW: 16.9 % — AB (ref 11.5–15.5)
WBC: 6.8 10*3/uL (ref 4.0–10.5)

## 2017-02-18 LAB — BASIC METABOLIC PANEL
Anion gap: 8 (ref 5–15)
BUN: 12 mg/dL (ref 6–20)
CHLORIDE: 103 mmol/L (ref 101–111)
CO2: 27 mmol/L (ref 22–32)
CREATININE: 0.84 mg/dL (ref 0.44–1.00)
Calcium: 8.3 mg/dL — ABNORMAL LOW (ref 8.9–10.3)
GFR calc Af Amer: >60 mL/min (ref >60)
GFR calc non Af Amer: >60 mL/min (ref >60)
Glucose, Bld: 96 mg/dL (ref 65–99)
Potassium: 3.1 mmol/L — ABNORMAL LOW (ref 3.5–5.1)
Sodium: 138 mmol/L (ref 135–145)

## 2017-02-18 LAB — URINE CULTURE: CULTURE: NO GROWTH

## 2017-02-18 LAB — GC/CHLAMYDIA PROBE AMP (~~LOC~~) NOT AT ARMC
CHLAMYDIA, DNA PROBE: NEGATIVE
Neisseria Gonorrhea: NEGATIVE

## 2017-02-18 LAB — MAGNESIUM: Magnesium: 2.3 mg/dL (ref 1.7–2.4)

## 2017-02-18 LAB — HIV ANTIBODY (ROUTINE TESTING W REFLEX): HIV SCREEN 4TH GENERATION: NONREACTIVE

## 2017-02-18 NOTE — Progress Notes (Signed)
Family [facility] Teaching Service Daily Progress Note Intern Pager: [phone]  Patient name: Hannah Ellis Medical record number: [id number] Date of birth: 10/17/1965 Age: 51 y.o. Gender: female  Primary Care Provider: Maudie Flakes, FNP Consultants: None Code Status: Full  Assessment and Plan: Hannah Ellis is a 52 y.o. female presenting with R-sided abdominal pain x 3 days.  PMH is significant for acid reflux, HTN, sarcoidosis, and CVA.    #RLQ Abdominal pain,Nausea,Vomiting, acute, resolving. Patient endorsing RLQ abdominal pain x 3 days duration accompanied with nausea, vomiting and poor po since last wednesday. Patient also reported fever to 101 at home. UA neg, CT imaging concerning for pyelonephritis though still a possibility for phlegmon/abscess or potentially renal infarction. GU process have been also ruled out s/p vaginal exam in the ED. Low threshold for further imaging i.e. Transvaginal U/S if patient worsens clinically. No signs of acute infection, lactic acid wnl. --Continue ceftriaxone 1g daily --Follow up GC/Chlamydia  --Tylenol 650 mg Q6 PRN for pain  --Zofran 4 mg prn --Patient will need follow up for her renal CT imaging finding in the outpatient settings.  #Cough, stable  Patient in NAD, oxygen saturation this morning is 98%. Patient never started doxycycline that was called in on 5/24. --Continue with essalon perles PRN for cough  --Order CXR if respiratory change   #Hypokalemia, improving On admission, K+ 2.8. This morning K+ was 3.1. --Will follow up on BMP --Replete as needed  #HTN Hypotensive on admission with BP 101/73.  At home on Norvasc 10 mg, Hydralazine 10 mg TID, HCTZ 25 mg, lisinopril 40 mg and Lopressor 25 mg daily however states she has not taken them recently. This morning BP is 125/70. Will continue to hold BP meds.  --Will resume BP meds as needed --Given no history of CAD or cardiac pathology lopressor will be decrease to 12.5 mg  prior to discharge. Patient is already on a significant BP regimen.  #Acid reflux, stable.   At home on Prilosec 40 mg for heartburn.   --Continue Protonix 40 mg daily   #Sarcoidosis, stable.  Per chart review from PCP office, patient has recently completed chronic steroid use in March.  --Will continue to monitor.   #History of CVA: Dysarthria and left facial numbness.  Approximately 2 years ago without residual symptoms per patient's report. From looking at old neurology notes, Dr. 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   At home on ASA 325 mg and Lipitor 10 mg daily.  --Will need clarification with neurology team about her aspirin regimen as she is currently on 325 mg instead of 81 mg. --Continue Lipitor 10 mg   FEN/GI: Heart Healthy Diet PPx:  Heparin, protonix  Disposition: Home pending clinical improvement  Subjective:  Patient is feeling better this morning with no significant complaints, abdominal pain has essentially resolved she denies fever, chills, nausea and vomiting since admission.patient is tolerating a diet.  Objective: Temp:  [98.3 F (36.8 C)-98.4 F (36.9 C)] 98.3 F (36.8 C) (05/29 0454) Pulse Rate:  [79-95] 85 (05/29 0454) Resp:  [16-18] 16 (05/29 0454) BP: (98-143)/(32-87) 113/64 (05/29 0454) SpO2:  [98 %-100 %] 98 % (05/29 0454) Weight:  [180 lb (81.6 kg)] 180 lb (81.6 kg) (05/28 1153)   Physical Exam: General: pleasant 51 yo F, lying  in hospital bed, in NAD able to participate in exam Eyes: EOMI, PERRL ENTM: MMM, o/p clear. TM non-erythematous, non-bulging.  Neck: supple, no JVD  Cardiovascular: RRR no MRG, 2+ pedal pulses bilaterally  Respiratory: lungs clear to auscultation, no increased work of breathing  Gastrointestinal: soft, exquisitely TTP over RLQ, no rebound  or guarding present, no ecchymoses noted, +bs No CVA tenderness MSK: no edema or tenderness bilaterally   Derm: warm, dry, no rashes present  Neuro: AOx4, no focal deficits, sensation intact Psych: normal affect   Laboratory:   Recent Labs Lab 02/17/17 1213 02/18/17 0536  WBC 8.3 6.8  HGB 10.7* 9.7*  HCT 34.9* 32.6*  PLT 156 154    Recent Labs Lab 02/17/17 1213 02/17/17 1840 02/18/17 0536  NA 138 136 138  K 2.8* 2.8* 3.1*  CL 99* 97* 103  CO2 28 28 27   BUN 15 14 12   CREATININE 0.99 0.93 0.84  CALCIUM 8.8* 8.6* 8.3*  PROT 7.0  --   --   BILITOT 0.6  --   --   ALKPHOS 64  --   --   ALT 70*  --   --   AST 52*  --   --   GLUCOSE 105* 105* 96     Imaging/Diagnostic Tests: Ct Abdomen Pelvis W Contrast  Result Date: 02/17/2017 CLINICAL DATA:  51 year old female with a history of lower right quadrant pain since Thursday. History of appendectomy EXAM: CT ABDOMEN AND PELVIS WITH CONTRAST TECHNIQUE: IMPRESSION: There is a focal heterogeneous enhancing/attenuating region of the inferior right kidney, new from the comparison CT. Imaging changes are favored to represent a focal pyelonephritis and potentially developing phlegmon/abscess, or possibly renal infarction. Less likely, though also considered on the differential would be urothelial cell carcinoma or lymphoma. Correlation with urinalysis and urine cytology may be useful. These results were called by telephone at the time of interpretation on 02/17/2017 at 3:46 pm to Dr. Jaynie CrumbleATYANA Ellis , who verbally acknowledged these results. Minimal diverticular disease without evidence of acute diverticulitis. Electronically Signed   By: Hannah MorJaime  Ellis D.O.   On: 02/17/2017 15:51     Hannah Neighboursiallo, Hannah Hofland, MD 02/18/2017, 7:17 AM PGY-1, Hannah Ellis FPTS Intern pager: [phone], text pages welcome

## 2017-02-19 LAB — BASIC METABOLIC PANEL
Anion gap: 9 (ref 5–15)
BUN: 8 mg/dL (ref 6–20)
CALCIUM: 8.8 mg/dL — AB (ref 8.9–10.3)
CHLORIDE: 106 mmol/L (ref 101–111)
CO2: 25 mmol/L (ref 22–32)
CREATININE: 0.7 mg/dL (ref 0.44–1.00)
GFR calc Af Amer: >60 mL/min (ref >60)
GFR calc non Af Amer: >60 mL/min (ref >60)
Glucose, Bld: 97 mg/dL (ref 65–99)
Potassium: 4 mmol/L (ref 3.5–5.1)
SODIUM: 140 mmol/L (ref 135–145)

## 2017-02-19 LAB — CBC
HCT: 34.5 % — ABNORMAL LOW (ref 36.0–46.0)
HEMOGLOBIN: 10.7 g/dL — AB (ref 12.0–15.0)
MCH: 23.4 pg — AB (ref 26.0–34.0)
MCHC: 31 g/dL (ref 30.0–36.0)
MCV: 75.5 fL — ABNORMAL LOW (ref 78.0–100.0)
Platelets: 192 10*3/uL (ref 150–400)
RBC: 4.57 MIL/uL (ref 3.87–5.11)
RDW: 17.6 % — AB (ref 11.5–15.5)
WBC: 6.4 10*3/uL (ref 4.0–10.5)

## 2017-02-19 NOTE — Progress Notes (Signed)
Family Medicine Teaching Service Daily Progress Note Intern Pager: [phone]  Patient name: Hannah Ellis Medical record number: [id number] Date of birth: December 06, 1965 Age: 51 y.o. Gender: female  Primary Care Provider: Maudie Flakes, FNP Consultants: None Code Status: Full  Assessment and Plan: [name] is a 51 y.o. female with a past medical history significant foracid reflux, HTN, sarcoidosis, and CVA  presented with R-sided abdominal pain x3 days.  #RLQ Abdominal pain,Nausea,Vomiting, acute, resolving. Patient endorsing RLQ abdominal pain x 3 days duration accompanied with nausea, vomiting and poor po since last wednesday. Patient also reported fever to 101 at home. UA neg, CT imaging concerning for pyelonephritis though still a possibility for phlegmon/abscess or potentially renal infarction. GU process have been also ruled out s/p vaginal exam in the ED. Low threshold for further imaging i.e. Transvaginal U/S if patient worsens clinically. No signs of acute infection, lactic acid wnl. GC/CH negative. --Continue ceftriaxone 1g daily --Tylenol 650 mg Q6 PRN for pain  --Zofran 4 mg prn --Patient will need follow up for her renal CT imaging finding in the outpatient settings.  #Cough, stable  Patient in NAD, oxygen saturation this morning is 98%. Patient never started doxycycline that was called in on 5/24. --Continue with Tessalon perles PRN for cough  --Order CXR if respiratory change   #Hypokalemia, improving On admission, K+ 2.8. This morning K+ was  --Will follow up on BMP --Replete as needed  #HTN Hypotensive on admission with BP 101/73.  At home on Norvasc 10 mg, Hydralazine 10 mg TID, HCTZ 25 mg, lisinopril 40 mg and Lopressor 25 mg daily however states she has not taken them recently. This morning BP is 133/80. Will continue to hold BP meds.  --Will resume BP meds as needed --Given no history of CAD or cardiac pathology lopressor will be decrease to 12.5 mg  prior to discharge. Patient is already on a significant BP regimen.  #Acid reflux, stable.   At home on Prilosec 40 mg for heartburn.   --Continue Protonix 40 mg daily   #Sarcoidosis, stable.  Per chart review from PCP office, patient has recently completed chronic steroid use in March.  --Will continue to monitor.   #History of CVA: Dysarthria and left facial numbness.  Approximately 2 years ago without residual symptoms per patient's report. From looking at old neurology notes, Dr. 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   At home on ASA 325 mg and Lipitor 10 mg daily.  --Will need clarification with neurology team about her aspirin regimen as she is currently on 325 mg instead of 81 mg. --Continue Lipitor 10 mg   FEN/GI: Heart Healthy Diet PPx:  Heparin, protonix  Disposition:  Stable and ready for discharge home  Subjective:  Patient is feeling much better, no complaints overnight.  Patient desire to go home today. She continue to deny fever, chills, nausea,  Vomiting and endorses good po intake.  Objective: Temp:  [97.8 F (36.6 C)-98.6 F (37 C)] 97.8 F (36.6 C) (05/30 0607) Pulse Rate:  [80-86] 80 (05/30 0607) Resp:  [17-18] 17 (05/30 0607) BP: (133-134)/(80-85) 133/80 (05/30 0607) SpO2:  [96 %-100 %] 97 % (05/30 1478)   Physical Exam: General: pleasant 51 yo F, lying in hospital bed, in NAD able to participate in exam Eyes:  EOMI, PERRL ENTM: MMM, o/p clear. TM non-erythematous, non-bulging.  Neck: supple, no JVD  Cardiovascular: RRR no MRG, 2+ pedal pulses bilaterally  Respiratory: lungs clear to auscultation, no increased work of breathing  Gastrointestinal: soft, exquisitely TTP over RLQ, no rebound or guarding present, no ecchymoses noted, +bs No CVA tenderness MSK: no edema or  tenderness bilaterally   Derm: warm, dry, no rashes present  Neuro: AOx4, no focal deficits, sensation intact Psych: normal affect   Laboratory:   Recent Labs Lab 02/17/17 1213 02/18/17 0536  WBC 8.3 6.8  HGB 10.7* 9.7*  HCT 34.9* 32.6*  PLT 156 154    Recent Labs Lab 02/17/17 1213 02/17/17 1840 02/18/17 0536  NA 138 136 138  K 2.8* 2.8* 3.1*  CL 99* 97* 103  CO2 28 28 27   BUN 15 14 12   CREATININE 0.99 0.93 0.84  CALCIUM 8.8* 8.6* 8.3*  PROT 7.0  --   --   BILITOT 0.6  --   --   ALKPHOS 64  --   --   ALT 70*  --   --   AST 52*  --   --   GLUCOSE 105* 105* 96     Imaging/Diagnostic Tests: Ct Abdomen Pelvis W Contrast  Result Date: 02/17/2017 CLINICAL DATA:  51 year old female with a history of lower right quadrant pain since Thursday. History of appendectomy EXAM: CT ABDOMEN AND PELVIS WITH CONTRAST TECHNIQUE: IMPRESSION: There is a focal heterogeneous enhancing/attenuating region of the inferior right kidney, new from the comparison CT. Imaging changes are favored to represent a focal pyelonephritis and potentially developing phlegmon/abscess, or possibly renal infarction. Less likely, though also considered on the differential would be urothelial cell carcinoma or lymphoma. Correlation with urinalysis and urine cytology may be useful. These results were called by telephone at the time of interpretation on 02/17/2017 at 3:46 pm to Dr. Jaynie CrumbleATYANA KIRICHENKO , who verbally acknowledged these results. Minimal diverticular disease without evidence of acute diverticulitis. Electronically Signed   By: Gilmer MorJaime  Wagner D.O.   On: 02/17/2017 15:51     Lovena Neighboursiallo, Brayden Betters, MD 02/19/2017, 8:02 AM PGY-1, [place] [facility] FPTS Intern pager: [phone], text pages welcome

## 2017-02-19 NOTE — Care Management Note (Signed)
Case Management Note  Patient Details  Name: Hannah Ellis MRN: [id number] Date of Birth: 02-Jan-1966  Subjective/Objective:                    Action/Plan:  No discharge needs identified  Expected Discharge Date:                  Expected Discharge Plan:  Home/Self Care  In-House Referral:     Discharge planning Services     Post Acute Care Choice:    Choice offered to:     DME Arranged:    DME Agency:     HH Arranged:    HH Agency:     Status of Service:  Completed, signed off  If discussed at [employer] Length of Stay Meetings, dates discussed:    Additional Comments:  [name], [name], RN 02/19/2017, 12:02 PM

## 2017-02-19 NOTE — Discharge Summary (Signed)
Family Medicine Teaching [facility] Discharge Summary  Patient name: Hannah Ellis Medical record number: [id number] Date of birth: 1966/01/20 Age: 51 y.o. Gender: female Date of Admission: 02/17/2017  Date of Discharge: 02/19/2017 Admitting Physician: Hannah GrimJeffrey H Walden, MD  Primary Care Provider: Maudie FlakesAnderson, Shane D, Ellis Consultants: None  Indication for Hospitalization: Abdominal pain, nausea, and vomiting  Discharge Diagnoses/Problem List:  Abdominal pain GERD HTN Sarcoidosis CVA  Disposition: Home  Discharge Condition: Stable, and well appearing  Discharge Exam:  General: NAD, pleasant, able to participate in exam Cardiac: RRR, normal heart sounds, no murmurs. 2+ radial and PT pulses bilaterally Respiratory: CTAB, normal effort, No wheezes, rales or rhonchi Abdomen: soft, nontender, nondistended, no hepatic or splenomegaly, +BS Extremities: no edema or cyanosis. WWP. Skin: warm and dry, no rashes noted Neuro: alert and oriented x4, no focal deficits Psych: Normal affect and mood  Brief Hospital Course:  Patient is a 51 yo female who presented with 3 days of R sided abdominal pain, nausea and vomiting. Patient also reported fever at home . On admission UA was negative and CT scan was suspicious for pyelonephritis vs developing abscess vs possible renal infarction. There was also a possibility for urothelial cell carcinoma or lymphoma. Patient was started on IV ceftriaxone and IVF and symptoms quickly resolved. Urine cultures showed no growth. Patient continue to improve throughout her hospitalization and was stable for discharge on hospital day 2. Etiology for abdominal pain was unclear by the time of discharge, however patient was given strict return precautions. Patient will follow up with PCP upon discharge.  Issues for Follow Up:  1. Follow up on renal findings, recommend repeat CT abdomen/pelvis in 4-6 weeks 2. Patient has a history of CVA and was started on  Aspirin. She is currently on 325 mg daily which is different from expected 81 mg aspirin prophylaxis.   Significant Procedures: None   Significant Labs and Imaging:   Recent Labs Lab 02/17/17 1213 02/18/17 0536 02/19/17 0839  WBC 8.3 6.8 6.4  HGB 10.7* 9.7* 10.7*  HCT 34.9* 32.6* 34.5*  PLT 156 154 192    Recent Labs Lab 02/17/17 1213 02/17/17 1840 02/18/17 0536 02/18/17 0800 02/19/17 0839  NA 138 136 138  --  140  K 2.8* 2.8* 3.1*  --  4.0  CL 99* 97* 103  --  106  CO2 28 28 27   --  25  GLUCOSE 105* 105* 96  --  97  BUN 15 14 12   --  8  CREATININE 0.99 0.93 0.84  --  0.70  CALCIUM 8.8* 8.6* 8.3*  --  8.8*  MG  --   --   --  2.3  --   ALKPHOS 64  --   --   --   --   AST 52*  --   --   --   --   ALT 70*  --   --   --   --   ALBUMIN 3.2*  --   --   --   --     Results/Tests Pending at Time of Discharge: None  Discharge Medications:  Allergies as of 02/19/2017   No Known Allergies     Medication List    STOP taking these medications   doxycycline 100 MG tablet Commonly known as:  VIBRA-TABS   nystatin 100000 UNIT/ML suspension Commonly known as:  MYCOSTATIN     TAKE these medications   amLODipine 10 MG tablet Commonly known as:  NORVASC Take 1  tablet (10 mg total) by mouth daily.   aspirin 325 MG EC tablet Take 1 tablet (325 mg total) by mouth daily.   atorvastatin 10 MG tablet Commonly known as:  LIPITOR Take 1 tablet (10 mg total) by mouth daily at 6 PM.   benzonatate 100 MG capsule Commonly known as:  TESSALON Take 1 capsule (100 mg total) by mouth 3 (three) times daily as needed for cough.   Calcium 150 MG Tabs Take by mouth daily.   FLINSTONES GUMMIES OMEGA-3 DHA Chew Chew 1 tablet by mouth daily.   hydrALAZINE 10 MG tablet Commonly known as:  APRESOLINE Take 1 tablet (10 mg total) by mouth every 8 (eight) hours.   hydrochlorothiazide 25 MG tablet Commonly known as:  HYDRODIURIL Take 1 tablet (25 mg total) by mouth daily.    lisinopril 40 MG tablet Commonly known as:  PRINIVIL,ZESTRIL Take 1 tablet (40 mg total) by mouth daily.   metoprolol tartrate 25 MG tablet Commonly known as:  LOPRESSOR Take 25 mg by mouth 2 (two) times daily.   omeprazole 40 MG capsule Commonly known as:  PRILOSEC Take 40 mg by mouth daily as needed (heartburn).       Discharge Instructions: Please refer to Patient Instructions section of EMR for full details.  Patient was counseled important signs and symptoms that should prompt return to medical care, changes in medications, dietary instructions, activity restrictions, and follow up appointments.   Follow-Up Appointments: Patient will make follow up appointment with PCP upon discharge  Lovena Neighbours, MD 02/19/2017, 2:03 PM PGY-1, [facility]

## 2017-02-24 ENCOUNTER — Ambulatory Visit: Payer: BLUE CROSS/BLUE SHIELD | Admitting: Emergency Medicine

## 2017-03-19 ENCOUNTER — Ambulatory Visit: Payer: BLUE CROSS/BLUE SHIELD | Admitting: Neurology

## 2017-04-22 ENCOUNTER — Ambulatory Visit: Payer: Self-pay

## 2017-04-22 ENCOUNTER — Other Ambulatory Visit: Payer: Self-pay | Admitting: Occupational Medicine

## 2017-04-22 DIAGNOSIS — M25511 Pain in right shoulder: Principal | ICD-10-CM

## 2017-04-22 DIAGNOSIS — G8929 Other chronic pain: Secondary | ICD-10-CM

## 2017-05-20 ENCOUNTER — Ambulatory Visit: Payer: BLUE CROSS/BLUE SHIELD | Admitting: Emergency Medicine

## 2017-06-01 ENCOUNTER — Emergency Department (HOSPITAL_COMMUNITY)
Admission: EM | Admit: 2017-06-01 | Discharge: 2017-06-01 | Disposition: A | Discharge status: Home / Self Care | Payer: BLUE CROSS/BLUE SHIELD | Attending: Emergency Medicine | Admitting: Emergency Medicine

## 2017-06-01 ENCOUNTER — Encounter (HOSPITAL_COMMUNITY): Payer: Self-pay

## 2017-06-01 ENCOUNTER — Emergency Department (HOSPITAL_COMMUNITY): Payer: BLUE CROSS/BLUE SHIELD

## 2017-06-01 DIAGNOSIS — Z79899 Other long term (current) drug therapy: Secondary | ICD-10-CM | POA: Insufficient documentation

## 2017-06-01 DIAGNOSIS — N924 Excessive bleeding in the premenopausal period: Secondary | ICD-10-CM | POA: Diagnosis not present

## 2017-06-01 DIAGNOSIS — I10 Essential (primary) hypertension: Secondary | ICD-10-CM | POA: Diagnosis not present

## 2017-06-01 DIAGNOSIS — Z8673 Personal history of transient ischemic attack (TIA), and cerebral infarction without residual deficits: Secondary | ICD-10-CM | POA: Diagnosis not present

## 2017-06-01 DIAGNOSIS — R1030 Lower abdominal pain, unspecified: Secondary | ICD-10-CM | POA: Diagnosis present

## 2017-06-01 DIAGNOSIS — Z7982 Long term (current) use of aspirin: Secondary | ICD-10-CM | POA: Diagnosis not present

## 2017-06-01 LAB — WET PREP, GENITAL
Clue Cells Wet Prep HPF POC: NONE SEEN
Sperm: NONE SEEN
TRICH WET PREP: NONE SEEN
WBC, Wet Prep HPF POC: NONE SEEN
YEAST WET PREP: NONE SEEN

## 2017-06-01 LAB — COMPREHENSIVE METABOLIC PANEL
ALBUMIN: 3.6 g/dL (ref 3.5–5.0)
ALT: 17 U/L (ref 14–54)
AST: 19 U/L (ref 15–41)
Alkaline Phosphatase: 64 U/L (ref 38–126)
Anion gap: 6 (ref 5–15)
BUN: 13 mg/dL (ref 6–20)
CHLORIDE: 104 mmol/L (ref 101–111)
CO2: 26 mmol/L (ref 22–32)
Calcium: 9.4 mg/dL (ref 8.9–10.3)
Creatinine, Ser: 0.75 mg/dL (ref 0.44–1.00)
GFR calc Af Amer: >60 mL/min (ref >60)
GLUCOSE: 116 mg/dL — AB (ref 65–99)
POTASSIUM: 3.5 mmol/L (ref 3.5–5.1)
Sodium: 136 mmol/L (ref 135–145)
Total Bilirubin: 0.3 mg/dL (ref 0.3–1.2)
Total Protein: 6.8 g/dL (ref 6.5–8.1)

## 2017-06-01 LAB — LIPASE, BLOOD: LIPASE: 36 U/L (ref 11–51)

## 2017-06-01 LAB — CBC
HEMATOCRIT: 36.6 % (ref 36.0–46.0)
HEMOGLOBIN: 11.5 g/dL — AB (ref 12.0–15.0)
MCH: 23.4 pg — AB (ref 26.0–34.0)
MCHC: 31.4 g/dL (ref 30.0–36.0)
MCV: 74.4 fL — ABNORMAL LOW (ref 78.0–100.0)
Platelets: 177 10*3/uL (ref 150–400)
RBC: 4.92 MIL/uL (ref 3.87–5.11)
RDW: 16.5 % — AB (ref 11.5–15.5)
WBC: 8.1 10*3/uL (ref 4.0–10.5)

## 2017-06-01 LAB — URINALYSIS, ROUTINE W REFLEX MICROSCOPIC
Bilirubin Urine: NEGATIVE
Glucose, UA: NEGATIVE mg/dL
Ketones, ur: NEGATIVE mg/dL
Leukocytes, UA: NEGATIVE
Nitrite: NEGATIVE
PROTEIN: 30 mg/dL — AB
Specific Gravity, Urine: 1.02 (ref 1.005–1.030)
pH: 6 (ref 5.0–8.0)

## 2017-06-01 LAB — I-STAT BETA HCG BLOOD, ED (MC, WL, AP ONLY)

## 2017-06-01 MED ORDER — SODIUM CHLORIDE 0.9 % IV BOLUS (SEPSIS)
1000.0000 mL | Freq: Once | INTRAVENOUS | Status: AC
  Administered 2017-06-01: 1000 mL via INTRAVENOUS

## 2017-06-01 MED ORDER — MEDROXYPROGESTERONE ACETATE 10 MG PO TABS
10.0000 mg | ORAL_TABLET | Freq: Once | ORAL | Status: AC
  Administered 2017-06-01: 10 mg via ORAL
  Filled 2017-06-01: qty 1

## 2017-06-01 MED ORDER — KETOROLAC TROMETHAMINE 10 MG PO TABS: 10.0000 mg | ORAL_TABLET | Freq: Four times a day (QID) | ORAL | 0 refills | Status: DC | PRN

## 2017-06-01 MED ORDER — MEDROXYPROGESTERONE ACETATE 10 MG PO TABS: 10.0000 mg | ORAL_TABLET | Freq: Every day | ORAL | 0 refills | Status: DC

## 2017-06-01 MED ORDER — KETOROLAC TROMETHAMINE 30 MG/ML IJ SOLN
30.0000 mg | Freq: Once | INTRAMUSCULAR | Status: AC
  Administered 2017-06-01: 30 mg via INTRAVENOUS
  Filled 2017-06-01: qty 1

## 2017-06-01 NOTE — ED Notes (Signed)
EDP at bedside for pelvic exam 

## 2017-06-01 NOTE — ED Notes (Signed)
Pelvic done by Dr. Particia NearingHaviland and Kenney Housemananya - EMT assisted.

## 2017-06-01 NOTE — ED Notes (Signed)
Haviland MD at bedside

## 2017-06-01 NOTE — ED Notes (Signed)
Pelvic cart set up at bedside  

## 2017-06-01 NOTE — ED Provider Notes (Signed)
[facility]-EMERGENCY DEPT Provider Note   CSN: [id number] Arrival date & time: 06/01/17  1720     History   Chief Complaint Chief Complaint  Patient presents with  . Abdominal Pain    HPI Hannah Ellis is a 51 y.o. female.  Pt presents to the ED today with abdominal pain and abnormal periods.  The pt has had normal periods her whole life.  She missed her period for 2 months in a row, then started her period and has now been bleeding for 9 days.  Pt also has rlq pain.  She denies any f/c or n/v.      Past Medical History:  Diagnosis Date  . Arthritis    "knees" (02/17/2017)  . 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 Depression   . GERD (gastroesophageal reflux disease)   . Hypertension   . Pneumonia ~ 2017  . Sarcoidosis, lung (HCC)   . Stroke (HCC) 05/09/2015   "vs TIA; mild"   . TIA (transient ischemic attack) 05/09/2015   "vs CVA; denies residual on 02/17/2017)    Patient Active Problem List   Diagnosis Date Noted  . Hypokalemia   . Abdominal pain 02/17/2017  . Pyelonephritis   . 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 Sarcoidosis 10/15/2016  . Encephalopathy, hypertensive 07/11/2015  . Anxiety state 07/11/2015  . Essential hypertension 07/11/2015  . TIA (transient ischemic attack) 05/09/2015  . Uncontrolled hypertension 05/09/2015  . Patient nonadherence 05/09/2015  . Microcytic anemia 05/09/2015    Past Surgical History:  Procedure Laterality Date  . APPENDECTOMY    . CESAREAN SECTION  1998  . EYE SURGERY Left ~ 1972  . FOOT SURGERY Right    "took some bone out of pinky and one next to it"  . TUBAL LIGATION  1998    OB History    No data available       Home Medications    Prior to Admission medications   Medication Sig Start Date End Date Taking? Authorizing Provider  amLODipine (NORVASC) 10 MG tablet Take 1 tablet (10 mg total) by mouth daily. 04/05/16  Yes Tomasita Crumble, MD  aspirin EC 325 MG EC tablet Take 1 tablet (325 mg total) by mouth daily. 05/11/15   Yes Marlin Canary U, DO  atorvastatin (LIPITOR) 10 MG tablet Take 1 tablet (10 mg total) by mouth daily at 6 PM. 05/11/15  Yes Vann, Jessica U, DO  benzonatate (TESSALON) 100 MG capsule Take 1 capsule (100 mg total) by mouth 3 (three) times daily as needed for cough. 02/10/17  Yes Mannam, Praveen, MD  hydrALAZINE (APRESOLINE) 25 MG tablet Take 25 mg by mouth 2 (two) times daily. 05/22/17  Yes [provider]  hydrochlorothiazide (HYDRODIURIL) 25 MG tablet Take 1 tablet (25 mg total) by mouth daily. 05/11/15  Yes Vann, Jessica U, DO  lisinopril (PRINIVIL,ZESTRIL) 40 MG tablet Take 1 tablet (40 mg total) by mouth daily. 05/11/15  Yes Marlin Canary U, DO  metoprolol tartrate (LOPRESSOR) 25 MG tablet Take 25 mg by mouth 2 (two) times daily.   Yes [provider]  omeprazole (PRILOSEC) 40 MG capsule Take 40 mg by mouth daily as needed (heartburn).  10/04/16  Yes [provider]  Pediatric Multiple Vit-C-FA (FLINSTONES GUMMIES OMEGA-3 DHA) CHEW Chew 1 tablet by mouth daily.    Yes [provider]  triamcinolone cream (KENALOG) 0.1 % Apply 1 application topically daily. Dry skin 05/12/17  Yes [provider]  hydrALAZINE (APRESOLINE) 10 MG  tablet Take 1 tablet (10 mg total) by mouth every 8 (eight) hours. Patient not taking: Reported on 06/01/2017 05/11/15   Joseph Art, DO  ketorolac (TORADOL) 10 MG tablet Take 1 tablet (10 mg total) by mouth every 6 (six) hours as needed. 06/01/17   Jacalyn Lefevre, MD  medroxyPROGESTERone (PROVERA) 10 MG tablet Take 1 tablet (10 mg total) by mouth daily. 06/01/17   Jacalyn Lefevre, MD    Family History Family History  Problem Relation Age of Onset  . Hypertension Mother   . Colon cancer Mother   . Hypertension Father   . Hypertension Brother     Social History Social History  Substance Use Topics  . Smoking status: Never Smoker  . Smokeless tobacco: Never Used  . Alcohol use No     Allergies   Patient has no known  allergies.   Review of Systems Review of Systems  Gastrointestinal: Positive for abdominal pain.  Genitourinary: Positive for pelvic pain and vaginal bleeding.  All other systems reviewed and are negative.    Physical Exam Updated Vital Signs BP (!) 142/93   Pulse 78   Temp 98 F (36.7 C) (Oral)   Resp 14   Ht  (1.626 m)   Wt 88 kg (194 lb)   SpO2 99%   BMI 33.30 kg/m   Physical Exam  Constitutional: She is oriented to person, place, and time. She appears well-developed and well-nourished.  HENT:  Head: Normocephalic and atraumatic.  Right Ear: External ear normal.  Left Ear: External ear normal.  Nose: Nose normal.  Mouth/Throat: Oropharynx is clear and moist.  Eyes: Pupils are equal, round, and reactive to light. Conjunctivae and EOM are normal.  Neck: Normal range of motion. Neck supple.  Cardiovascular: Normal rate, regular rhythm, normal heart sounds and intact distal pulses.   Pulmonary/Chest: Effort normal and breath sounds normal.  Abdominal: Soft. Bowel sounds are normal. There is tenderness in the right lower quadrant.  Genitourinary: Right adnexum displays tenderness. There is bleeding in the vagina.  Musculoskeletal: Normal range of motion.  Neurological: She is alert and oriented to person, place, and time.  Skin: Skin is warm.  Psychiatric: She has a normal mood and affect. Her behavior is normal. Judgment and thought content normal.  Nursing note and vitals reviewed.    ED Treatments / Results  Labs (all labs ordered are listed, but only abnormal results are displayed) Labs Reviewed  COMPREHENSIVE METABOLIC PANEL - Abnormal; Notable for the following:       Result Value   Glucose, Bld 116 (*)    All other components within normal limits  CBC - Abnormal; Notable for the following:    Hemoglobin 11.5 (*)    MCV 74.4 (*)    MCH 23.4 (*)    RDW 16.5 (*)    All other components within normal limits  URINALYSIS, ROUTINE W REFLEX MICROSCOPIC -  Abnormal; Notable for the following:    Hgb urine dipstick LARGE (*)    Protein, ur 30 (*)    Bacteria, UA RARE (*)    Squamous Epithelial / LPF 0-5 (*)    All other components within normal limits  WET PREP, GENITAL  LIPASE, BLOOD  I-STAT BETA HCG BLOOD, ED ([facility], [facility], [facility] ONLY)  GC/CHLAMYDIA PROBE AMP ([place]) NOT AT [facility]    EKG  EKG Interpretation None       Radiology US Transvaginal Non-ob  Result Date: 06/01/2017 CLINICAL DATA:  Right lower quadrant  pain. Dysmenorrhea. Vaginal bleeding for 9 days. EXAM: TRANSABDOMINAL AND TRANSVAGINAL ULTRASOUND OF PELVIS DOPPLER ULTRASOUND OF OVARIES TECHNIQUE: Both transabdominal and transvaginal ultrasound examinations of the pelvis were performed. Transabdominal technique was performed for global imaging of the pelvis including uterus, ovaries, adnexal regions, and pelvic cul-de-sac. It was necessary to proceed with endovaginal exam following the transabdominal exam to visualize the uterus, ovaries and adnexa. Color and duplex Doppler ultrasound was utilized to evaluate blood flow to the ovaries. COMPARISON:  Abdominal CT 02/17/2017 FINDINGS: Uterus Measurements: 7.7 x 4.8 x 5.4 cm. No fibroids or other mass visualized. Nabothian cysts in the cervix. Endometrium Thickness: 10 mm.  No focal abnormality visualized. Right ovary Not visualized.  No adnexal mass. Left ovary Measurements: 4.4 x 3.1 x 3.7 cm. Simple cyst measures 2.7 x 2.2 cm. Normal blood flow. Pulsed Doppler evaluation of the left ovary demonstrates normal low-resistance arterial and venous waveforms. Other findings No abnormal free fluid. IMPRESSION: 1. Simple cyst in the left ovary measuring 2.7 cm. Normal ovarian blood flow. In a premenopausal patient, no further follow-up is needed. If patient is postmenopausal, recommend yearly follow-up. 2. Right ovary not visualized.  No adnexal mass. 3. Endometrial thickness of 10 mm. This is not abnormally thickened in a premenopausal patient. If  bleeding remains unresponsive to hormonal or medical therapy, sonohysterogram should be considered for focal lesion work-up. If patient is postmenopausal, endometrial sampling is indicated to exclude carcinoma. If results are benign, sonohysterogram should be considered for focal lesion work-up. (Ref: Radiological Reasoning: Algorithmic Workup of Abnormal Vaginal Bleeding with Endovaginal Sonography and Sonohysterography. AJR 2008; 161:[id number]:S68-73) 4. Nabothian cysts in the cervix. Electronically Signed   By: Rubye OaksMelanie  Ehinger M.D.   On: 06/01/2017 22:07   [place] Pelvis Complete  Result Date: 06/01/2017 CLINICAL DATA:  Right lower quadrant pain. Dysmenorrhea. Vaginal bleeding for 9 days. EXAM: TRANSABDOMINAL AND TRANSVAGINAL ULTRASOUND OF PELVIS DOPPLER ULTRASOUND OF OVARIES TECHNIQUE: Both transabdominal and transvaginal ultrasound examinations of the pelvis were performed. Transabdominal technique was performed for global imaging of the pelvis including uterus, ovaries, adnexal regions, and pelvic cul-de-sac. It was necessary to proceed with endovaginal exam following the transabdominal exam to visualize the uterus, ovaries and adnexa. Color and duplex Doppler ultrasound was utilized to evaluate blood flow to the ovaries. COMPARISON:  Abdominal CT 02/17/2017 FINDINGS: Uterus Measurements: 7.7 x 4.8 x 5.4 cm. No fibroids or other mass visualized. Nabothian cysts in the cervix. Endometrium Thickness: 10 mm.  No focal abnormality visualized. Right ovary Not visualized.  No adnexal mass. Left ovary Measurements: 4.4 x 3.1 x 3.7 cm. Simple cyst measures 2.7 x 2.2 cm. Normal blood flow. Pulsed Doppler evaluation of the left ovary demonstrates normal low-resistance arterial and venous waveforms. Other findings No abnormal free fluid. IMPRESSION: 1. Simple cyst in the left ovary measuring 2.7 cm. Normal ovarian blood flow. In a premenopausal patient, no further follow-up is needed. If patient is postmenopausal, recommend yearly  follow-up. 2. Right ovary not visualized.  No adnexal mass. 3. Endometrial thickness of 10 mm. This is not abnormally thickened in a premenopausal patient. If bleeding remains unresponsive to hormonal or medical therapy, sonohysterogram should be considered for focal lesion work-up. If patient is postmenopausal, endometrial sampling is indicated to exclude carcinoma. If results are benign, sonohysterogram should be considered for focal lesion work-up. (Ref: Radiological Reasoning: Algorithmic Workup of Abnormal Vaginal Bleeding with Endovaginal Sonography and Sonohysterography. AJR 2008; 540:[id number]:S68-73) 4. Nabothian cysts in the cervix. Electronically Signed   By: Lujean RaveMelanie  Ehinger M.D.  On: 06/01/2017 22:07   [place] Art/ven Flow Abd Pelv Doppler  Result Date: 06/01/2017 CLINICAL DATA:  Right lower quadrant pain. Dysmenorrhea. Vaginal bleeding for 9 days. EXAM: TRANSABDOMINAL AND TRANSVAGINAL ULTRASOUND OF PELVIS DOPPLER ULTRASOUND OF OVARIES TECHNIQUE: Both transabdominal and transvaginal ultrasound examinations of the pelvis were performed. Transabdominal technique was performed for global imaging of the pelvis including uterus, ovaries, adnexal regions, and pelvic cul-de-sac. It was necessary to proceed with endovaginal exam following the transabdominal exam to visualize the uterus, ovaries and adnexa. Color and duplex Doppler ultrasound was utilized to evaluate blood flow to the ovaries. COMPARISON:  Abdominal CT 02/17/2017 FINDINGS: Uterus Measurements: 7.7 x 4.8 x 5.4 cm. No fibroids or other mass visualized. Nabothian cysts in the cervix. Endometrium Thickness: 10 mm.  No focal abnormality visualized. Right ovary Not visualized.  No adnexal mass. Left ovary Measurements: 4.4 x 3.1 x 3.7 cm. Simple cyst measures 2.7 x 2.2 cm. Normal blood flow. Pulsed Doppler evaluation of the left ovary demonstrates normal low-resistance arterial and venous waveforms. Other findings No abnormal free fluid. IMPRESSION: 1. Simple  cyst in the left ovary measuring 2.7 cm. Normal ovarian blood flow. In a premenopausal patient, no further follow-up is needed. If patient is postmenopausal, recommend yearly follow-up. 2. Right ovary not visualized.  No adnexal mass. 3. Endometrial thickness of 10 mm. This is not abnormally thickened in a premenopausal patient. If bleeding remains unresponsive to hormonal or medical therapy, sonohysterogram should be considered for focal lesion work-up. If patient is postmenopausal, endometrial sampling is indicated to exclude carcinoma. If results are benign, sonohysterogram should be considered for focal lesion work-up. (Ref: Radiological Reasoning: Algorithmic Workup of Abnormal Vaginal Bleeding with Endovaginal Sonography and Sonohysterography. AJR 2008; 960:A54-09) 4. Nabothian cysts in the cervix. Electronically Signed   By: Rubye Oaks M.D.   On: 06/01/2017 22:07    Procedures Procedures (including critical care time)  Medications Ordered in ED Medications  medroxyPROGESTERone (PROVERA) tablet 10 mg (not administered)  sodium chloride 0.9 % bolus 1,000 mL (1,000 mLs Intravenous New Bag/Given 06/01/17 1924)  ketorolac (TORADOL) 30 MG/ML injection 30 mg (30 mg Intravenous Given 06/01/17 1933)     Initial Impression / Assessment and Plan / ED Course  I have reviewed the triage vital signs and the nursing notes.  Pertinent labs & imaging results that were available during my care of the patient were reviewed by me and considered in my medical decision making (see chart for details).    Pt is feeling better.  She knows to f/u with her obgyn.  We will start her on provera to try to stop the bleeding.  She knows to return if worse.  Final Clinical Impressions(s) / ED Diagnoses   Final diagnoses:  Perimenopausal menorrhagia    New Prescriptions New Prescriptions   KETOROLAC (TORADOL) 10 MG TABLET    Take 1 tablet (10 mg total) by mouth every 6 (six) hours as needed.    MEDROXYPROGESTERONE (PROVERA) 10 MG TABLET    Take 1 tablet (10 mg total) by mouth daily.     Jacalyn Lefevre, MD 06/01/17 2219

## 2017-06-01 NOTE — ED Notes (Signed)
Pt still in US at this time.

## 2017-06-01 NOTE — ED Notes (Signed)
Pt transported to US

## 2017-06-01 NOTE — ED Triage Notes (Signed)
Per Pt, Pt is coming from home with complaints of lower abdominal pain that started three days ago. Pt had menstrual cycle as normal until two months ago when it skipped, and then restarted nine days ago. Pt reports period continuing and using approximately two pads in one hour.

## 2017-06-01 NOTE — ED Notes (Signed)
Pt reports moderate amount of vaginal bleeding with lower abd cramping x 9 days. Pt reports it feels like her period but has been going on for 9 days. Pt LMP 2 months ago. Denies abnormal vaginal discharge or itching. Denies N/V/D.

## 2017-06-02 LAB — GC/CHLAMYDIA PROBE AMP (~~LOC~~) NOT AT ARMC
Chlamydia: NEGATIVE
NEISSERIA GONORRHEA: NEGATIVE

## 2017-06-27 ENCOUNTER — Ambulatory Visit (INDEPENDENT_AMBULATORY_CARE_PROVIDER_SITE_OTHER): Payer: BLUE CROSS/BLUE SHIELD | Admitting: Emergency Medicine

## 2017-06-27 ENCOUNTER — Encounter: Payer: Self-pay | Admitting: Emergency Medicine

## 2017-06-27 DIAGNOSIS — D869 Sarcoidosis, unspecified: Secondary | ICD-10-CM | POA: Diagnosis not present

## 2017-06-27 NOTE — Progress Notes (Signed)
Subjective:    Patient ID: Hannah Ellis, female    DOB: 10-29-1965, 51 y.o.   MRN: [id number]  HPI 51 year old woman with sarcoidosis diagnosed by bronchoscopy in 06/2016. She has pulmonary involvement also a history of a nodular rash.  She was tapered off steroids at last visit based on improvement in her CT scan from 12/05/16. I referred her to dermatology to evaluate the rash, attempt to correlate it with her sarcoidosis if possible - they did think it was related, treated with a steroid cream. Breathing well, stays active. Exercises 3 days a week. She sees opthalmology.     Review of Systems  Constitutional: Negative.  Negative for fever and unexpected weight change.  HENT: Negative.  Negative for congestion, dental problem, ear pain, nosebleeds, postnasal drip, rhinorrhea, sinus pressure, sneezing, sore throat and trouble swallowing.   Eyes: Negative.  Negative for redness and itching.  Respiratory: Negative for cough, chest tightness, shortness of breath and wheezing.   Cardiovascular: Negative.  Negative for palpitations and leg swelling.  Gastrointestinal: Negative.  Negative for nausea and vomiting.  Genitourinary: Negative.  Negative for dysuria.  Musculoskeletal: Negative.  Negative for joint swelling.  Skin: Positive for rash.  Neurological: Negative.  Negative for headaches.  Hematological: Negative.  Does not bruise/bleed easily.  Psychiatric/Behavioral: Negative.  Negative for dysphoric mood. The patient is not nervous/anxious.     Past Medical History:  Diagnosis Date  . Arthritis    "knees" (02/17/2017)  . 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 Depression   . GERD (gastroesophageal reflux disease)   . Hypertension   . Pneumonia ~ 2017  . Sarcoidosis, lung (HCC)   . Stroke (HCC) 05/09/2015   "vs TIA; mild"   . TIA (transient ischemic attack) 05/09/2015   "vs CVA; denies residual on 02/17/2017)     Family History  Problem Relation Age of Onset  . Hypertension  Mother   . Colon cancer Mother   . Hypertension Father   . Hypertension Brother      Social History   Social History  . Marital status: Married    Spouse name: N/A  . Number of children: N/A  . Years of education: N/A   Occupational History  . Not on file.   Social History Main Topics  . Smoking status: Never Smoker  . Smokeless tobacco: Never Used  . Alcohol use No  . Drug use: No  . Sexual activity: Yes   Other Topics Concern  . Not on file   Social History Narrative  . No narrative on file  lives in [place], originally from [place].  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   No Known Allergies   Outpatient Medications Prior to Visit  Medication Sig Dispense Refill  . amLODipine (NORVASC) 10 MG tablet Take 1 tablet (10 mg total) by mouth daily. 30 tablet 0  . aspirin EC 325 MG EC tablet Take 1 tablet (325 mg total) by mouth daily. 30 tablet 0  . atorvastatin (LIPITOR) 10 MG tablet Take 1 tablet (10 mg total) by mouth daily at 6 PM. 30 tablet 0  . hydrALAZINE (APRESOLINE) 10 MG tablet Take 1 tablet (10 mg total) by mouth every 8 (eight) hours. 90 tablet 0  . hydrALAZINE (APRESOLINE) 25 MG tablet Take 25 mg by mouth 2 (two) times daily.  0  . hydrochlorothiazide (HYDRODIURIL) 25 MG tablet Take 1 tablet (25 mg total) by mouth daily. 30 tablet 0  .  lisinopril (PRINIVIL,ZESTRIL) 40 MG tablet Take 1 tablet (40 mg total) by mouth daily. 30 tablet 0  . medroxyPROGESTERone (PROVERA) 10 MG tablet Take 1 tablet (10 mg total) by mouth daily. 7 tablet 0  . metoprolol tartrate (LOPREMarland KitchenSSOR) 25 MG tablet Take 25 mg by mouth 2 (two) times daily.    . omeprazole (PRILOSEC) 40 MG capsule Take 40 mg by mouth daily as needed (heartburn).   0  . Pediatric Multiple Vit-C-FA (FLINSTONES GUMMIES OMEGA-3 DHA) CHEW Chew 1 tablet by mouth daily.     Marland Kitchen triamcinolone cream (KENALOG) 0.1 % Apply 1 application topically daily. Dry skin  0  . benzonatate (TESSALON) 100 MG capsule Take 1  capsule (100 mg total) by mouth 3 (three) times daily as needed for cough. 90 capsule 0  . ketorolac (TORADOL) 10 MG tablet Take 1 tablet (10 mg total) by mouth every 6 (six) hours as needed. 10 tablet 0   No facility-administered medications prior to visit.         Objective:   Physical Exam Vitals:   06/27/17 1532  BP: 118/80  Pulse: 92  SpO2: 97%  Weight: 193 lb (87.5 kg)  Height:  (1.626 m)   Gen: Pleasant, well-nourished, in no distress,  normal affect  ENT: No lesions,  mouth clear,  oropharynx clear, no postnasal drip  Neck: No JVD, no TMG, no carotid bruits  Lungs: No use of accessory muscles, clear without rales or rhonchi  Cardiovascular: RRR, heart sounds normal, no murmur or gallops, no peripheral edema  Musculoskeletal: No deformities, no cyanosis or clubbing  Neuro: alert, non focal  Skin: Very pale raised, appears to itch, on her upper arms and posterior neck     Assessment & Plan:  Sarcoidosis (HCC) Rashes well controlled at this time. She uses a steroid cream when it does flare. Breathing stable.  Please continue to use your steroid cream if your rash flares We will repeat your CT scan of the chest in March 2019. Flu shot up-to-date Remember to follow with ophthalmology once a year Follow with Dr Delton Coombes in March 2019 after your CT scan to review the results, sooner if you have any problems  Levy Pupa, MD, PhD 06/27/2017, 3:43 PM [facility] [phone] or if no answer [phone]

## 2017-06-27 NOTE — Assessment & Plan Note (Signed)
Rashes well controlled at this time. She uses a steroid cream when it does flare. Breathing stable.  Please continue to use your steroid cream if your rash flares We will repeat your CT scan of the chest in March 2019. Flu shot up-to-date Remember to follow with ophthalmology once a year Follow with Dr Delton Coombes in March 2019 after your CT scan to review the results, sooner if you have any problems

## 2017-06-27 NOTE — Patient Instructions (Signed)
Please continue to use your steroid cream if your rash flares We will repeat your CT scan of the chest in March 2019. Flu shot up-to-date Remember to follow with ophthalmology once a year Follow with Dr Delton Coombes in March 2019 after your CT scan to review the results, sooner if you have any problems

## 2017-07-28 ENCOUNTER — Telehealth: Payer: Self-pay | Admitting: Emergency Medicine

## 2017-07-28 NOTE — Telephone Encounter (Signed)
Ok to refill and use tessalon q6h prn cough. If the cough continues or if she develops other changes, dyspnea then would consider OV, more workup to insure that this does not reflect flare of her sarcoid.

## 2017-07-28 NOTE — Telephone Encounter (Signed)
Pt returned call.-tr °

## 2017-07-28 NOTE — Telephone Encounter (Signed)
Spoke with pt, who reports of prod cough with white mucus & chest discomfort x4-5d Denies fever, chills or sweats. Pt states she had previously been prescribed Tessalon pearls with mild improvement.  Pt is requesting Rx for Tessalon.   RB please advise. Thanks.

## 2017-07-28 NOTE — Telephone Encounter (Signed)
lmtcb x1 for pt. 

## 2017-07-28 NOTE — Telephone Encounter (Signed)
Left message for patient to call back  

## 2017-07-29 MED ORDER — BENZONATATE 100 MG PO CAPS: 100.0000 mg | ORAL_CAPSULE | Freq: Four times a day (QID) | ORAL | 1 refills | Status: DC | PRN

## 2017-07-29 NOTE — Telephone Encounter (Signed)
Called and spoke with pt and she is aware of RB recs.  She is aware of refill of the tessalon and nothing further is needed.

## 2017-08-21 ENCOUNTER — Other Ambulatory Visit: Payer: Self-pay

## 2017-08-21 ENCOUNTER — Telehealth: Payer: Self-pay | Admitting: Emergency Medicine

## 2017-08-21 ENCOUNTER — Emergency Department (HOSPITAL_COMMUNITY): Payer: BLUE CROSS/BLUE SHIELD

## 2017-08-21 ENCOUNTER — Emergency Department (HOSPITAL_COMMUNITY)
Admission: EM | Admit: 2017-08-21 | Discharge: 2017-08-21 | Disposition: A | Discharge status: Home / Self Care | Payer: BLUE CROSS/BLUE SHIELD | Attending: Emergency Medicine | Admitting: Emergency Medicine

## 2017-08-21 DIAGNOSIS — R05 Cough: Secondary | ICD-10-CM | POA: Diagnosis present

## 2017-08-21 DIAGNOSIS — R053 Chronic cough: Secondary | ICD-10-CM

## 2017-08-21 DIAGNOSIS — R0789 Other chest pain: Secondary | ICD-10-CM | POA: Diagnosis not present

## 2017-08-21 DIAGNOSIS — I10 Essential (primary) hypertension: Secondary | ICD-10-CM | POA: Diagnosis not present

## 2017-08-21 DIAGNOSIS — Z7982 Long term (current) use of aspirin: Secondary | ICD-10-CM | POA: Diagnosis not present

## 2017-08-21 DIAGNOSIS — Z79899 Other long term (current) drug therapy: Secondary | ICD-10-CM | POA: Insufficient documentation

## 2017-08-21 LAB — D-DIMER, QUANTITATIVE: D-Dimer, Quant: 0.37 ug/mL-FEU (ref 0.00–0.50)

## 2017-08-21 MED ORDER — GUAIFENESIN-CODEINE 100-10 MG/5ML PO SYRP: 5.0000 mL | ORAL_SOLUTION | Freq: Three times a day (TID) | ORAL | 0 refills | Status: DC | PRN

## 2017-08-21 NOTE — ED Notes (Signed)
Pt taken to Xray.

## 2017-08-21 NOTE — ED Notes (Signed)
Declined W/C at D/C and was escorted to lobby by RN. 

## 2017-08-21 NOTE — ED Triage Notes (Signed)
patient complains of cough x 2 months. Now having Chest wall pain with cough and inspiration. Alert and oriented, NAD

## 2017-08-21 NOTE — ED Notes (Signed)
Pt given half a cup of water, per Magda PaganiniAudrey, Charity fundraiserN.

## 2017-08-21 NOTE — Discharge Instructions (Signed)
There were no acute abnormalities on the chest x-ray.  The d-dimer was negative.  Please use the cough syrup as needed and as prescribed.  Do not drive or perform other dangerous activities while taking this cough syrup.  May need to sleep more upright with extra pillows or in a chair to help alleviate the cough.  Follow-up with your pulmonologist on this matter as soon as possible.

## 2017-08-21 NOTE — ED Provider Notes (Signed)
[facility] Provider Note   CSN: [id number] Arrival date & time: 08/21/17  1223     History   Chief Complaint Chief Complaint  Patient presents with  . cough/Chest wall pain    HPI Hannah Ellis is a 51 y.o. female.  HPI   Hannah Ellis is a 51 y.o. female, with a history of sarcoidosis, HTN, TIA, and GERD, presenting to the ED with persistent cough for the last two months. Constant over this time. Worse at night. Chest discomfort with coughing, central chest, sharp, 5/10, nonradiating.  Last saw her pulmonologist 06/27/17 and had no symptoms at that time.  Nonsmoker. No history of PE/DVT, recent immobilization, recent surgery, recent trauma, or hormone use. Denies fever/chills, peripheral edema, nausea/vomiting, shortness of breath, or any other complaints.    Past Medical History:  Diagnosis Date  . Arthritis    "knees" (02/17/2017)  . 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 Depression   . GERD (gastroesophageal reflux disease)   . Hypertension   . Pneumonia ~ 2017  . Sarcoidosis, lung (HCC)   . Stroke (HCC) 05/09/2015   "vs TIA; mild"   . TIA (transient ischemic attack) 05/09/2015   "vs CVA; denies residual on 02/17/2017)    Patient Active Problem List   Diagnosis Date Noted  . Hypokalemia   . Abdominal pain 02/17/2017  . Pyelonephritis   . 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 Sarcoidosis 10/15/2016  . Encephalopathy, hypertensive 07/11/2015  . Anxiety state 07/11/2015  . Essential hypertension 07/11/2015  . TIA (transient ischemic attack) 05/09/2015  . Uncontrolled hypertension 05/09/2015  . Patient nonadherence 05/09/2015  . Microcytic anemia 05/09/2015    Past Surgical History:  Procedure Laterality Date  . APPENDECTOMY    . CESAREAN SECTION  1998  . EYE SURGERY Left ~ 1972  . FOOT SURGERY Right    "took some bone out of pinky and one next to it"  . TUBAL LIGATION  1998    OB History    No data available       Home Medications    Prior to Admission medications   Medication Sig Start Date End Date Taking? Authorizing Provider  amLODipine (NORVASC) 10 MG tablet Take 1 tablet (10 mg total) by mouth daily. 04/05/16   Tomasita Crumbleni, Adeleke, MD  aspirin EC 325 MG EC tablet Take 1 tablet (325 mg total) by mouth daily. 05/11/15   Joseph ArtVann, Jessica U, DO  atorvastatin (LIPITOR) 10 MG tablet Take 1 tablet (10 mg total) by mouth daily at 6 PM. 05/11/15   Joseph ArtVann, Jessica U, DO  benzonatate (TESSALON) 100 MG capsule Take 1 capsule (100 mg total) every 6 (six) hours as needed by mouth for cough. 07/29/17   Leslye PeerByrum, Robert S, MD  guaiFENesin-codeine (ROBITUSSIN AC) 100-10 MG/5ML syrup Take 5 mLs by mouth 3 (three) times daily as needed for cough. 08/21/17   Joy, Shawn C, PA-C  hydrALAZINE (APRESOLINE) 10 MG tablet Take 1 tablet (10 mg total) by mouth every 8 (eight) hours. 05/11/15   Joseph ArtVann, Jessica U, DO  hydrALAZINE (APRESOLINE) 25 MG tablet Take 25 mg by mouth 2 (two) times daily. 05/22/17   [provider]  hydrochlorothiazide (HYDRODIURIL) 25 MG tablet Take 1 tablet (25 mg total) by mouth daily. 05/11/15   Joseph ArtVann, Jessica U, DO  lisinopril (PRINIVIL,ZESTRIL) 40 MG tablet Take 1 tablet (40 mg total) by mouth daily. 05/11/15   Joseph ArtVann, Jessica U, DO  medroxyPROGESTERone (PROVERA) 10 MG tablet  Take 1 tablet (10 mg total) by mouth daily. 06/01/17   Jacalyn Lefevre, MD  metoprolol tartrate (LOPRESSOR) 25 MG tablet Take 25 mg by mouth 2 (two) times daily.    [provider]  omeprazole (PRILOSEC) 40 MG capsule Take 40 mg by mouth daily as needed (heartburn).  10/04/16   [provider]  Pediatric Multiple Vit-C-FA (FLINSTONES GUMMIES OMEGA-3 DHA) CHEW Chew 1 tablet by mouth daily.     [provider]  triamcinolone cream (KENALOG) 0.1 % Apply 1 application topically daily. Dry skin 05/12/17   [provider]    Family History Family History  Problem Relation Age of Onset  . Hypertension Mother   .  Colon cancer Mother   . Hypertension Father   . Hypertension Brother     Social History Social History   Tobacco Use  . Smoking status: Never Smoker  . Smokeless tobacco: Never Used  Substance Use Topics  . Alcohol use: No  . Drug use: No     Allergies   Patient has no known allergies.   Review of Systems Review of Systems  Constitutional: Negative for chills, diaphoresis and fever.  Respiratory: Negative for shortness of breath.   Gastrointestinal: Negative for abdominal pain, nausea and vomiting.  Musculoskeletal:       Chest soreness with coughing  Neurological: Negative for weakness and numbness.  All other systems reviewed and are negative.    Physical Exam Updated Vital Signs BP 116/75 (BP Location: Right Arm)   Pulse 90   Temp 98.1 F (36.7 C) (Oral)   Resp 16   SpO2 99%   Physical Exam  Constitutional: She appears well-developed and well-nourished. No distress.  HENT:  Head: Normocephalic and atraumatic.  Eyes: Conjunctivae are normal.  Neck: Neck supple.  Cardiovascular: Normal rate, regular rhythm, normal heart sounds and intact distal pulses.  Pulmonary/Chest: Effort normal and breath sounds normal. No respiratory distress. She exhibits tenderness.  Patient shows no increased work of breathing.  She does cough during the interview.  Maintains excellent SPO2 while coughing or speaking. Tenderness in the area indicated, however, no noted swelling, erythema, ecchymosis, crepitus, or other abnormality.    Abdominal: Soft. There is no tenderness. There is no guarding.  Musculoskeletal: She exhibits no edema.  Lymphadenopathy:    She has no cervical adenopathy.  Neurological: She is alert.  Skin: Skin is warm and dry. Capillary refill takes less than 2 seconds. She is not diaphoretic.  Psychiatric: She has a normal mood and affect. Her behavior is normal.  Nursing note and vitals reviewed.    ED Treatments / Results  Labs (all labs ordered are  listed, but only abnormal results are displayed) Labs Reviewed  D-DIMER, QUANTITATIVE (NOT AT [facility])    EKG  EKG Interpretation None       Radiology Dg Chest 2 View  Result Date: 08/21/2017 CLINICAL DATA:  Cough.  Sarcoidosis. EXAM: CHEST  2 VIEW COMPARISON:  04/05/2016 chest radiograph. FINDINGS: Stable cardiomediastinal silhouette with normal heart size. No pneumothorax. No pleural effusion. No pulmonary edema. Mild reticulonodular opacity in the right mid lung, significantly decreased since 04/05/2016 chest radiographs. No acute consolidative airspace disease. IMPRESSION: Mild reticulonodular opacity in the right mid lung, significantly decreased since the most recent comparison chest CT of 04/05/2016, probably reflecting residual pulmonary parenchymal findings of sarcoidosis. Otherwise no active disease in the chest. Electronically Signed   By: Delbert Phenix M.D.   On: 08/21/2017 13:40    Procedures Procedures (  including critical care time)  Medications Ordered in ED Medications - No data to display   Initial Impression / Assessment and Plan / ED Course  I have reviewed the triage vital signs and the nursing notes.  Pertinent labs & imaging results that were available during my care of the patient were reviewed by me and considered in my medical decision making (see chart for details).      Patient presents with persistent cough for the last 2 months.  Tenderness to the chest wall and pain with coughing.  Patient is nontoxic appearing, afebrile, not tachycardic, not tachypneic, not hypotensive, maintains excellent SPO2 on room air, and is in no apparent distress. No acute abnormality on chest x-ray.  D-dimer negative.  Pulmonologist follow-up. We will try a different cough suppressant.  Return precautions discussed.  Patient voices understanding of all instructions and is comfortable with discharge.  Findings and plan of care discussed with Adalberto Coleob Lockwood, MD.    Vitals:    08/21/17 1230 08/21/17 1247 08/21/17 1300  BP: 138/75 116/75 (!) 108/58  Pulse: 88 90 78  Resp: 16 16   Temp: 98.1 F (36.7 C)    TempSrc: Oral    SpO2: 98% 99% 97%     Final Clinical Impressions(s) / ED Diagnoses   Final diagnoses:  Persistent cough for 3 weeks or longer    ED Discharge Orders        Ordered    guaiFENesin-codeine [facility](ROBITUSSIN AC) 100-10 MG/5ML syrup  3 times daily PRN     08/21/17 1538       Anselm PancoastJoy, Shawn C, PA-C 08/21/17 1540    Gerhard MunchLockwood, Robert, MD 08/21/17 1552

## 2017-08-26 ENCOUNTER — Encounter: Payer: Self-pay | Admitting: Emergency Medicine

## 2017-08-26 ENCOUNTER — Ambulatory Visit: Payer: BLUE CROSS/BLUE SHIELD | Admitting: Emergency Medicine

## 2017-08-26 VITALS — BP 122/76 | HR 82 | Ht 64.0 in | Wt 199.0 lb

## 2017-08-26 DIAGNOSIS — R05 Cough: Secondary | ICD-10-CM | POA: Diagnosis not present

## 2017-08-26 DIAGNOSIS — R053 Chronic cough: Secondary | ICD-10-CM | POA: Insufficient documentation

## 2017-08-26 DIAGNOSIS — D869 Sarcoidosis, unspecified: Secondary | ICD-10-CM

## 2017-08-26 DIAGNOSIS — R051 Acute cough: Secondary | ICD-10-CM | POA: Insufficient documentation

## 2017-08-26 MED ORDER — VALSARTAN 160 MG PO TABS: 160.0000 mg | ORAL_TABLET | Freq: Every day | ORAL | 5 refills | Status: DC

## 2017-08-26 NOTE — Assessment & Plan Note (Signed)
He has some increased rash and is using topical steroid cream.  As above her cough could be related to active pulmonary disease.  Evaluation underway as detailed.

## 2017-08-26 NOTE — Patient Instructions (Signed)
Start taking your omeprazole 40 mg daily every day for the next 3 weeks.  Remember to take this 30-60 minutes before or after food. We will perform pulmonary function testing.  Depending on the results we will decide whether we need to do further imaging like a CT scan, or treat for a flare of sarcoidosis. Follow with Dr Delton CoombesByrum next available.

## 2017-08-26 NOTE — Progress Notes (Signed)
Subjective:    Patient ID: Hannah Ellis, female    DOB: Bary CastillaJuly 21, 1967, 51 y.o.   MRN: [id number]  HPI 51 year old woman with sarcoidosis diagnosed by bronchoscopy in 06/2016. She has pulmonary involvement also a history of a nodular rash.  She was tapered off steroids at last visit based on improvement in her CT scan from 12/05/16. I referred her to dermatology to evaluate the rash, attempt to correlate it with her sarcoidosis if possible - they did think it was related, treated with a steroid cream. Breathing well, stays active. Exercises 3 days a week. She sees opthalmology.    ROV 08/26/17 --patient has a history of sarcoidosis with pulmonary and skin involvement. She began to develop a cough about 2 months ago after our initial visit. Minimally productive, some clear mucous at that time. Can wake her from sleep. She was seen in the ED 11/29 and a CXR showed persistent but improved R mid lung opacity. No significant nasal gtt, no change in reflux sx - happens rarely. Takes PPI prn. Her rash comes and goes, had to use her steroids cream beginning yesterday.    Review of Systems  Constitutional: Negative.  Negative for fever and unexpected weight change.  HENT: Negative.  Negative for congestion, dental problem, ear pain, nosebleeds, postnasal drip, rhinorrhea, sinus pressure, sneezing, sore throat and trouble swallowing.   Eyes: Negative.  Negative for redness and itching.  Respiratory: Negative for cough, chest tightness, shortness of breath and wheezing.   Cardiovascular: Negative.  Negative for palpitations and leg swelling.  Gastrointestinal: Negative.  Negative for nausea and vomiting.  Genitourinary: Negative.  Negative for dysuria.  Musculoskeletal: Negative.  Negative for joint swelling.  Skin: Positive for rash.  Neurological: Negative.  Negative for headaches.  Hematological: Negative.  Does not bruise/bleed easily.  Psychiatric/Behavioral: Negative.  Negative for dysphoric mood.  The patient is not nervous/anxious.     Past Medical History:  Diagnosis Date  . Arthritis    "knees" (02/17/2017)  . 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 Depression   . GERD (gastroesophageal reflux disease)   . Hypertension   . Pneumonia ~ 2017  . Sarcoidosis, lung (HCC)   . Stroke (HCC) 05/09/2015   "vs TIA; mild"   . TIA (transient ischemic attack) 05/09/2015   "vs CVA; denies residual on 02/17/2017)     Family History  Problem Relation Age of Onset  . Hypertension Mother   . Colon cancer Mother   . Hypertension Father   . Hypertension Brother      Social History   Socioeconomic History  . Marital status: Married    Spouse name: Not on file  . Number of children: Not on file  . Years of education: Not on file  . Highest education level: Not on file  Social Needs  . Financial resource strain: Not on file  . Food insecurity - worry: Not on file  . Food insecurity - inability: Not on file  . Transportation needs - medical: Not on file  . Transportation needs - non-medical: Not on file  Occupational History  . Not on file  Tobacco Use  . Smoking status: Never Smoker  . Smokeless tobacco: Never Used  Substance and Sexual Activity  . Alcohol use: No  . Drug use: No  . Sexual activity: Yes  Other Topics Concern  . Not on file  Social History Narrative  . Not on file  [place]  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   No Known Allergies   Outpatient Medications Prior to Visit  Medication Sig Dispense Refill  . amLODipine (NORVASC) 10 MG tablet Take 1 tablet (10 mg total) by mouth daily. 30 tablet 0  . aspirin EC 325 MG EC tablet Take 1 tablet (325 mg total) by mouth daily. 30 tablet 0  . atorvastatin (LIPITOR) 10 MG tablet Take 1 tablet (10 mg total) by mouth daily at 6 PM. 30 tablet 0  . benzonatate (TESSALON) 100 MG capsule Take 1 capsule (100 mg total) every 6 (six) hours as needed by mouth for cough. 30 capsule 1    . guaiFENesin-codeine (ROBITUSSIN AC) 100-10 MG/5ML syrup Take 5 mLs by mouth 3 (three) times daily as needed for cough. 120 mL 0  . hydrALAZINE (APRESOLINE) 10 MG tablet Take 1 tablet (10 mg total) by mouth every 8 (eight) hours. 90 tablet 0  . hydrALAZINE (APRESOLINE) 25 MG tablet Take 25 mg by mouth 2 (two) times daily.  0  . hydrochlorothiazide (HYDRODIURIL) 25 MG tablet Take 1 tablet (25 mg total) by mouth daily. 30 tablet 0  . lisinopril (PRINIVIL,ZESTRIL) 40 MG tablet Take 1 tablet (40 mg total) by mouth daily. 30 tablet 0  . medroxyPROGESTERone (PROVERA) 10 MG tablet Take 1 tablet (10 mg total) by mouth daily. 7 tablet 0  . metoprolol tartrate (LOPRESSOR) 25 MG tablet Take 25 mg by mouth 2 (two) times daily.    Marland Kitchen. omeprazole (PRILOSEC) 40 MG capsule Take 40 mg by mouth daily as needed (heartburn).   0  . Pediatric Multiple Vit-C-FA (FLINSTONES GUMMIES OMEGA-3 DHA) CHEW Chew 1 tablet by mouth daily.     Marland Kitchen. triamcinolone cream (KENALOG) 0.1 % Apply 1 application topically daily. Dry skin  0   No facility-administered medications prior to visit.         Objective:   Physical Exam Vitals:   08/26/17 0956  BP: 122/76  Pulse: 82  SpO2: 98%  Weight: 199 lb (90.3 kg)  Height: 5\' 4"  (1.626 m)   Gen: Pleasant, well-nourished, in no distress,  normal affect  ENT: No lesions,  mouth clear,  oropharynx clear, no postnasal drip  Neck: No JVD, no TMG, no carotid bruits  Lungs: No use of accessory muscles, clear without rales or rhonchi  Cardiovascular: RRR, heart sounds normal, no murmur or gallops, no peripheral edema  Musculoskeletal: No deformities, no cyanosis or clubbing  Neuro: alert, non focal  Skin: Very pale raised, appears to itch, on her upper arms and posterior neck     Assessment & Plan:  Chronic cough The cough is been present for approximately 2 months.  She does have sarcoidosis and certainly active disease or obstructive lung disease need to be considered.  Her  chest x-ray 08/21/17 was reassuring, no progression of her known right midlung opacity.  Consider occult reflux as she does have a history of GERD.  Denies any significant rhinitis.  She needs pulmonary function testing to assess for progression of obstructive lung disease.  I will empirically increase her GERD therapy.  Depending on response and results of her PFT we will decide whether a need to repeat her CT chest.  Sarcoidosis (HCC) He has some increased rash and is using topical steroid cream.  As above her cough could be related to active pulmonary disease.  Evaluation underway as detailed.  Levy Pupaobert Manuella Blackson, MD, PhD 08/26/2017, 11:44 AM [facility] [phone] or if  no answer [phone]

## 2017-08-26 NOTE — Assessment & Plan Note (Signed)
The cough is been present for approximately 2 months.  She does have sarcoidosis and certainly active disease or obstructive lung disease need to be considered.  Her chest x-ray 08/21/17 was reassuring, no progression of her known right midlung opacity.  Consider occult reflux as she does have a history of GERD.  Denies any significant rhinitis.  She needs pulmonary function testing to assess for progression of obstructive lung disease.  I will empirically increase her GERD therapy.  Depending on response and results of her PFT we will decide whether a need to repeat her CT chest.

## 2017-08-26 NOTE — Telephone Encounter (Signed)
Pt seen today 12.4.18 Will sign off

## 2017-08-28 ENCOUNTER — Telehealth: Payer: Self-pay | Admitting: Emergency Medicine

## 2017-08-28 DIAGNOSIS — D869 Sarcoidosis, unspecified: Secondary | ICD-10-CM

## 2017-08-28 MED ORDER — LEVOFLOXACIN 500 MG PO TABS: 500.0000 mg | ORAL_TABLET | Freq: Every day | ORAL | 0 refills | Status: DC

## 2017-08-28 NOTE — Telephone Encounter (Signed)
Called spoke with patient who reported that she does not feel any worse since seeing RB on 12.4.18, however she did have 2 episodes of hemoptysis today >> BRB streaked with clear mucus.  She also notes feeling warm and having chills last night.  Pt wondering if she needs to come in for another office visit or if something can be phoned in for her.  Per 12.4.18 office visit: Patient Instructions  Start taking your omeprazole 40 mg daily every day for the next 3 weeks.  Remember to take this 30-60 minutes before or after food. We will perform pulmonary function testing.  Depending on the results we will decide whether we need to do further imaging like a CT scan, or treat for a flare of sarcoidosis. Follow with Dr Delton CoombesByrum next available.    Dr Delton CoombesByrum please advise, thank you.

## 2017-08-28 NOTE — Telephone Encounter (Addendum)
Called spoke with patient, discussed RB's recommendations Pt okay with these recs and voiced her understanding CT order placed STAT to ensure that it's scheduled prior to her 1.8.19 appt with RB Levaquin sent to verified pharmacy Pt aware to call the office if her symptoms are not improving or they worsen  Nothing further needed at this time; will sign off

## 2017-08-28 NOTE — Telephone Encounter (Signed)
Based on these symptoms I would like for her to go ahead and have her CT scan of the chest with contrast to follow sarcoidosis and a right lung infiltrate - please order this.  I would also like for her to start Levaquin 500 mg once a day for the next 7 days.  Please have her call us to let us know if her symptoms are not improving.  I will reviewed the CT scan and the pulmonary function testing with her at our follow-up visit.

## 2017-08-29 ENCOUNTER — Ambulatory Visit (INDEPENDENT_AMBULATORY_CARE_PROVIDER_SITE_OTHER)
Admission: RE | Admit: 2017-08-29 | Discharge: 2017-08-29 | Disposition: A | Discharge status: Home / Self Care | Payer: BLUE CROSS/BLUE SHIELD | Source: Ambulatory Visit | Attending: Emergency Medicine | Admitting: Emergency Medicine

## 2017-08-29 DIAGNOSIS — D869 Sarcoidosis, unspecified: Secondary | ICD-10-CM | POA: Diagnosis not present

## 2017-08-29 MED ORDER — IOPAMIDOL (ISOVUE-300) INJECTION 61%
80.0000 mL | Freq: Once | INTRAVENOUS | Status: AC | PRN
  Administered 2017-08-29: 80 mL via INTRAVENOUS

## 2017-09-03 ENCOUNTER — Telehealth: Payer: Self-pay | Admitting: Emergency Medicine

## 2017-09-03 NOTE — Telephone Encounter (Signed)
Spoke with patient. She is requesting her CT scan results from 08/29/17.   RB, please advise. Thanks!

## 2017-09-04 NOTE — Telephone Encounter (Signed)
Advised pt of results. She states she has the cough medication prescribed but she is still coughing. She would like to see if RB can prescribe something else, she doesn't know what to do and it gets worse at night. RB please advise.

## 2017-09-04 NOTE — Telephone Encounter (Signed)
Please let her know that her CT chest shows stable mild scar-like changes in the right mid-lung. These have not changed since her previous films and likely represent old changes from sarcoidosis, not active disease. This is good news.

## 2017-09-05 NOTE — Telephone Encounter (Signed)
Spoke with pt, she states she was taken off the Lisinopril and put on Diovan. She is going to try to give it more time to leave her system but will call back if there is no improvement. FYi RB.     valsartan (DIOVAN) 160 MG tablet [[id number]][[id number]]

## 2017-09-05 NOTE — Telephone Encounter (Signed)
Let her know that one other intervention that we could make would be to change her lisinopril to an alternative.  Lisinopril is known to precipitate and to exacerbate cough.  If she would like for us to do this then I can arrange for an alternative.

## 2017-09-10 ENCOUNTER — Telehealth: Payer: Self-pay | Admitting: Emergency Medicine

## 2017-09-10 MED ORDER — HYDROCOD POLST-CPM POLST ER 10-8 MG/5ML PO SUER: 5.0000 mL | Freq: Two times a day (BID) | ORAL | 0 refills | Status: DC | PRN

## 2017-09-10 NOTE — Telephone Encounter (Signed)
I can sign script.

## 2017-09-10 NOTE — Telephone Encounter (Signed)
We can try cough suppression with tussionex 10cc po q12h prn for cough, no RF Also can try tessalon perles 200mg  q6h prn cough, no RF

## 2017-09-10 NOTE — Telephone Encounter (Signed)
RB is not in clinic and cannot sign tussionex rx.   Sending to DOD- VS please advise if you're willing to sign tussionex rx in RB's absence.  Thanks.

## 2017-09-10 NOTE — Telephone Encounter (Signed)
RX has been printed and signed by Samara DeistVS. Spoke with patient. She is aware.   RX has been placed up front for patient to pick up.

## 2017-09-10 NOTE — Telephone Encounter (Signed)
Pt c/o prod cough with clear mucus worse qhs, chest and rib soreness from coughing.  S/s present X3 mos.  Pt states that the med regimen that she was prescribed at last OV has not made her cough any better.  Pt is requesting additional recs.    Pt uses [facility] on [address].   RB please advise.  Thanks.

## 2017-09-30 ENCOUNTER — Ambulatory Visit (INDEPENDENT_AMBULATORY_CARE_PROVIDER_SITE_OTHER): Payer: BLUE CROSS/BLUE SHIELD | Admitting: Emergency Medicine

## 2017-09-30 ENCOUNTER — Ambulatory Visit: Payer: Self-pay | Admitting: Emergency Medicine

## 2017-09-30 DIAGNOSIS — D869 Sarcoidosis, unspecified: Secondary | ICD-10-CM

## 2017-09-30 LAB — PULMONARY FUNCTION TEST
DL/VA % pred: 99 %
DL/VA: 4.77 ml/min/mmHg/L
DLCO cor % pred: 75 %
DLCO cor: 18.21 ml/min/mmHg
DLCO unc % pred: 66 %
DLCO unc: 16.12 ml/min/mmHg
FEF 25-75 Post: 1.31 L/sec
FEF 25-75 Pre: 1.35 L/sec
FEF2575-%Change-Post: -2 %
FEF2575-%Pred-Post: 54 %
FEF2575-%Pred-Pre: 56 %
FEV1-%Change-Post: 0 %
FEV1-%Pred-Post: 85 %
FEV1-%Pred-Pre: 85 %
FEV1-Post: 1.95 L
FEV1-Pre: 1.96 L
FEV1FVC-%Change-Post: 0 %
FEV1FVC-%Pred-Pre: 90 %
FEV6-%Change-Post: 0 %
FEV6-%Pred-Post: 94 %
FEV6-%Pred-Pre: 94 %
FEV6-Post: 2.64 L
FEV6-Pre: 2.64 L
FEV6FVC-%Change-Post: 0 %
FEV6FVC-%Pred-Post: 102 %
FEV6FVC-%Pred-Pre: 101 %
FVC-%Change-Post: 0 %
FVC-%Pred-Post: 92 %
FVC-%Pred-Pre: 93 %
FVC-Post: 2.66 L
FVC-Pre: 2.67 L
Post FEV1/FVC ratio: 73 %
Post FEV6/FVC ratio: 99 %
Pre FEV1/FVC ratio: 74 %
Pre FEV6/FVC Ratio: 99 %
RV % pred: 82 %
RV: 1.49 L
TLC % pred: 78 %
TLC: 3.96 L

## 2017-09-30 NOTE — Progress Notes (Signed)
PFT done today. 

## 2017-10-20 ENCOUNTER — Encounter: Payer: Self-pay | Admitting: Emergency Medicine

## 2017-10-20 ENCOUNTER — Ambulatory Visit: Payer: BLUE CROSS/BLUE SHIELD | Admitting: Emergency Medicine

## 2017-10-20 DIAGNOSIS — R053 Chronic cough: Secondary | ICD-10-CM

## 2017-10-20 DIAGNOSIS — D869 Sarcoidosis, unspecified: Secondary | ICD-10-CM | POA: Diagnosis not present

## 2017-10-20 DIAGNOSIS — R05 Cough: Secondary | ICD-10-CM | POA: Diagnosis not present

## 2017-10-20 NOTE — Assessment & Plan Note (Signed)
Hold off on any steroids at this time.  Pulmonary function testing does show some evidence for probable mild obstruction.  She has albuterol to use as needed.  No indication to start bronchodilators for now.  She has a reddish rash on the left upper arm, question related to sarcoidosis.  She is going to use hydrocortisone cream.

## 2017-10-20 NOTE — Assessment & Plan Note (Signed)
Improved since last time.  This may be due to the addition of PPI.  Discussed possibly stopping it with her today but in the end we decided to continue until she completes her gastroenterology evaluation.  She will revisit the question with them.  If her cough returns off of the medication then she likely needs to stay on it.  Also note that she is on an ACE inhibitor, discussed stopping this with her today.  Again if the cough returns we may need to do so

## 2017-10-20 NOTE — Progress Notes (Signed)
Subjective:    Patient ID: Hannah Ellis, female    DOB: 08-Jan-1966, 52 y.o.   MRN: [id number]  HPI 52 year old woman with sarcoidosis diagnosed by bronchoscopy in 06/2016. She has pulmonary involvement also a history of a nodular rash.  She was tapered off steroids at last visit based on improvement in her CT scan from 12/05/16. I referred her to dermatology to evaluate the rash, attempt to correlate it with her sarcoidosis if possible - they did think it was related, treated with a steroid cream. Breathing well, stays active. Exercises 3 days a week. She sees opthalmology.    ROV 08/26/17 --patient has a history of sarcoidosis with pulmonary and skin involvement. She began to develop a cough about 2 months ago after our initial visit. Minimally productive, some clear mucous at that time. Can wake her from sleep. She was seen in the ED 11/29 and a CXR showed persistent but improved R mid lung opacity. No significant nasal gtt, no change in reflux sx - happens rarely. Takes PPI prn. Her rash comes and goes, had to use her steroids cream beginning yesterday.   ROV 10/20/17 --this is a follow-up visit for patient with a history of pulmonary sarcoidosis also with skin involvement.  I saw her a month ago with cough that we tried to treat conservatively but then she developed some evidence of hemoptysis.  A CT scan of her chest was done on 08/29/17 that I reviewed this showed no significant changes in right upper lobe peribronchovascular reticular opacity, stable over 9 months. We started PPI last time, cough is better, no more hemoptysis. She has a new erythematous rash upper L arm.    She underwent PFT on 1/8 that I reviewed.  Mild obstrction on spirometry, slightly restricted TLC, decreased diffusion capacity that corrects to the normal range when adjusted for alveolar volume   Review of Systems  Constitutional: Negative.  Negative for fever and unexpected weight change.  HENT: Negative.  Negative for  congestion, dental problem, ear pain, nosebleeds, postnasal drip, rhinorrhea, sinus pressure, sneezing, sore throat and trouble swallowing.   Eyes: Negative.  Negative for redness and itching.  Respiratory: Negative for cough, chest tightness, shortness of breath and wheezing.   Cardiovascular: Negative.  Negative for palpitations and leg swelling.  Gastrointestinal: Negative.  Negative for nausea and vomiting.  Genitourinary: Negative.  Negative for dysuria.  Musculoskeletal: Negative.  Negative for joint swelling.  Skin: Positive for rash.  Neurological: Negative.  Negative for headaches.  Hematological: Negative.  Does not bruise/bleed easily.  Psychiatric/Behavioral: Negative.  Negative for dysphoric mood. The patient is not nervous/anxious.     Past Medical History:  Diagnosis Date  . Arthritis    "knees" (02/17/2017)  . 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 Depression   . GERD (gastroesophageal reflux disease)   . Hypertension   . Pneumonia ~ 2017  . Sarcoidosis, lung (HCC)   . Stroke (HCC) 05/09/2015   "vs TIA; mild"   . TIA (transient ischemic attack) 05/09/2015   "vs CVA; denies residual on 02/17/2017)     Family History  Problem Relation Age of Onset  . Hypertension Mother   . Colon cancer Mother   . Hypertension Father   . Hypertension Brother      Social History   Socioeconomic History  . Marital status: Married    Spouse name: Not on file  . Number of children: Not on file  . Years of education: Not  on file  . Highest education level: Not on file  Social Needs  . Financial resource strain: Not on file  . Food insecurity - worry: Not on file  . Food insecurity - inability: Not on file  . Transportation needs - medical: Not on file  . Transportation needs - non-medical: Not on file  Occupational History  . Not on file  Tobacco Use  . Smoking status: Never Smoker  . Smokeless tobacco: Never Used  Substance and Sexual Activity  . Alcohol use: No  . Drug  use: No  . Sexual activity: Yes  Other Topics Concern  . Not on file  Social History Narrative  . Not on file  lives in [place], originally from [place].  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   No Known Allergies   Outpatient Medications Prior to Visit  Medication Sig Dispense Refill  . amLODipine (NORVASC) 10 MG tablet Take 1 tablet (10 mg total) by mouth daily. 30 tablet 0  . aspirin EC 325 MG EC tablet Take 1 tablet (325 mg total) by mouth daily. 30 tablet 0  . atorvastatin (LIPITOR) 10 MG tablet Take 1 tablet (10 mg total) by mouth daily at 6 PM. 30 tablet 0  . benzonatate (TESSALON) 100 MG capsule Take 1 capsule (100 mg total) every 6 (six) hours as needed by mouth for cough. 30 capsule 1  . chlorpheniramine-HYDROcodone (TUSSIONEX PENNKINETIC ER) 10-8 MG/5ML SUER Take 5 mLs by mouth every 12 (twelve) hours as needed for cough. 140 mL 0  . hydrALAZINE (APRESOLINE) 10 MG tablet Take 1 tablet (10 mg total) by mouth every 8 (eight) hours. 90 tablet 0  . hydrALAZINE (APRESOLINE) 25 MG tablet Take 25 mg by mouth 2 (two) times daily.  0  . hydrochlorothiazide (HYDRODIURIL) 25 MG tablet Take 1 tablet (25 mg total) by mouth daily. 30 tablet 0  . levofloxacin (LEVAQUIN) 500 MG tablet Take 1 tablet (500 mg total) by mouth daily. 7 tablet 0  . medroxyPROGESTERone (PROVERA) 10 MG tablet Take 1 tablet (10 mg total) by mouth daily. 7 tablet 0  . metoprolol tartrate (LOPRESSOR) 25 MG tablet Take 25 mg by mouth 2 (two) times daily.    Marland Kitchen omeprazole (PRILOSEC) 40 MG capsule Take 40 mg by mouth daily as needed (heartburn).   0  . Pediatric Multiple Vit-C-FA (FLINSTONES GUMMIES OMEGA-3 DHA) CHEW Chew 1 tablet by mouth daily.     Marland Kitchen triamcinolone cream (KENALOG) 0.1 % Apply 1 application topically daily. Dry skin  0  . valsartan (DIOVAN) 160 MG tablet Take 1 tablet (160 mg total) by mouth daily. 30 tablet 5  . guaiFENesin-codeine (ROBITUSSIN AC) 100-10 MG/5ML syrup Take 5 mLs by mouth 3 (three)  times daily as needed for cough. 120 mL 0  . lisinopril (PRINIVIL,ZESTRIL) 40 MG tablet Take 1 tablet (40 mg total) by mouth daily. 30 tablet 0   No facility-administered medications prior to visit.         Objective:   Physical Exam Vitals:   10/20/17 1435 10/20/17 1436  BP:  110/76  Pulse:  85  SpO2:  96%  Weight: 195 lb (88.5 kg)   Height: 5\' 4"  (1.626 m)    Gen: Pleasant, well-nourished, in no distress,  normal affect  ENT: No lesions,  mouth clear,  oropharynx clear, no postnasal drip  Neck: No JVD, no stridor  Lungs: No use of accessory muscles, clear without rales or rhonchi  Cardiovascular: RRR, heart sounds normal,  no murmur or gallops, no peripheral edema  Musculoskeletal: No deformities, no cyanosis or clubbing  Neuro: alert, non focal  Skin: slightly raised papular rash L upper arm     Assessment & Plan:  Chronic cough Improved since last time.  This may be due to the addition of PPI.  Discussed possibly stopping it with her today but in the end we decided to continue until she completes her gastroenterology evaluation.  She will revisit the question with them.  If her cough returns off of the medication then she likely needs to stay on it.  Also note that she is on an ACE inhibitor, discussed stopping this with her today.  Again if the cough returns we may need to do so  Sarcoidosis (HCC) Hold off on any steroids at this time.  Pulmonary function testing does show some evidence for probable mild obstruction.  She has albuterol to use as needed.  No indication to start bronchodilators for now.  She has a reddish rash on the left upper arm, question related to sarcoidosis.  She is going to use hydrocortisone cream.   Levy Pupa, MD, PhD 10/20/2017, 5:05 PM [facility] [phone] or if no answer [phone]

## 2017-10-20 NOTE — Patient Instructions (Addendum)
Please continue your omeprazole as you are taking it. Discuss with gastroenterology to see if they believe you can try to stop this medication. If your cough returns off of the omeprazole then you likely will need to restart Your albuterol available to use 2 puffs if needed for shortness of breath, cough, chest tightness You your topical cortisone cream for your rash on the arm.  If the rash is not resolving then please call our office and we may need to evaluate further. Follow with Dr Delton CoombesByrum in 4 months or sooner if you have any problems.

## 2018-02-04 ENCOUNTER — Ambulatory Visit: Payer: BLUE CROSS/BLUE SHIELD | Admitting: Emergency Medicine

## 2018-02-04 ENCOUNTER — Encounter: Payer: Self-pay | Admitting: Emergency Medicine

## 2018-02-04 DIAGNOSIS — D869 Sarcoidosis, unspecified: Secondary | ICD-10-CM

## 2018-02-04 DIAGNOSIS — R05 Cough: Secondary | ICD-10-CM

## 2018-02-04 DIAGNOSIS — R053 Chronic cough: Secondary | ICD-10-CM

## 2018-02-04 NOTE — Patient Instructions (Signed)
Please keep your albuterol available to use 2 puffs if needed for shortness of breath, wheezing, chest tightness. Use your topical steroid cream for any evidence of skin rash.  If the rash progresses then please call us so that we can evaluate further. We will plan to follow-up in December 2019 with a chest x-ray.  Please call us and follow-up sooner if your breathing changes in any way.

## 2018-02-04 NOTE — Progress Notes (Signed)
Subjective:    Patient ID: Hannah Ellis, female    DOB: May 08, 1966, 52 y.o.   MRN: [id number]  HPI 52 year old woman with sarcoidosis diagnosed by bronchoscopy in 06/2016. She has pulmonary involvement also a history of a nodular rash.  She was tapered off steroids at last visit based on improvement in her CT scan from 12/05/16. I referred her to dermatology to evaluate the rash, attempt to correlate it with her sarcoidosis if possible - they did think it was related, treated with a steroid cream. Breathing well, stays active. Exercises 3 days a week. She sees opthalmology.    ROV 08/26/17 --patient has a history of sarcoidosis with pulmonary and skin involvement. She began to develop a cough about 2 months ago after our initial visit. Minimally productive, some clear mucous at that time. Can wake her from sleep. She was seen in the ED 11/29 and a CXR showed persistent but improved R mid lung opacity. No significant nasal gtt, no change in reflux sx - happens rarely. Takes PPI prn. Her rash comes and goes, had to use her steroids cream beginning yesterday.   ROV 10/20/17 --this is a follow-up visit for patient with a history of pulmonary sarcoidosis also with skin involvement.  I saw her a month ago with cough that we tried to treat conservatively but then she developed some evidence of hemoptysis.  A CT scan of her chest was done on 08/29/17 that I reviewed this showed no significant changes in right upper lobe peribronchovascular reticular opacity, stable over 9 months. We started PPI last time, cough is better, no more hemoptysis. She has a new erythematous rash upper L arm.    She underwent PFT on 1/8 that I reviewed.  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  She has mild obstruction on spirometry.  She has right  upper lobe peribronchovascular reticular opacity that is been stable on CT scan of the chest.  We have been dealing with cough, improved last time after addition PPI. She is now on ARB instead of ACE-I. Since last time she has seen GI and has been dx with a peptic ulcer. She has UE rash that comes and goes with steroid cream. She has albuterol but rarely uses it.    Review of Systems  Constitutional: Negative.  Negative for fever and unexpected weight change.  HENT: Negative.  Negative for congestion, dental problem, ear pain, nosebleeds, postnasal drip, rhinorrhea, sinus pressure, sneezing, sore throat and trouble swallowing.   Eyes: Negative.  Negative for redness and itching.  Respiratory: Negative for cough, chest tightness, shortness of breath and wheezing.   Cardiovascular: Negative.  Negative for palpitations and leg swelling.  Gastrointestinal: Negative.  Negative for nausea and vomiting.  Genitourinary: Negative.  Negative for dysuria.  Musculoskeletal: Negative.  Negative for joint swelling.  Skin: Positive for rash.  Neurological: Negative.  Negative for headaches.  Hematological: Negative.  Does not bruise/bleed easily.  Psychiatric/Behavioral: Negative.  Negative for dysphoric mood. The patient is not nervous/anxious.     Past Medical History:  Diagnosis Date  . Arthritis    "knees" (02/17/2017)  . 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 Depression   . GERD (gastroesophageal reflux disease)   . Hypertension   . Pneumonia ~ 2017  . Sarcoidosis, lung (HCC)   . Stroke (HCC) 05/09/2015   "  vs TIA; mild"   . TIA (transient ischemic attack) 05/09/2015   "vs CVA; denies residual on 02/17/2017)     Family History  Problem Relation Age of Onset  . Hypertension Mother   . Colon cancer Mother   . Hypertension Father   . Hypertension Brother      Social History   Socioeconomic History  . Marital status: Married    Spouse name: Not on file  . Number of children: Not on file    . Years of education: Not on file  . Highest education level: Not on file  Occupational History  . Not on file  Social Needs  . Financial resource strain: Not on file  . Food insecurity:    Worry: Not on file    Inability: Not on file  . Transportation needs:    Medical: Not on file    Non-medical: Not on file  Tobacco Use  . Smoking status: Never Smoker  . Smokeless tobacco: Never Used  Substance and Sexual Activity  . Alcohol use: No  . Drug use: No  . Sexual activity: Yes  Lifestyle  . Physical activity:    Days per week: Not on file    Minutes per session: Not on file  . Stress: Not on file  Relationships  . Social connections:    Talks on phone: Not on file    Gets together: Not on file    Attends religious service: Not on file    Active member of club or organization: Not on file    Attends meetings of clubs or organizations: Not on file    Relationship status: Not on file  . Intimate partner violence:    Fear of current or ex partner: Not on file    Emotionally abused: Not on file    Physically abused: Not on file    Forced sexual activity: Not on file  Other Topics Concern  . Not on file  Social History Narrative  . Not on file  lives in [place], originally from [place].  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   No Known Allergies   Outpatient Medications Prior to Visit  Medication Sig Dispense Refill  . amLODipine (NORVASC) 10 MG tablet Take 1 tablet (10 mg total) by mouth daily. 30 tablet 0  . aspirin EC 325 MG EC tablet Take 1 tablet (325 mg total) by mouth daily. 30 tablet 0  . atorvastatin (LIPITOR) 10 MG tablet Take 1 tablet (10 mg total) by mouth daily at 6 PM. 30 tablet 0  . hydrochlorothiazide (HYDRODIURIL) 25 MG tablet Take 1 tablet (25 mg total) by mouth daily. 30 tablet 0  . medroxyPROGESTERone (PROVERA) 10 MG tablet Take 1 tablet (10 mg total) by mouth daily. 7 tablet 0  . metoprolol tartrate (LOPRESSOR) 25 MG tablet Take 25 mg by  mouth 2 (two) times daily.    Marland Kitchen omeprazole (PRILOSEC) 40 MG capsule Take 40 mg by mouth daily as needed (heartburn).   0  . triamcinolone cream (KENALOG) 0.1 % Apply 1 application topically daily. Dry skin  0  . valsartan (DIOVAN) 160 MG tablet Take 1 tablet (160 mg total) by mouth daily. 30 tablet 5  . benzonatate (TESSALON) 100 MG capsule Take 1 capsule (100 mg total) every 6 (six) hours as needed by mouth for cough. 30 capsule 1  . chlorpheniramine-HYDROcodone (TUSSIONEX PENNKINETIC ER) 10-8 MG/5ML SUER Take 5 mLs by mouth every 12 (twelve) hours as needed for cough.  140 mL 0  . hydrALAZINE (APRESOLINE) 10 MG tablet Take 1 tablet (10 mg total) by mouth every 8 (eight) hours. 90 tablet 0  . hydrALAZINE (APRESOLINE) 25 MG tablet Take 25 mg by mouth 2 (two) times daily.  0  . levofloxacin (LEVAQUIN) 500 MG tablet Take 1 tablet (500 mg total) by mouth daily. 7 tablet 0  . Pediatric Multiple Vit-C-FA (FLINSTONES GUMMIES OMEGA-3 DHA) CHEW Chew 1 tablet by mouth daily.      No facility-administered medications prior to visit.         Objective:   Physical Exam Vitals:   02/04/18 1547  BP: (!) 124/96  Pulse: 68  SpO2: 95%  Weight: 195 lb (88.5 kg)  Height:  (1.626 m)   Gen: Pleasant, well-nourished, in no distress,  normal affect  ENT: No lesions,  mouth clear,  oropharynx clear, no postnasal drip  Neck: No JVD, no stridor  Lungs: No use of accessory muscles, clear without rales or rhonchi  Cardiovascular: RRR, heart sounds normal, no murmur or gallops, no peripheral edema  Musculoskeletal: No deformities, no cyanosis or clubbing  Neuro: alert, non focal  Skin: slightly raised papular rash L upper arm > less prominent than last visit     Assessment & Plan:  Chronic cough Improved. She is better since treating GERD, has also been changed from ACE-I to valsartan   Sarcoidosis (HCC) Appears to be stable at this time. We will repeat CXR in 08/2018. Base timing of any  repeat PFT on her sx's.  Keep albuterol available to use prn.   Levy Pupa, MD, PhD 02/04/2018, 4:19 PM [facility] [phone] or if no answer [phone]

## 2018-02-04 NOTE — Assessment & Plan Note (Signed)
Appears to be stable at this time. We will repeat CXR in 08/2018. Base timing of any repeat PFT on her sx's.  Keep albuterol available to use prn.

## 2018-02-04 NOTE — Assessment & Plan Note (Signed)
Improved. She is better since treating GERD, has also been changed from ACE-I to valsartan

## 2018-02-11 ENCOUNTER — Emergency Department (HOSPITAL_COMMUNITY)
Admission: EM | Admit: 2018-02-11 | Discharge: 2018-02-12 | Disposition: A | Discharge status: Home / Self Care | Payer: BLUE CROSS/BLUE SHIELD | Attending: Emergency Medicine | Admitting: Emergency Medicine

## 2018-02-11 ENCOUNTER — Encounter (HOSPITAL_COMMUNITY): Payer: Self-pay | Admitting: *Deleted

## 2018-02-11 ENCOUNTER — Other Ambulatory Visit: Payer: Self-pay

## 2018-02-11 DIAGNOSIS — R1032 Left lower quadrant pain: Secondary | ICD-10-CM | POA: Insufficient documentation

## 2018-02-11 DIAGNOSIS — I10 Essential (primary) hypertension: Secondary | ICD-10-CM | POA: Insufficient documentation

## 2018-02-11 DIAGNOSIS — Z7982 Long term (current) use of aspirin: Secondary | ICD-10-CM | POA: Diagnosis not present

## 2018-02-11 DIAGNOSIS — Z79899 Other long term (current) drug therapy: Secondary | ICD-10-CM | POA: Insufficient documentation

## 2018-02-11 LAB — COMPREHENSIVE METABOLIC PANEL
ALBUMIN: 3.6 g/dL (ref 3.5–5.0)
ALT: 37 U/L (ref 14–54)
AST: 24 U/L (ref 15–41)
Alkaline Phosphatase: 59 U/L (ref 38–126)
Anion gap: 8 (ref 5–15)
BUN: 20 mg/dL (ref 6–20)
CHLORIDE: 108 mmol/L (ref 101–111)
CO2: 25 mmol/L (ref 22–32)
CREATININE: 1.04 mg/dL — AB (ref 0.44–1.00)
Calcium: 9.8 mg/dL (ref 8.9–10.3)
GFR calc Af Amer: >60 mL/min (ref >60)
GFR calc non Af Amer: >60 mL/min (ref >60)
Glucose, Bld: 92 mg/dL (ref 65–99)
Potassium: 4 mmol/L (ref 3.5–5.1)
SODIUM: 141 mmol/L (ref 135–145)
Total Bilirubin: 0.3 mg/dL (ref 0.3–1.2)
Total Protein: 6.7 g/dL (ref 6.5–8.1)

## 2018-02-11 LAB — CBC
HCT: 43.5 % (ref 36.0–46.0)
Hemoglobin: 13.9 g/dL (ref 12.0–15.0)
MCH: 25.8 pg — AB (ref 26.0–34.0)
MCHC: 32 g/dL (ref 30.0–36.0)
MCV: 80.9 fL (ref 78.0–100.0)
PLATELETS: 148 10*3/uL — AB (ref 150–400)
RBC: 5.38 MIL/uL — AB (ref 3.87–5.11)
RDW: 15.8 % — AB (ref 11.5–15.5)
WBC: 5.7 10*3/uL (ref 4.0–10.5)

## 2018-02-11 LAB — URINALYSIS, ROUTINE W REFLEX MICROSCOPIC
Glucose, UA: NEGATIVE mg/dL
Hgb urine dipstick: NEGATIVE
KETONES UR: NEGATIVE mg/dL
Nitrite: NEGATIVE
PROTEIN: 30 mg/dL — AB
Specific Gravity, Urine: 1.03 (ref 1.005–1.030)
pH: 5 (ref 5.0–8.0)

## 2018-02-11 LAB — I-STAT BETA HCG BLOOD, ED (MC, WL, AP ONLY)

## 2018-02-11 NOTE — ED Triage Notes (Signed)
Pt was seen by her PCP two weeks ago for LLQ pain, dx with a UTi and was started and has since finished her antibiotic. REports pain is constant. Also reports missing her period for 2 months then has noticed blood on tissue when wiping but not on pad

## 2018-02-11 NOTE — ED Notes (Signed)
Urine culture in triage

## 2018-02-12 ENCOUNTER — Emergency Department (HOSPITAL_COMMUNITY): Payer: BLUE CROSS/BLUE SHIELD

## 2018-02-12 MED ORDER — TRAMADOL HCL 50 MG PO TABS: 50.0000 mg | ORAL_TABLET | Freq: Four times a day (QID) | ORAL | 0 refills | Status: DC | PRN

## 2018-02-12 MED ORDER — CIPROFLOXACIN HCL 500 MG PO TABS
500.0000 mg | ORAL_TABLET | Freq: Once | ORAL | Status: AC
Start: 2018-02-12 — End: 2018-02-12
  Administered 2018-02-12: 500 mg via ORAL
  Filled 2018-02-12: qty 1

## 2018-02-12 MED ORDER — IOHEXOL 300 MG/ML  SOLN
100.0000 mL | Freq: Once | INTRAMUSCULAR | Status: AC | PRN
  Administered 2018-02-12: 100 mL via INTRAVENOUS

## 2018-02-12 MED ORDER — MORPHINE SULFATE (PF) 4 MG/ML IV SOLN
4.0000 mg | Freq: Once | INTRAVENOUS | Status: AC
  Administered 2018-02-12: 4 mg via INTRAVENOUS
  Filled 2018-02-12: qty 1

## 2018-02-12 MED ORDER — METRONIDAZOLE 500 MG PO TABS: 500.0000 mg | ORAL_TABLET | Freq: Three times a day (TID) | ORAL | 0 refills | Status: DC

## 2018-02-12 MED ORDER — ONDANSETRON HCL 4 MG/2ML IJ SOLN
4.0000 mg | Freq: Once | INTRAMUSCULAR | Status: AC
  Administered 2018-02-12: 4 mg via INTRAVENOUS
  Filled 2018-02-12: qty 2

## 2018-02-12 MED ORDER — CIPROFLOXACIN HCL 500 MG PO TABS: 500.0000 mg | ORAL_TABLET | Freq: Two times a day (BID) | ORAL | 0 refills | Status: DC

## 2018-02-12 MED ORDER — METRONIDAZOLE 500 MG PO TABS
500.0000 mg | ORAL_TABLET | Freq: Once | ORAL | Status: AC
  Administered 2018-02-12: 500 mg via ORAL
  Filled 2018-02-12: qty 1

## 2018-02-12 NOTE — ED Notes (Signed)
D/c reviewed with patient and spouse 

## 2018-02-12 NOTE — ED Provider Notes (Signed)
[facility] EMERGENCY DEPARTMENT Provider Note   CSN: [id number] Arrival date & time: 02/11/18  1849     History   Chief Complaint Chief Complaint  Patient presents with  . Abdominal Pain    HPI Hannah Ellis is a 52 y.o. female.  The history is provided by the patient.  She has history of hypertension, sarcoidosis, transient ischemic attack and comes in with left lower quadrant pain for about the last 2 weeks.  Pain does radiate up to the back.  Is moderately severe and she rates it at 5/10.  Nothing makes it better, nothing makes it worse.  She saw her PCP 2 weeks ago who diagnosed urinary tract infection and gave her a 5-day course of nitrofurantoin which did not affect the pain at all.  There is no associated nausea or vomiting.  She denies fever or chills.  She denies constipation or diarrhea.  She denies urinary urgency or frequency or tenesmus or dysuria.  She has not taken anything for pain.  Past Medical History:  Diagnosis Date  . Arthritis    "knees" (02/17/2017)  . 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 Depression   . GERD (gastroesophageal reflux disease)   . Hypertension   . Pneumonia ~ 2017  . Sarcoidosis, lung (HCC)   . Stroke (HCC) 05/09/2015   "vs TIA; mild"   . TIA (transient ischemic attack) 05/09/2015   "vs CVA; denies residual on 02/17/2017)    Patient Active Problem List   Diagnosis Date Noted  . Chronic cough 08/26/2017  . Hypokalemia   . Abdominal pain 02/17/2017  . Pyelonephritis   . 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 Sarcoidosis 10/15/2016  . Encephalopathy, hypertensive 07/11/2015  . Anxiety state 07/11/2015  . Essential hypertension 07/11/2015  . TIA (transient ischemic attack) 05/09/2015  . Uncontrolled hypertension 05/09/2015  . Patient nonadherence 05/09/2015  . Microcytic anemia 05/09/2015    Past Surgical History:  Procedure Laterality Date  . APPENDECTOMY    . CESAREAN SECTION  1998  . EYE SURGERY Left ~ 1972    . FOOT SURGERY Right    "took some bone out of pinky and one next to it"  . TUBAL LIGATION  1998     OB History   None      Home Medications    Prior to Admission medications   Medication Sig Start Date End Date Taking? Authorizing Provider  amLODipine (NORVASC) 10 MG tablet Take 1 tablet (10 mg total) by mouth daily. 04/05/16   Tomasita Crumble, MD  aspirin EC 325 MG EC tablet Take 1 tablet (325 mg total) by mouth daily. 05/11/15   Joseph Art, DO  atorvastatin (LIPITOR) 10 MG tablet Take 1 tablet (10 mg total) by mouth daily at 6 PM. 05/11/15   Joseph Art, DO  hydrochlorothiazide (HYDRODIURIL) 25 MG tablet Take 1 tablet (25 mg total) by mouth daily. 05/11/15   Joseph Art, DO  medroxyPROGESTERone (PROVERA) 10 MG tablet Take 1 tablet (10 mg total) by mouth daily. 06/01/17   Jacalyn Lefevre, MD  metoprolol tartrate (LOPRESSOR) 25 MG tablet Take 25 mg by mouth 2 (two) times daily.    [provider]  omeprazole (PRILOSEC) 40 MG capsule Take 40 mg by mouth daily as needed (heartburn).  10/04/16   [provider]  triamcinolone cream (KENALOG) 0.1 % Apply 1 application topically daily. Dry skin 05/12/17   [provider]  valsartan (DIOVAN) 160 MG tablet  Take 1 tablet (160 mg total) by mouth daily. 08/26/17   Leslye Peer, MD    Family History Family History  Problem Relation Age of Onset  . Hypertension Mother   . Colon cancer Mother   . Hypertension Father   . Hypertension Brother     Social History Social History   Tobacco Use  . Smoking status: Never Smoker  . Smokeless tobacco: Never Used  Substance Use Topics  . Alcohol use: No  . Drug use: No     Allergies   Patient has no known allergies.   Review of Systems Review of Systems  All other systems reviewed and are negative.    Physical Exam Updated Vital Signs BP (!) 126/94 (BP Location: Right Arm)   Pulse 72   Temp 98.2 F (36.8 C) (Oral)   Resp 15   LMP 02/08/2018    SpO2 100%   Physical Exam  Nursing note and vitals reviewed.  52 year old female, resting comfortably and in no acute distress. Vital signs are significant for mildly elevated diastolic blood pressure. Oxygen saturation is 100%, which is normal. Head is normocephalic and atraumatic. PERRLA, EOMI. Oropharynx is clear. Neck is nontender and supple without adenopathy or JVD. Back is nontender in the midline.  There is mild left CVA tenderness. Lungs are clear without rales, wheezes, or rhonchi. Chest is nontender. Heart has regular rate and rhythm without murmur. Abdomen is soft, flat, with moderate tenderness in the left mid and lower abdomen.  There is no rebound or guarding.  There are no masses or hepatosplenomegaly and peristalsis is hypoactive. Extremities have no cyanosis or edema, full range of motion is present. Skin is warm and dry without rash. Neurologic: Mental status is normal, cranial nerves are intact, there are no motor or sensory deficits.  ED Treatments / Results  Labs (all labs ordered are listed, but only abnormal results are displayed) Labs Reviewed  COMPREHENSIVE METABOLIC PANEL - Abnormal; Notable for the following components:      Result Value   Creatinine, Ser 1.04 (*)    All other components within normal limits  CBC - Abnormal; Notable for the following components:   RBC 5.38 (*)    MCH 25.8 (*)    RDW 15.8 (*)    Platelets 148 (*)    All other components within normal limits  URINALYSIS, ROUTINE W REFLEX MICROSCOPIC - Abnormal; Notable for the following components:   Color, Urine AMBER (*)    APPearance CLOUDY (*)    Bilirubin Urine SMALL (*)    Protein, ur 30 (*)    Leukocytes, UA LARGE (*)    Bacteria, UA RARE (*)    All other components within normal limits  I-STAT BETA HCG BLOOD, ED ([facility], [facility], [facility] ONLY)   Radiology Ct Abdomen Pelvis W Contrast  Result Date: 02/12/2018 CLINICAL DATA:  Left lower quadrant pain EXAM: CT ABDOMEN AND PELVIS WITH  CONTRAST TECHNIQUE: Multidetector CT imaging of the abdomen and pelvis was performed using the standard protocol following bolus administration of intravenous contrast. CONTRAST:  OMNIPAQUE IOHEXOL 300 MG/ML  SOLN COMPARISON:  Feb 17, 2017 FINDINGS: Lower chest: On axial slice 3 series 4, there is a stable 3 mm nodular opacity in the lateral segment right middle lobe. There is no lung base edema or consolidation. Hepatobiliary: No focal liver lesions are evident. Gallbladder wall is not appreciably thickened. There is no biliary duct dilatation. Pancreas: There is no pancreatic mass or inflammatory focus.  Spleen: No splenic lesions are evident. Adrenals/Urinary Tract: Adrenals bilaterally appear unremarkable. Kidneys bilaterally show no evident mass or hydronephrosis on either side. There is no renal or ureteral calculus on either side. Urinary bladder is midline with wall thickness within normal limits. Stomach/Bowel: There are occasional sigmoid diverticula. There is no evidence of diverticulitis. There is no evident bowel wall or mesenteric thickening. No bowel obstruction. No free air or portal venous air. Vascular/Lymphatic: There is no abdominal aortic aneurysm. No vascular lesions are appreciable. There is no demonstrable adenopathy in the abdomen or pelvis. Reproductive: The uterus is anteverted. There are dominant follicles in each ovary. Beyond apparent physiologic follicles, there is no evident pelvic mass. Other: Appendix is absent. There is no periappendiceal region inflammatory change. There is no abscess or ascites in the abdomen or pelvis. There is a small ventral hernia containing only fat. Musculoskeletal: There are foci of degenerative change in the lumbar spine. There are no blastic or lytic bone lesions. No intramuscular or abdominal wall lesions are evident. IMPRESSION: 1. Occasional sigmoid diverticula without diverticulitis. No bowel wall thickening or bowel obstruction evident. No  abscess in the abdomen pelvis. Appendix absent. 2.  No renal or ureteral calculi.  No hydronephrosis. 3.  Small ventral hernia containing only fat. 4.  Stable 3 mm nodular opacity right middle lobe. Electronically Signed   By: Bretta Bang III M.D.   On: 02/12/2018 07:57    Procedures Procedures   Medications Ordered in ED Medications  ciprofloxacin (CIPRO) tablet 500 mg (has no administration in time range)  metroNIDAZOLE (FLAGYL) tablet 500 mg (has no administration in time range)  morphine 4 MG/ML injection 4 mg (4 mg Intravenous Given 02/12/18 0604)  ondansetron (ZOFRAN) injection 4 mg (4 mg Intravenous Given 02/12/18 0604)  iohexol (OMNIPAQUE) 300 MG/ML solution 100 mL (100 mLs Intravenous Contrast Given 02/12/18 0706)     Initial Impression / Assessment and Plan / ED Course  I have reviewed the triage vital signs and the nursing notes.  Pertinent labs & imaging results that were available during my care of the patient were reviewed by me and considered in my medical decision making (see chart for details).  Left lower quadrant pain.  Diverticulitis seems most likely diagnosis.  Consider urinary tract infection, pyelonephritis, urolithiasis.  Old records are reviewed showing office evaluation for left lower quadrant pain and cystitis diagnosed by CT scan.  CT at that time did show diverticulosis without diverticulitis.  Today, WBC is normal.  Urinalysis does show 11-20 WBCs, but rare bacteria and negative nitrite.  Will send for CT scan to look for evidence of diverticulitis.  CT scan shows diverticulosis without evidence of diverticulitis.  However, given persistence of pain, will empirically treat for diverticulitis.  This would also provide adequate treatment for a UTI.  She is discharged with prescriptions for ciprofloxacin and metronidazole as well as tramadol for pain not relieved with acetaminophen or ibuprofen.  Follow-up with PCP.  Return precautions discussed.  Final  Clinical Impressions(s) / ED Diagnoses   Final diagnoses:  Left lower quadrant pain    ED Discharge Orders        Ordered    ciprofloxacin (CIPRO) 500 MG tablet  2 times daily     02/12/18 0807    metroNIDAZOLE (FLAGYL) 500 MG tablet  3 times daily     02/12/18 0807    traMADol (ULTRAM) 50 MG tablet  Every 6 hours PRN     02/12/18 0807  Dione Booze, MD 02/12/18 [phone]

## 2018-09-08 ENCOUNTER — Ambulatory Visit (INDEPENDENT_AMBULATORY_CARE_PROVIDER_SITE_OTHER): Payer: 59 | Admitting: Emergency Medicine

## 2018-09-08 ENCOUNTER — Encounter: Payer: Self-pay | Admitting: Emergency Medicine

## 2018-09-08 DIAGNOSIS — D869 Sarcoidosis, unspecified: Secondary | ICD-10-CM | POA: Diagnosis not present

## 2018-09-08 NOTE — Progress Notes (Signed)
Subjective:    Patient ID: Hannah Ellis, female    DOB: [birth year], 52 y.o.   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  She has mild obstruction on spirometry.  She has right upper lobe peribronchovascular reticular opacity that is been stable on CT scan of the chest.  We have been dealing with cough, improved last time after addition PPI. She is now on ARB instead of ACE-I. Since last time she has seen GI and has been dx with a peptic ulcer. She has UE rash that comes and goes with steroid cream. She has albuterol but rarely uses it.   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  She has mild obstruction on spirometry.  We have followed a right upper lobe peribronchovascular reticular opacity by CT scan of the chest.  We have also treated her in the past for chronic cough that seem to have contributions from ACE inhibitor, GERD. She has albuterol, never needs it. She is back to an exercise routine. Her rash has been stable.    Review of Systems  Constitutional: Negative.  Negative for fever and unexpected weight change.  HENT: Negative.  Negative for congestion, dental problem, ear pain, nosebleeds, postnasal drip, rhinorrhea, sinus pressure, sneezing, sore throat and trouble swallowing.   Eyes: Negative.  Negative for redness and itching.  Respiratory: Negative for cough, chest tightness, shortness of breath and wheezing.   Cardiovascular: Negative.  Negative for palpitations and leg swelling.  Gastrointestinal: Negative.  Negative for nausea and vomiting.  Genitourinary: Negative.  Negative for dysuria.  Musculoskeletal: Negative.  Negative for joint swelling.  Skin: Positive for rash.  Neurological: Negative.  Negative for headaches.  Hematological: Negative.  Does not bruise/bleed easily.  Psychiatric/Behavioral: Negative.  Negative for dysphoric mood. The patient is  not nervous/anxious.     Past Medical History:  Diagnosis Date  . Arthritis    "knees" (02/17/2017)  . 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 Depression   . GERD (gastroesophageal reflux disease)   . Hypertension   . Pneumonia ~ 2017  . Sarcoidosis, lung (HCC)   . Stroke (HCC) 05/09/2015   "vs TIA; mild"   . TIA (transient ischemic attack) 05/09/2015   "vs CVA; denies residual on 02/17/2017)     Family History  Problem Relation Age of Onset  . Hypertension Mother   . Colon cancer Mother   . Hypertension Father   . Hypertension Brother      Social History   Socioeconomic History  . Marital status: Married    Spouse name: Not on file  . Number of children: Not on file  . Years of education: Not on file  . Highest education level: Not on file  Occupational History  . Not on file  Social Needs  . Financial resource strain: Not on file  . Food insecurity:    Worry: Not on file    Inability: Not on file  . Transportation needs:    Medical: Not on file    Non-medical: Not on file  Tobacco Use  . Smoking status: Never Smoker  . Smokeless tobacco: Never Used  Substance and Sexual Activity  . Alcohol use: No  . Drug use: No  . Sexual activity: Yes  Lifestyle  . Physical activity:    Days per week: Not on file    Minutes per session: Not on file  .  Stress: Not on file  Relationships  . Social connections:    Talks on phone: Not on file    Gets together: Not on file    Attends religious service: Not on file    Active member of club or organization: Not on file    Attends meetings of clubs or organizations: Not on file    Relationship status: Not on file  . Intimate partner violence:    Fear of current or ex partner: Not on file    Emotionally abused: Not on file    Physically abused: Not on file    Forced sexual activity: Not on file  Other Topics Concern  . Not on file  Social History Narrative  . Not on file  lives in [place], originally from [place].  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   No Known Allergies   Outpatient Medications Prior to Visit  Medication Sig Dispense Refill  . albuterol (PROVENTIL HFA;VENTOLIN HFA) 108 (90 Base) MCG/ACT inhaler Inhale 1-2 puffs into the lungs every 6 (six) hours as needed for wheezing or shortness of breath.    Marland Kitchen amLODipine (NORVASC) 10 MG tablet Take 1 tablet (10 mg total) by mouth daily. 30 tablet 0  . aspirin EC 325 MG EC tablet Take 1 tablet (325 mg total) by mouth daily. 30 tablet 0  . dicyclomine (BENTYL) 20 MG tablet Take 20 mg by mouth every 6 (six) hours as needed for spasms.    . ferrous sulfate 325 (65 FE) MG tablet Take 325 mg by mouth daily with breakfast.    . hydrALAZINE (APRESOLINE) 25 MG tablet Take 25 mg by mouth 2 (two) times daily.    . hydrochlorothiazide (HYDRODIURIL) 25 MG tablet Take 1 tablet (25 mg total) by mouth daily. 30 tablet 0  . omeprazole (PRILOSEC) 40 MG capsule Take 40 mg by mouth daily.   0  . traMADol (ULTRAM) 50 MG tablet Take 1 tablet (50 mg total) by mouth every 6 (six) hours as needed. 15 tablet 0  . triamcinolone cream (KENALOG) 0.1 % Apply 1 application topically daily. Dry skin  0  . valsartan (DIOVAN) 160 MG tablet Take 1 tablet (160 mg total) by mouth daily. 30 tablet 5  . ciprofloxacin (CIPRO) 500 MG tablet Take 1 tablet (500 mg total) by mouth 2 (two) times daily. (Patient not taking: Reported on 09/08/2018) 20 tablet 0  . metroNIDAZOLE (FLAGYL) 500 MG tablet Take 1 tablet (500 mg total) by mouth 3 (three) times daily. (Patient not taking: Reported on 09/08/2018) 30 tablet 0   No facility-administered medications prior to visit.         Objective:   Physical Exam Vitals:   09/08/18 1549  BP: 134/68  Pulse: 64  SpO2: 100%  Weight: 199 lb (90.3 kg)  Height: 5\' 4"  (1.626 m)   Gen: Pleasant, well-nourished, in no distress,  normal affect  ENT: No lesions,  mouth clear,  oropharynx clear, no postnasal drip  Neck: No JVD, no  stridor  Lungs: No use of accessory muscles, clear without rales or rhonchi  Cardiovascular: RRR, heart sounds normal, no murmur or gallops, no peripheral edema  Musculoskeletal: No deformities, no cyanosis or clubbing  Neuro: alert, non focal  Skin: slightly raised papular rash L upper arm, very pale red     Assessment & Plan:  Sarcoidosis (HCC) She not having any pulmonary manifestations at this time.  Doing quite well.  She does still have some rash  and uses hydrocortisone cream with good response.  Based on her stability I have not plan to do any PFT or repeat CT scan for now.  I do think she needs a repeat chest x-ray, we will do it at our next visit here.  Please continue to keep your albuterol available to use 2 puffs if needed for shortness of breath, coughing, wheezing. Congratulations on restarting her exercise routine.  Keep up the good work. We will check your chest x-ray here at your next office visit. Follow with Dr Delton Coombes in 6 months or sooner if you have any problems  Levy Pupa, MD, PhD 09/08/2018, 4:09 PM [facility] [phone] or if no answer [phone]

## 2018-09-08 NOTE — Patient Instructions (Signed)
Please continue to keep your albuterol available to use 2 puffs if needed for shortness of breath, coughing, wheezing. Congratulations on restarting her exercise routine.  Keep up the good work. We will check your chest x-ray here at your next office visit. Follow with Dr Delton CoombesByrum in 6 months or sooner if you have any problems

## 2018-09-08 NOTE — Assessment & Plan Note (Signed)
She not having any pulmonary manifestations at this time.  Doing quite well.  She does still have some rash and uses hydrocortisone cream with good response.  Based on her stability I have not plan to do any PFT or repeat CT scan for now.  I do think she needs a repeat chest x-ray, we will do it at our next visit here.  Please continue to keep your albuterol available to use 2 puffs if needed for shortness of breath, coughing, wheezing. Congratulations on restarting her exercise routine.  Keep up the good work. We will check your chest x-ray here at your next office visit. Follow with Dr Delton CoombesByrum in 6 months or sooner if you have any problems

## 2019-01-21 ENCOUNTER — Telehealth: Payer: Self-pay | Admitting: Nurse Practitioner

## 2019-01-21 ENCOUNTER — Other Ambulatory Visit: Payer: Self-pay

## 2019-01-21 DIAGNOSIS — R05 Cough: Secondary | ICD-10-CM

## 2019-01-21 DIAGNOSIS — R059 Cough, unspecified: Secondary | ICD-10-CM

## 2019-01-21 DIAGNOSIS — D869 Sarcoidosis, unspecified: Secondary | ICD-10-CM

## 2019-01-21 NOTE — Telephone Encounter (Signed)
Archie Patten would you like pt to have cxr done before rooming on 5/7?  Left message that TN will place order for cxr at 5/7 office visit per RB recommendations. She may call office back if needed.  Please continue to keep your albuterol available to use 2 puffs if needed for shortness of breath, coughing, wheezing. Congratulations on restarting her exercise routine.  Keep up the good work. We will check your chest x-ray here at your next office visit. Follow with Dr Delton Coombes in 6 months or sooner if you have any problems

## 2019-01-21 NOTE — Telephone Encounter (Signed)
Yes please place a STAT order. Thanks.

## 2019-01-21 NOTE — Telephone Encounter (Signed)
Pt aware to come a little earlier in order to have cxr performed before appt. Nothing further needed.

## 2019-01-28 ENCOUNTER — Ambulatory Visit: Payer: 59 | Admitting: Nurse Practitioner

## 2019-01-28 ENCOUNTER — Encounter: Payer: Self-pay | Admitting: Nurse Practitioner

## 2019-01-28 ENCOUNTER — Ambulatory Visit (INDEPENDENT_AMBULATORY_CARE_PROVIDER_SITE_OTHER): Payer: 59

## 2019-01-28 ENCOUNTER — Other Ambulatory Visit: Payer: Self-pay

## 2019-01-28 VITALS — BP 128/76 | HR 68 | Ht 64.0 in | Wt 190.8 lb

## 2019-01-28 DIAGNOSIS — D869 Sarcoidosis, unspecified: Secondary | ICD-10-CM

## 2019-01-28 DIAGNOSIS — R05 Cough: Secondary | ICD-10-CM | POA: Diagnosis not present

## 2019-01-28 DIAGNOSIS — R059 Cough, unspecified: Secondary | ICD-10-CM

## 2019-01-28 MED ORDER — PREDNISONE 10 MG PO TABS: ORAL_TABLET | ORAL | 0 refills | Status: DC

## 2019-01-28 MED ORDER — BENZONATATE 200 MG PO CAPS: 200.0000 mg | ORAL_CAPSULE | Freq: Three times a day (TID) | ORAL | 1 refills | Status: DC | PRN

## 2019-01-28 MED ORDER — DOXYCYCLINE HYCLATE 100 MG PO TABS: 100.0000 mg | ORAL_TABLET | Freq: Two times a day (BID) | ORAL | 0 refills | Status: DC

## 2019-01-28 NOTE — Progress Notes (Signed)
 @Patient  ID: Hannah Ellis, female    DOB: 16-Jul-1966, 53 y.o.   MRN: [id number]  Chief Complaint  Patient presents with  . Follow-up    6 Month Follow Up - Sarcoidosis    Referring provider: Maudie FlakesAnderson, Shane D, FNP  HPI  53 year old female with sarcoidosis (pulmonary involvement and nodular rash )diagnosed by bronchoscopy in October 2017 who is followed by Dr. Delton CoombesByrum  Tests:  Chest CT 08/29/17 - Since 12/05/2016, similar appearance of mild, primarily right upper lobe peribronchovascular reticular opacity. Given similarity over 9 months, and the clinical history, this likely represents pulmonary sarcoidosis. Recurrent mild infection felt less likely. No thoracic adenopathy. Esophageal air fluid level suggests dysmotility or gastroesophageal reflux.  CXR 08/21/17 - Mild reticulonodular opacity in the right mid lung, significantly decreased since the most recent comparison chest CT of 04/05/2016, probably reflecting residual pulmonary parenchymal findings of sarcoidosis. Otherwise no active disease in the chest.  PFT: PFT Results Latest Ref Rng & Units 09/30/2017  FVC-Pre L 2.67  FVC-Predicted Pre % 93  FVC-Post L 2.66  FVC-Predicted Post % 92  Pre FEV1/FVC % % 74  Post FEV1/FCV % % 73  FEV1-Pre L 1.96  FEV1-Predicted Pre % 85  FEV1-Post L 1.95  DLCO UNC% % 66  DLCO COR %Predicted % 99  TLC L 3.96  TLC % Predicted % 78  RV % Predicted % 82    OV 01/28/19 -  Follow up sarcoidosis  Patient presents today for a six-month follow-up.  She states that overall she has been doing well over the past few months.  She has been following up with dermatology for skin rash associated with sarcoid.  She does complain of cough that started 2 weeks ago.  States that the cough has been nonproductive.  She has not needed to use her albuterol.  Has not had any significant shortness of breath.  She stays active and still working.  She does wear her mask. Denies f/c/s, n/v/d, hemoptysis, PND, leg  swelling.     No Known Allergies  Immunization History  Administered Date(s) Administered  . Influenza,inj,Quad PF,6+ Mos 07/09/2018  . Influenza,inj,quad, With Preservative 07/17/2016, 06/20/2017  . Pneumococcal Polysaccharide-23 07/17/2016  . Tdap 10/17/2016    Past Medical History:  Diagnosis Date  . Arthritis    "knees" (02/17/2017)  . 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 Depression   . GERD (gastroesophageal reflux disease)   . Hypertension   . Pneumonia ~ 2017  . Sarcoidosis, lung (HCC)   . Stroke (HCC) 05/09/2015   "vs TIA; mild"   . TIA (transient ischemic attack) 05/09/2015   "vs CVA; denies residual on 02/17/2017)    Tobacco History: Social History   Tobacco Use  Smoking Status Never Smoker  Smokeless Tobacco Never Used   Counseling given: Not Answered   Outpatient Encounter Medications as of 01/28/2019  Medication Sig  . albuterol (PROVENTIL HFA;VENTOLIN HFA) 108 (90 Base) MCG/ACT inhaler Inhale 1-2 puffs into the lungs every 6 (six) hours as needed for wheezing or shortness of breath.  Marland Kitchen. amLODipine (NORVASC) 10 MG tablet Take 1 tablet (10 mg total) by mouth daily.  Marland Kitchen. aspirin EC 325 MG EC tablet Take 1 tablet (325 mg total) by mouth daily.  Marland Kitchen. dicyclomine (BENTYL) 20 MG tablet Take 20 mg by mouth every 6 (six) hours as needed for spasms.  . ferrous sulfate 325 (65 FE) MG tablet Take 325 mg by mouth daily with breakfast.  . hydrALAZINE (  APRESOLINE) 25 MG tablet Take 25 mg by mouth 2 (two) times daily.  . hydrochlorothiazide (HYDRODIURIL) 25 MG tablet Take 1 tablet (25 mg total) by mouth daily.  Marland Kitchen omeprazole (PRILOSEC) 40 MG capsule Take 40 mg by mouth daily.   . traMADol (ULTRAM) 50 MG tablet Take 1 tablet (50 mg total) by mouth every 6 (six) hours as needed.  . triamcinolone cream (KENALOG) 0.1 % Apply 1 application topically daily. Dry skin  . valsartan (DIOVAN) 160 MG tablet Take 1 tablet (160 mg total) by mouth daily.  . benzonatate (TESSALON) 200 MG  capsule Take 1 capsule (200 mg total) by mouth 3 (three) times daily as needed for cough.  . doxycycline (VIBRA-TABS) 100 MG tablet Take 1 tablet (100 mg total) by mouth 2 (two) times daily.  . predniSONE (DELTASONE) 10 MG tablet Take 4 tabs for 2 days, then 3 tabs for 2 days, then 2 tabs for 2 days, then 1 tab for 2 days, then stop   No facility-administered encounter medications on file as of 01/28/2019.      Review of Systems  Review of Systems  Constitutional: Negative.  Negative for chills and fever.  HENT: Negative.   Respiratory: Positive for cough. Negative for shortness of breath and wheezing.   Cardiovascular: Negative.  Negative for chest pain, palpitations and leg swelling.  Gastrointestinal: Negative.   Allergic/Immunologic: Negative.   Neurological: Negative.   Psychiatric/Behavioral: Negative.        Physical Exam  BP 128/76 (BP Location: Left Arm, Patient Position: Sitting, Cuff Size: Normal)   Pulse 68   Ht 5\' 4"  (1.626 m)   Wt 190 lb 12.8 oz (86.5 kg)   SpO2 99%   BMI 32.75 kg/m   Wt Readings from Last 5 Encounters:  01/28/19 190 lb 12.8 oz (86.5 kg)  09/08/18 199 lb (90.3 kg)  02/04/18 195 lb (88.5 kg)  10/20/17 195 lb (88.5 kg)  08/26/17 199 lb (90.3 kg)     Physical Exam Vitals signs and nursing note reviewed.  Constitutional:      General: She is not in acute distress.    Appearance: She is well-developed.  Cardiovascular:     Rate and Rhythm: Normal rate and regular rhythm.  Pulmonary:     Effort: Pulmonary effort is normal. No respiratory distress.     Breath sounds: Normal breath sounds. No wheezing or rhonchi.  Musculoskeletal:        General: No swelling.  Neurological:     Mental Status: She is alert and oriented to person, place, and time.      Imaging: Dg Chest 2 View  Result Date: 01/28/2019 CLINICAL DATA:  Cough EXAM: CHEST - 2 VIEW COMPARISON:  Chest x-ray dated 08/21/2017 FINDINGS: There are scattered airspace opacities  primarily in the right mid lung zone. There are few scattered airspace opacities in the left upper lobe. No pneumothorax. No large pleural effusion. The cardiac silhouette is not significantly enlarged. IMPRESSION: Progressive bilateral airspace opacities which may represent pneumonia or sequela of sarcoidosis. Electronically Signed   By: Katherine Mantle M.D.   On: 01/28/2019 16:34     Assessment & Plan:   Sarcoidosis [facility]) Presents today for a follow-up.  Overall she has been doing well over the past few months.  She does complain today of new cough that is nonproductive.  She has not had any significant shortness of breath.  Has not needed to use her albuterol.  Her chest x-ray did show progression with  opacities could represent pneumonia versus sarcoid.  We will treat with antibiotic and prednisone.  Patient Instructions  Sarcoidosis: Continue albuterol as needed Chest x ray in office today - showed pneumonia vs sarcoidosis Stay active Will order prednisone taper Will order Doxycycline Will order tessalon perles  Follow up: Follow up in 1 month or sooner if needed         Ivonne Andrew, NP 01/28/2019

## 2019-01-28 NOTE — Patient Instructions (Addendum)
Sarcoidosis: Continue albuterol as needed Chest x ray in office today - showed pneumonia vs sarcoidosis Stay active Will order prednisone taper Will order Doxycycline Will order tessalon perles  Follow up: Follow up in 1 month or sooner if needed

## 2019-01-28 NOTE — Assessment & Plan Note (Signed)
Presents today for a follow-up.  Overall she has been doing well over the past few months.  She does complain today of new cough that is nonproductive.  She has not had any significant shortness of breath.  Has not needed to use her albuterol.  Her chest x-ray did show progression with opacities could represent pneumonia versus sarcoid.  We will treat with antibiotic and prednisone.  Patient Instructions  Sarcoidosis: Continue albuterol as needed Chest x ray in office today - showed pneumonia vs sarcoidosis Stay active Will order prednisone taper Will order Doxycycline Will order tessalon perles  Follow up: Follow up in 1 month or sooner if needed

## 2019-02-03 ENCOUNTER — Telehealth: Payer: Self-pay | Admitting: Nurse Practitioner

## 2019-02-03 NOTE — Telephone Encounter (Signed)
Called and spoke with pt letting her know the results of the cxr per TN. When asked pt how she was feeling, she stated she is still coughing with occ clear phlegm. Pt stated at night she does have chest discomfort/pain. Pt denies any complaints of wheezing. Pt also denies any fever, no SOB, no body aches or chills.  Pt stated she will be finishing up abx Friday, 5/15.  Due to pt having the chest discomfort as she stated this is new, pt wanted to know any recommendations per TN. Tonya, please advise on this for pt. Thanks!

## 2019-02-03 NOTE — Telephone Encounter (Signed)
Called pt to see if it is soreness from coughing or if it was chest pain and pt stated that it is pain that she has been having. Pt stated she is fine throughout the day but then at night, she will begin to have the pain in her chest. Pt stated she is taking meds for acid reflux but has had the pain in her chest the past couple nights.  Stated to pt the information per Archie Patten that she needs to go to ED to be further evaluated and pt expressed understanding. Nothing further needed.

## 2019-02-03 NOTE — Telephone Encounter (Signed)
She needs to go to the ED if she is having chest pain. If she thinks that it is soreness from coughing so much we can order some cough medication.

## 2019-02-03 NOTE — Telephone Encounter (Signed)
Patient's chest x ray showed  opacities which may represent pneumonia or it could be from sarcoidosis. I wanted to go ahead and give her a round of antibiotics since she had been coughing to cover pneumonia so that it would not get worse. Thanks. Is she feeling any better?

## 2019-03-01 ENCOUNTER — Ambulatory Visit (INDEPENDENT_AMBULATORY_CARE_PROVIDER_SITE_OTHER): Payer: 59 | Admitting: Nurse Practitioner

## 2019-03-01 ENCOUNTER — Other Ambulatory Visit: Payer: Self-pay

## 2019-03-01 ENCOUNTER — Ambulatory Visit (INDEPENDENT_AMBULATORY_CARE_PROVIDER_SITE_OTHER): Payer: 59

## 2019-03-01 ENCOUNTER — Encounter: Payer: Self-pay | Admitting: Nurse Practitioner

## 2019-03-01 DIAGNOSIS — D869 Sarcoidosis, unspecified: Secondary | ICD-10-CM | POA: Diagnosis not present

## 2019-03-01 MED ORDER — ALBUTEROL SULFATE HFA 108 (90 BASE) MCG/ACT IN AERS: 1.0000 | INHALATION_SPRAY | Freq: Four times a day (QID) | RESPIRATORY_TRACT | 0 refills | Status: DC | PRN

## 2019-03-01 MED ORDER — BENZONATATE 200 MG PO CAPS: 200.0000 mg | ORAL_CAPSULE | Freq: Three times a day (TID) | ORAL | 1 refills | Status: DC | PRN

## 2019-03-01 NOTE — Assessment & Plan Note (Signed)
She was last seen by me on 01/28/2019.  Her x-ray at that visit was concerning for possible pneumonia.  Patient was treated with doxycycline and prednisone.  Chest x-ray today looks like area has resolved.  Patient states that she did improve after taking medication. She continues to have cough at night after wearing mask all day at work. She states that she does not notice cough at home when not wearing mask through out the day.  Cough is not productive. Patient is active and works full-time at [employer].  Patient Instructions  Please continue to keep your albuterol available to use 2 puffs if needed for shortness of breath, coughing, wheezing. May take mucinex twice daily as needed Deep breathing exercises 2-3 times daily May continue tessalon perles as needed for cough   Follow with Dr Lamonte Sakai in 3 months or sooner if you have any problems

## 2019-03-01 NOTE — Patient Instructions (Addendum)
Please continue to keep your albuterol available to use 2 puffs if needed for shortness of breath, coughing, wheezing. May take mucinex twice daily as needed Deep breathing exercises 2-3 times daily May continue tessalon perles as needed for cough   Follow with Dr Lamonte Sakai in 3 months or sooner if you have any problems

## 2019-03-01 NOTE — Progress Notes (Signed)
 @Patient  ID: Hannah Ellis, female    DOB: 06-19-1966, 53 y.o.   MRN: [id number]  Chief Complaint  Patient presents with   Follow-up    Reports her breathing has been good, does have spontaneous cough, no new concerns.    Referring provider: Maudie FlakesAnderson, Shane D, FNP  HPI 53 year old female with sarcoidosis (pulmonary involvement and nodular rash )diagnosed by bronchoscopy in October 2017 who is followed by Dr. Delton CoombesByrum  Tests:  Chest CT 08/29/17 - Since 12/05/2016, similar appearance of mild, primarily right upper lobe peribronchovascular reticular opacity. Given similarity over 9 months, and the clinical history, this likely represents pulmonary sarcoidosis. Recurrent mild infection felt less likely. No thoracic adenopathy. Esophageal air fluid level suggests dysmotility or gastroesophageal reflux.  CXR 08/21/17 - Mild reticulonodular opacity in the right mid lung, significantly decreased since the most recent comparison chest CT of 04/05/2016, probably reflecting residual pulmonary parenchymal findings of sarcoidosis. Otherwise no active disease in the chest.  PFT Results Latest Ref Rng & Units 09/30/2017  FVC-Pre L 2.67  FVC-Predicted Pre % 93  FVC-Post L 2.66  FVC-Predicted Post % 92  Pre FEV1/FVC % % 74  Post FEV1/FCV % % 73  FEV1-Pre L 1.96  FEV1-Predicted Pre % 85  FEV1-Post L 1.95  DLCO UNC% % 66  DLCO COR %Predicted % 99  TLC L 3.96  TLC % Predicted % 78  RV % Predicted % 82    OV 03/01/19 - Follow up Patient presents today for follow-up visit.  She was last seen by me on 01/28/2019.  Her x-ray at that visit was concerning for possible pneumonia.  Patient was treated with doxycycline and prednisone.  Chest x-ray today looks like area has resolved.  Patient states that she did improve after taking medication. She continues to have cough at night after wearing mask all day at work. She states that she does not notice cough at home when not wearing mask through out the day.   Patient is active and works full-time at [employer]. Denies f/c/s, n/v/d, hemoptysis, PND, leg swelling.     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   Depression    GERD (gastroesophageal reflux disease)    Hypertension    Pneumonia ~ 2017   Sarcoidosis, lung (HCC)    Stroke (HCC) 05/09/2015   "vs TIA; mild"    TIA (transient ischemic attack) 05/09/2015   "vs CVA; denies residual on 02/17/2017)    Tobacco History: Social History   Tobacco Use  Smoking Status Never Smoker  Smokeless Tobacco Never Used   Counseling given: Yes   Outpatient Encounter Medications as of 03/01/2019  Medication Sig   albuterol (VENTOLIN HFA) 108 (90 Base) MCG/ACT inhaler Inhale 1-2 puffs into the lungs every 6 (six) hours as needed for wheezing or shortness of breath.   amLODipine (NORVASC) 10 MG tablet Take 1 tablet (10 mg total) by mouth daily.   aspirin EC 325 MG EC tablet Take 1 tablet (325 mg total) by mouth daily.   benzonatate (TESSALON) 200 MG capsule Take 1 capsule (200 mg total) by mouth 3 (three) times daily as needed for cough.   dicyclomine (BENTYL) 20 MG tablet Take  20 mg by mouth every 6 (six) hours as needed for spasms.   ferrous sulfate 325 (65 FE) MG tablet Take 325 mg by mouth daily with breakfast.   hydrALAZINE (APRESOLINE) 25 MG tablet Take 25 mg by mouth 2 (two) times daily.   hydrochlorothiazide (HYDRODIURIL) 25 MG tablet Take 1 tablet (25 mg total) by mouth daily.   omeprazole (PRILOSEC) 40 MG capsule Take 40 mg by mouth daily.    topiramate (TOPAMAX) 25 MG tablet TK 4 TS PO HS   traMADol (ULTRAM) 50 MG tablet Take 1 tablet (50 mg total)  by mouth every 6 (six) hours as needed.   triamcinolone cream (KENALOG) 0.1 % Apply 1 application topically daily. Dry skin   valsartan (DIOVAN) 160 MG tablet Take 1 tablet (160 mg total) by mouth daily.   [DISCONTINUED] albuterol (PROVENTIL HFA;VENTOLIN HFA) 108 (90 Base) MCG/ACT inhaler Inhale 1-2 puffs into the lungs every 6 (six) hours as needed for wheezing or shortness of breath.   [DISCONTINUED] benzonatate (TESSALON) 200 MG capsule Take 1 capsule (200 mg total) by mouth 3 (three) times daily as needed for cough.   [DISCONTINUED] doxycycline (VIBRA-TABS) 100 MG tablet Take 1 tablet (100 mg total) by mouth 2 (two) times daily. (Patient not taking: Reported on 03/01/2019)   [DISCONTINUED] predniSONE (DELTASONE) 10 MG tablet Take 4 tabs for 2 days, then 3 tabs for 2 days, then 2 tabs for 2 days, then 1 tab for 2 days, then stop (Patient not taking: Reported on 03/01/2019)   No facility-administered encounter medications on file as of 03/01/2019.      Review of Systems  Review of Systems  Constitutional: Negative.  Negative for fever.  HENT: Negative.   Respiratory: Positive for cough. Negative for shortness of breath and wheezing.   Cardiovascular: Negative.  Negative for chest pain, palpitations and leg swelling.  Gastrointestinal: Negative.   Allergic/Immunologic: Negative.   Neurological: Negative.   Psychiatric/Behavioral: Negative.        Physical Exam  BP 112/70 (BP Location: Left Arm, Cuff Size: Normal)    Pulse 77    Temp 98 F (36.7 C) (Oral)    Ht 5\' 4"  (1.626 m)    Wt 189 lb (85.7 kg)    SpO2 100%    BMI 32.44 kg/m   Wt Readings from Last 5 Encounters:  03/01/19 189 lb (85.7 kg)  01/28/19 190 lb 12.8 oz (86.5 kg)  09/08/18 199 lb (90.3 kg)  02/04/18 195 lb (88.5 kg)  10/20/17 195 lb (88.5 kg)     Physical Exam Vitals signs and nursing note reviewed.  Constitutional:      General: She is not in acute distress.    Appearance: She is well-developed.    Cardiovascular:     Rate and Rhythm: Normal rate and regular rhythm.  Pulmonary:     Effort: Pulmonary effort is normal. No respiratory distress.     Breath sounds: Normal breath sounds. No wheezing or rhonchi.  Musculoskeletal:        General: No swelling.  Neurological:     Mental Status: She is alert and oriented to person, place, and time.      Imaging: Dg Chest 2 View  Result Date: 03/01/2019 CLINICAL DATA:  Sarcoidosis EXAM: CHEST - 2 VIEW COMPARISON:  01/28/2019, 08/21/2017 FINDINGS: There is bilateral mild chronic interstitial lung disease. There is more focal right perihilar interstitial lung disease unchanged from the prior exam of 08/21/2017. There is no pleural effusion or pneumothorax. The cardiomediastinal  silhouette is normal. IMPRESSION: No acute cardiopulmonary disease. Bilateral chronic interstitial lung disease more prominent in the right perihilar distribution. Electronically Signed   By: Elige KoHetal  Patel   On: 03/01/2019 13:38     Assessment & Plan:   Sarcoidosis [facility](HCC) She was last seen by me on 01/28/2019.  Her x-ray at that visit was concerning for possible pneumonia.  Patient was treated with doxycycline and prednisone.  Chest x-ray today looks like area has resolved.  Patient states that she did improve after taking medication. She continues to have cough at night after wearing mask all day at work. She states that she does not notice cough at home when not wearing mask through out the day.  Cough is not productive. Patient is active and works full-time at [employer].  Patient Instructions  Please continue to keep your albuterol available to use 2 puffs if needed for shortness of breath, coughing, wheezing. May take mucinex twice daily as needed Deep breathing exercises 2-3 times daily May continue tessalon perles as needed for cough   Follow with Dr Delton CoombesByrum in 3 months or sooner if you have any problems       Ivonne Andrewonya S Melissia Lahman, NP 03/01/2019

## 2019-03-22 ENCOUNTER — Ambulatory Visit: Payer: Self-pay | Admitting: Emergency Medicine

## 2019-08-01 IMAGING — CT CT CHEST W/ CM
2 of 3 series · 15 of 36 positions shown, 18 images · IV contrast (iopamidol)
Comparison: Plain films including 08/21/2017.  Chest CT 12/05/2016.

CLINICAL DATA: Sarcoidosis. Evaluate right lung infiltrate. Recent
hemoptysis. Gastroesophageal reflux disease.

EXAM:
CT CHEST WITH CONTRAST
TECHNIQUE: Multidetector CT imaging of the chest was performed during
intravenous contrast administration.
CONTRAST:  80mL T36O1O-GAA IOPAMIDOL (T36O1O-GAA) INJECTION 61%

[Series 2: thorax · axial · 0.73mm/px · z∈[-314,-66]mm · 12 of 146 slices shown, 15 images]
[im 11/146  mediastinal]
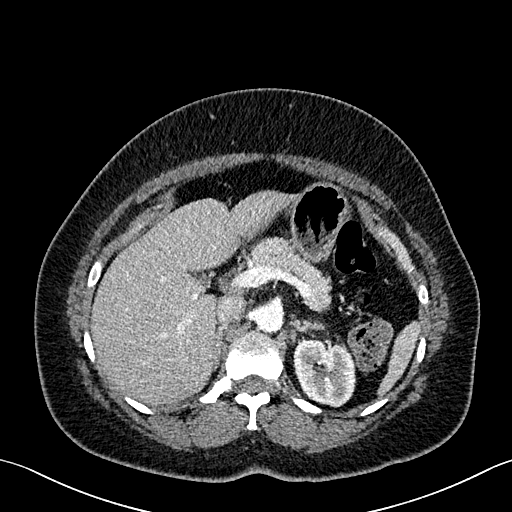
[im 11/146  lung]
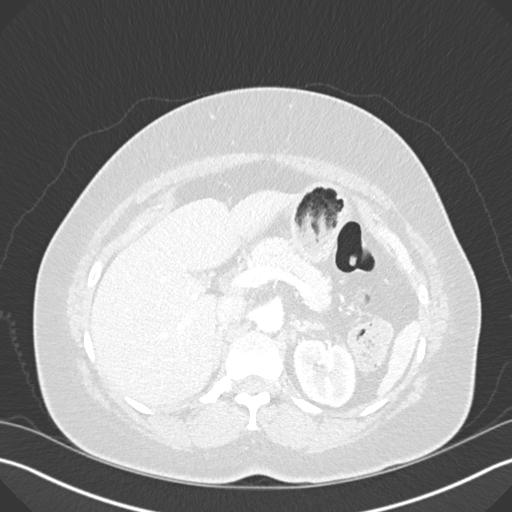
[im 22/146  lung]
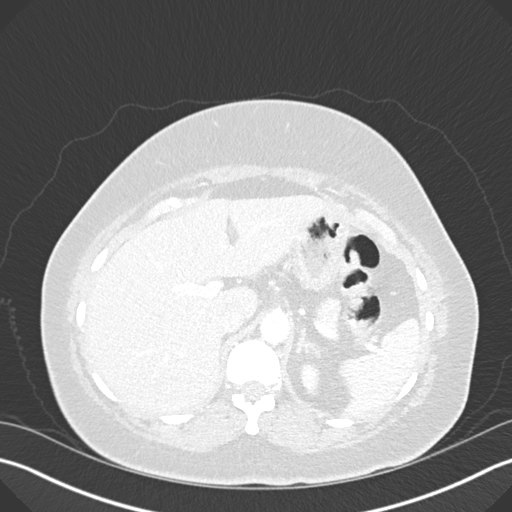
[im 33/146  lung]
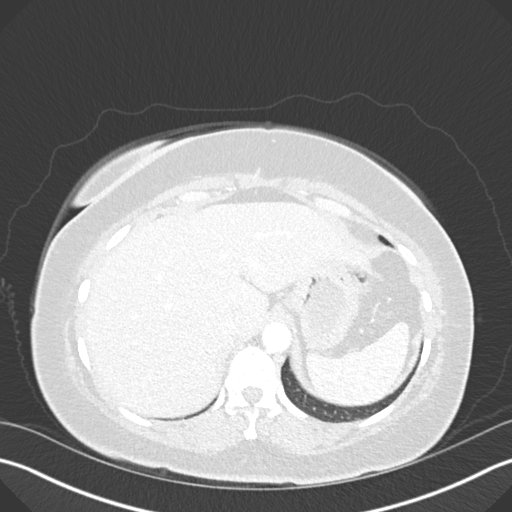
[im 43/146  lung]
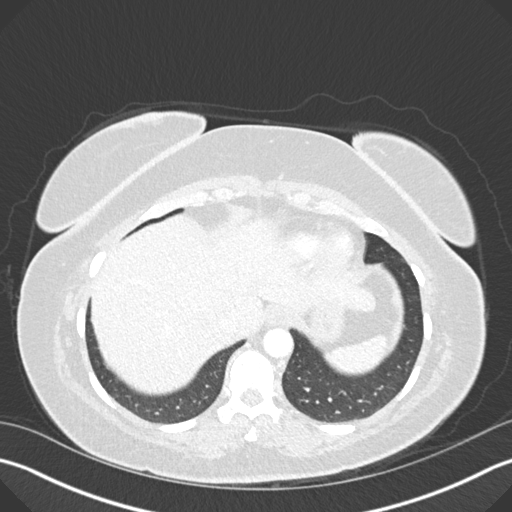
[im 54/146  mediastinal]
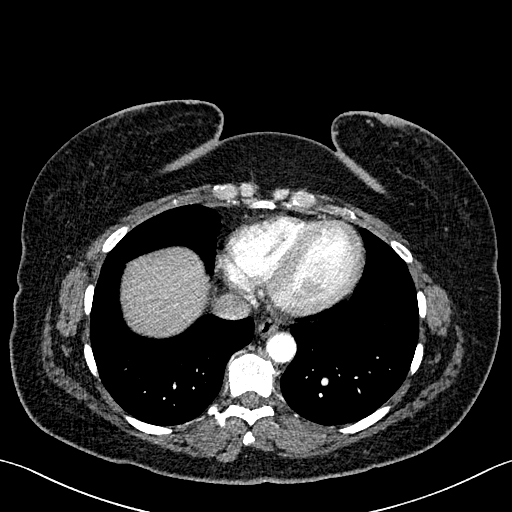
[im 54/146  lung]
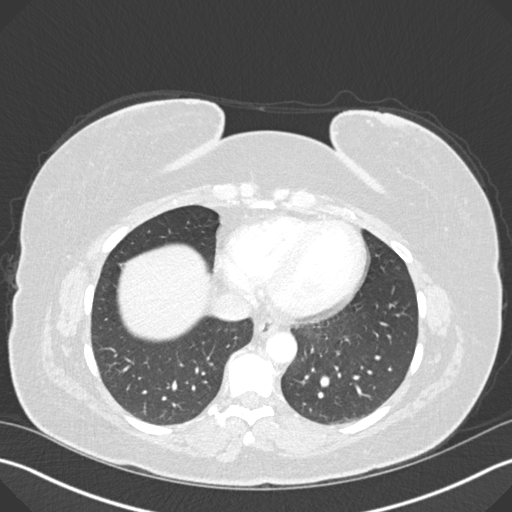
[im 65/146  lung]
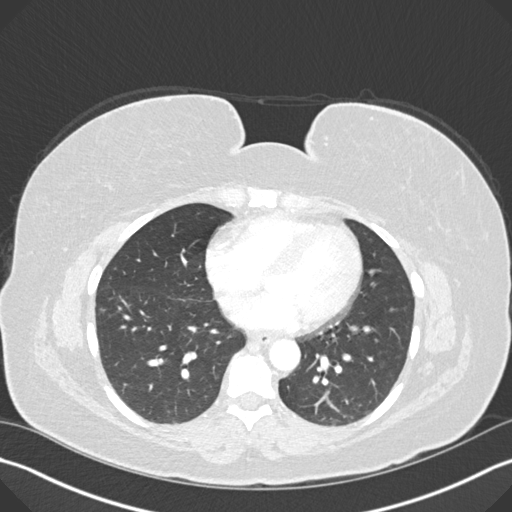
[im 81/146  lung]
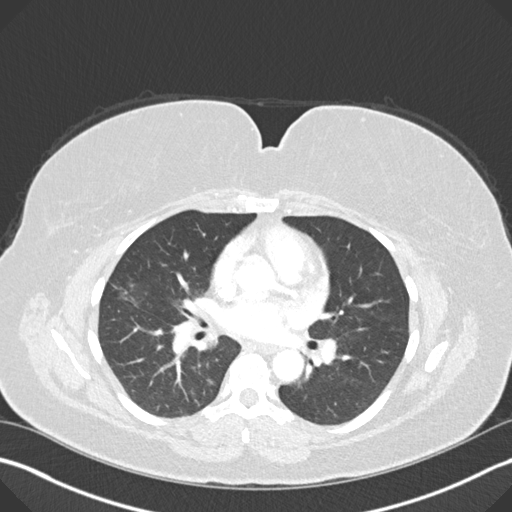
[im 92/146  lung]
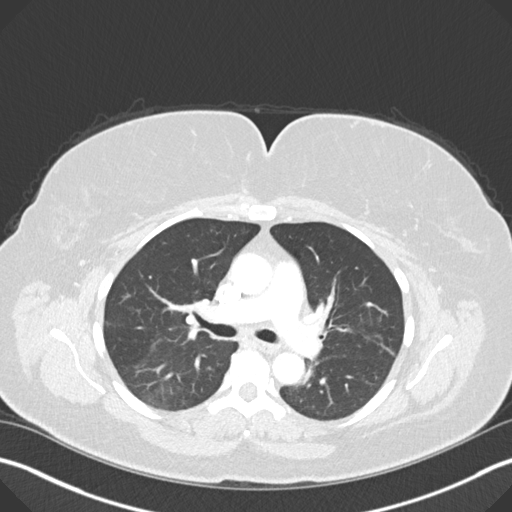
[im 103/146  mediastinal]
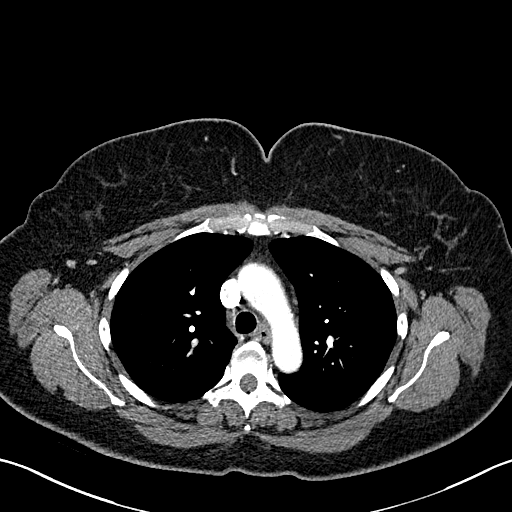
[im 103/146  lung]
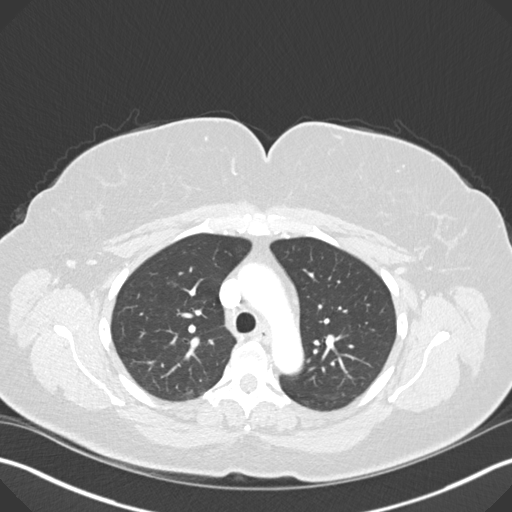
[im 113/146  lung]
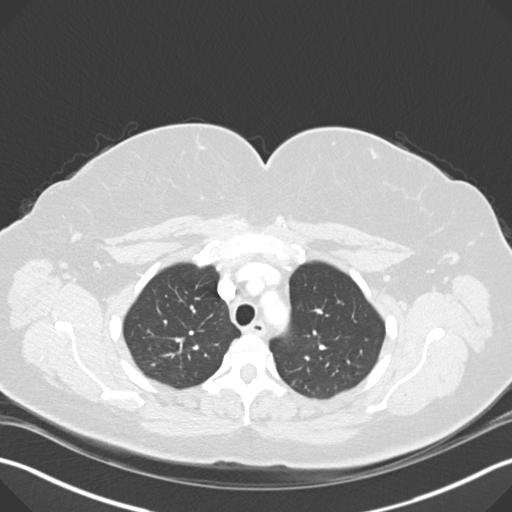
[im 124/146  lung]
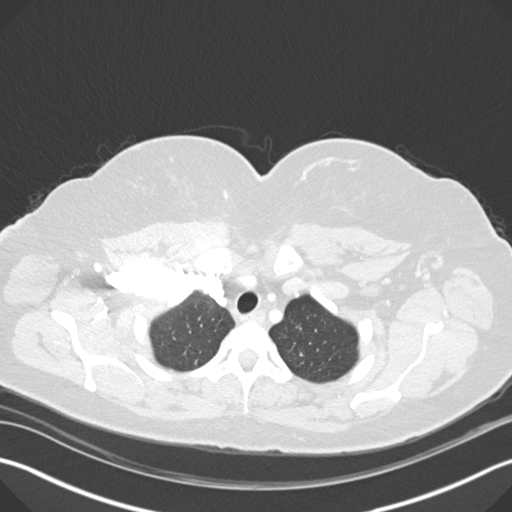
[im 135/146  lung]
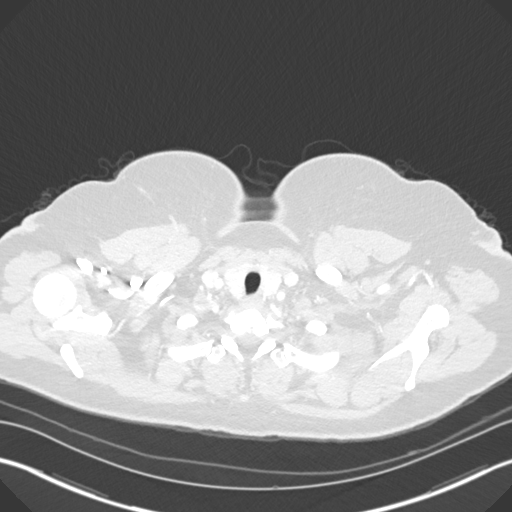

[Series 5: coronal · coronal · 0.59mm/px · 3 of 108 slices shown]
[im 22/108  lung]
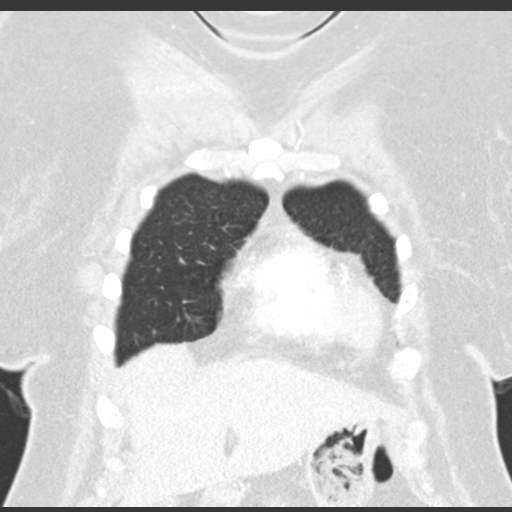
[im 43/108  lung]
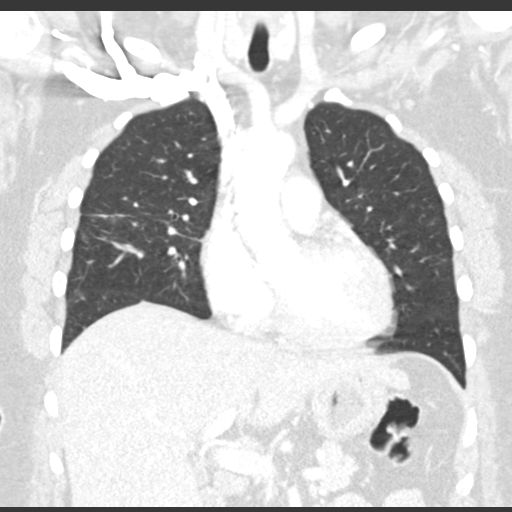
[im 65/108  lung]
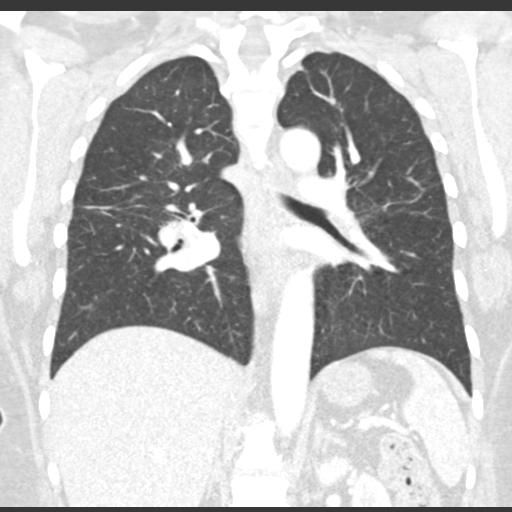

[15 of 36 positions shown; findings below may reference images not displayed]

FINDINGS: Cardiovascular: Tortuous thoracic aorta. Normal heart size, without
pericardial effusion. No central pulmonary embolism, on this
non-dedicated study.

Mediastinum/Nodes: No mediastinal or hilar adenopathy. Fluid level
in the esophagus on image 47/series 2.

Lungs/Pleura: No pleural fluid. No significant change in primarily
right upper lobe peribronchovascular mild reticular opacities. No
new areas of consolidation.

Upper Abdomen: Normal imaged portions of the liver, spleen, stomach,
pancreas, adrenal glands, kidneys.

Musculoskeletal: No acute osseous abnormality.
IMPRESSION: 1. Since 12/05/2016, similar appearance of mild, primarily right
upper lobe peribronchovascular reticular opacity. Given similarity
over 9 months, and the clinical history, this likely represents
pulmonary sarcoidosis. Recurrent mild infection felt less likely.
2. No thoracic adenopathy.
3. Esophageal air fluid level suggests dysmotility or
gastroesophageal reflux.

## 2019-09-07 ENCOUNTER — Telehealth: Payer: Self-pay | Admitting: Emergency Medicine

## 2019-09-07 NOTE — Telephone Encounter (Signed)
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

## 2019-09-13 NOTE — Telephone Encounter (Signed)
She has sarcoidosis and has been doing well - last seen by me a year ago, seen by NP since then. I think we should schedule a follow up visit to review how she is doing before I recommend to her that she change her job.

## 2019-09-13 NOTE — Telephone Encounter (Signed)
Spoke with pt, she stated that she would make an app with RB after she received her insurance from her job. I advised he to call as soon as possible so he could reassess her condition and determine if she needed to stay out of work. Nothing further is needed.

## 2019-10-08 ENCOUNTER — Ambulatory Visit (INDEPENDENT_AMBULATORY_CARE_PROVIDER_SITE_OTHER): Payer: BC Managed Care – PPO | Admitting: Emergency Medicine

## 2019-10-08 ENCOUNTER — Encounter: Payer: Self-pay | Admitting: Emergency Medicine

## 2019-10-08 ENCOUNTER — Other Ambulatory Visit: Payer: Self-pay

## 2019-10-08 DIAGNOSIS — D869 Sarcoidosis, unspecified: Secondary | ICD-10-CM | POA: Diagnosis not present

## 2019-10-08 NOTE — Patient Instructions (Signed)
I am glad that your sarcoid is quiet at this time. Please continue to practice your personal protective equipment, mask, gloves, diligent handwashing and social distancing as you do your job. Keep your albuterol available to use 2 puffs if needed for shortness of breath, chest tightness, wheezing. You are due for an ophthalmology exam at some point this year. You would be a good candidate for the COVID-19 vaccine. We will repeat a high-resolution CT scan of the chest at the end of this year to compare with your priors. We will repeat your pulmonary function testing at the end of this year as well. Please call our office if you develop any changes in breathing, coughing, new rash. Follow with Dr. Delton Coombes in December to review CT scan of the chest and pulmonary function testing.

## 2019-10-08 NOTE — Progress Notes (Signed)
Subjective:    Patient ID: Hannah Ellis, female    DOB: 1966/06/30, 54 y.o.   MRN: [id number]  HPI  ROV 10/08/19 --Hannah Ellis is 54, follows up today for her history of pulmonary and cutaneous sarcoidosis.  She has associated mild obstruction on spirometry.  Also with a right upper lobe peribronchovascular reticular opacity on CT scan of the chest, last was done 08/29/2017. A CXR 03/01/19 reviewed by me shows chronic interstitial changes especially in R perihilar region. She has had chronic cough, currently not bothersome. 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  She denies any breathing limitations. No cough. She does feel dehydrated after work - the heat is up high there. No CP. She is not having rash right now. She has not been requiring her albuterol.  She is due for an optho exam.    Review of Systems  Constitutional: Negative.  Negative for fever and unexpected weight change.  HENT: Negative.  Negative for congestion, dental problem, ear pain, nosebleeds, postnasal drip, rhinorrhea, sinus pressure, sneezing, sore throat and trouble swallowing.   Eyes: Negative.  Negative for redness and itching.  Respiratory: Negative for cough, chest tightness, shortness of breath and wheezing.   Cardiovascular: Negative.  Negative for palpitations and leg swelling.  Gastrointestinal: Negative.  Negative for nausea and vomiting.  Genitourinary: Negative.  Negative for dysuria.  Musculoskeletal: Negative.  Negative for joint swelling.  Skin: Negative for rash.  Neurological: Negative.  Negative for headaches.  Hematological: Negative.  Does not bruise/bleed easily.  Psychiatric/Behavioral: Negative.  Negative for dysphoric mood. The patient is not nervous/anxious.     Past Medical History:  Diagnosis Date  . Arthritis    "knees" (02/17/2017)  . 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 Depression    . GERD (gastroesophageal reflux disease)   . Hypertension   . Pneumonia ~ 2017  . Sarcoidosis, lung ([facility])   . Stroke ([facility]) 05/09/2015   "vs TIA; mild"   . TIA (transient ischemic attack) 05/09/2015   "vs CVA; denies residual on 02/17/2017)     Family History  Problem Relation Age of Onset  . Hypertension Mother   . Colon cancer Mother   . Hypertension Father   . Hypertension Brother      Social History   Socioeconomic History  . Marital status: Married    Spouse name: Not on file  . Number of children: Not on file  . Years of education: Not on file  . Highest education level: Not on file  Occupational History  . Not on file  Tobacco Use  . Smoking status: Never Smoker  . Smokeless tobacco: Never Used  Substance and Sexual Activity  . Alcohol use: No  . Drug use: No  . Sexual activity: Yes  Other Topics Concern  . Not on file  Social History Narrative  . Not on file   Social Determinants of Health   Financial Resource Strain:   . Difficulty of Paying Living Expenses: Not on file  Food Insecurity:   . Worried About Charity fundraiser in the Last Year: Not on file  . Ran Out of Food in the Last Year: Not on file  Transportation Needs:   . Lack of Transportation (Medical): Not on file  . Lack of Transportation (Non-Medical): Not on file  Physical Activity:   . Days of Exercise  per Week: Not on file  . Minutes of Exercise per Session: Not on file  Stress:   . Feeling of Stress : Not on file  Social Connections:   . Frequency of Communication with Friends and Family: Not on file  . Frequency of Social Gatherings with Friends and Family: Not on file  . Attends Religious Services: Not on file  . Active Member of Clubs or Organizations: Not on file  . Attends Banker Meetings: Not on file  . Marital Status: Not on file  Intimate Partner Violence:   . Fear of Current or Ex-Partner: Not on file  . Emotionally Abused: Not on file  . Physically  Abused: Not on file  . Sexually Abused: Not on file  lives in [place], originally from [place].  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   No Known Allergies   Outpatient Medications Prior to Visit  Medication Sig Dispense Refill  . albuterol (VENTOLIN HFA) 108 (90 Base) MCG/ACT inhaler Inhale 1-2 puffs into the lungs every 6 (six) hours as needed for wheezing or shortness of breath. 1 Inhaler 0  . amLODipine (NORVASC) 10 MG tablet Take 1 tablet (10 mg total) by mouth daily. 30 tablet 0  . aspirin EC 325 MG EC tablet Take 1 tablet (325 mg total) by mouth daily. 30 tablet 0  . benzonatate (TESSALON) 200 MG capsule Take 1 capsule (200 mg total) by mouth 3 (three) times daily as needed for cough. 30 capsule 1  . ferrous sulfate 325 (65 FE) MG tablet Take 325 mg by mouth daily with breakfast.    . hydrALAZINE (APRESOLINE) 25 MG tablet Take 25 mg by mouth 2 (two) times daily.    . hydrochlorothiazide (HYDRODIURIL) 25 MG tablet Take 1 tablet (25 mg total) by mouth daily. 30 tablet 0  . omeprazole (PRILOSEC) 40 MG capsule Take 40 mg by mouth daily.   0  . topiramate (TOPAMAX) 25 MG tablet TK 4 TS PO HS    . triamcinolone cream (KENALOG) 0.1 % Apply 1 application topically daily. Dry skin  0  . valsartan (DIOVAN) 160 MG tablet Take 1 tablet (160 mg total) by mouth daily. 30 tablet 5  . dicyclomine (BENTYL) 20 MG tablet Take 20 mg by mouth every 6 (six) hours as needed for spasms.    . traMADol (ULTRAM) 50 MG tablet Take 1 tablet (50 mg total) by mouth every 6 (six) hours as needed. 15 tablet 0   No facility-administered medications prior to visit.        Objective:   Physical Exam Vitals:   10/08/19 1117  BP: 124/82  Pulse: 66  Temp: (!) 97.3 F (36.3 C)  TempSrc: Temporal  SpO2: 97%  Weight: 177 lb 3.2 oz (80.4 kg)  Height: 5\' 4"  (1.626 m)   Gen: Pleasant, well-nourished, in no distress,  normal affect  ENT: No lesions,  mouth clear,  oropharynx clear, no postnasal  drip  Neck: No JVD, no stridor  Lungs: No use of accessory muscles, clear without rales or rhonchi, no wheeze on a forced exp  Cardiovascular: RRR, heart sounds normal, no murmur or gallops, no peripheral edema  Musculoskeletal: No deformities, no cyanosis or clubbing  Neuro: alert, non focal  Skin: no rash on the arms or face     Assessment & Plan:  Sarcoidosis (HCC) Quiet at this time. No resp sx and no rash.  She was rightly concerned about her potential risk for problems if  she were to get COVID-19.  Given that her sarcoidosis is quiesced sent I think that it is reasonable for her to keep her same job, continue good hand hygiene and isolation practices.  I am glad that your sarcoid is quiet at this time. Please continue to practice your personal protective equipment, mask, gloves, diligent handwashing and social distancing as you do your job. Keep your albuterol available to use 2 puffs if needed for shortness of breath, chest tightness, wheezing. You are due for an ophthalmology exam at some point this year. You would be a good candidate for the COVID-19 vaccine. We will repeat a high-resolution CT scan of the chest at the end of this year to compare with your priors. We will repeat your pulmonary function testing at the end of this year as well. Please call our office if you develop any changes in breathing, coughing, new rash. Follow with Dr. Delton Coombes in December to review CT scan of the chest and pulmonary function testing.  Levy Pupa, MD, PhD 10/08/2019, 11:53 AM [facility] [phone] or if no answer [phone]

## 2019-10-08 NOTE — Assessment & Plan Note (Signed)
Quiet at this time. No resp sx and no rash.  She was rightly concerned about her potential risk for problems if she were to get COVID-19.  Given that her sarcoidosis is quiesced sent I think that it is reasonable for her to keep her same job, continue good hand hygiene and isolation practices.  I am glad that your sarcoid is quiet at this time. Please continue to practice your personal protective equipment, mask, gloves, diligent handwashing and social distancing as you do your job. Keep your albuterol available to use 2 puffs if needed for shortness of breath, chest tightness, wheezing. You are due for an ophthalmology exam at some point this year. You would be a good candidate for the COVID-19 vaccine. We will repeat a high-resolution CT scan of the chest at the end of this year to compare with your priors. We will repeat your pulmonary function testing at the end of this year as well. Please call our office if you develop any changes in breathing, coughing, new rash. Follow with Dr. Delton Coombes in December to review CT scan of the chest and pulmonary function testing.

## 2020-08-26 ENCOUNTER — Encounter (HOSPITAL_COMMUNITY): Payer: Self-pay | Admitting: Emergency Medicine

## 2020-08-26 ENCOUNTER — Other Ambulatory Visit: Payer: Self-pay

## 2020-08-26 ENCOUNTER — Ambulatory Visit (HOSPITAL_COMMUNITY)
Admission: EM | Admit: 2020-08-26 | Discharge: 2020-08-26 | Disposition: A | Discharge status: Other Acute Inpatient Hospital | Payer: Self-pay

## 2020-08-26 ENCOUNTER — Emergency Department (HOSPITAL_COMMUNITY)
Admission: EM | Admit: 2020-08-26 | Discharge: 2020-08-27 | Disposition: A | Discharge status: Home / Self Care | Payer: Self-pay | Attending: Emergency Medicine | Admitting: Emergency Medicine

## 2020-08-26 ENCOUNTER — Emergency Department (HOSPITAL_COMMUNITY): Payer: Self-pay

## 2020-08-26 DIAGNOSIS — Z7982 Long term (current) use of aspirin: Secondary | ICD-10-CM | POA: Insufficient documentation

## 2020-08-26 DIAGNOSIS — Z8673 Personal history of transient ischemic attack (TIA), and cerebral infarction without residual deficits: Secondary | ICD-10-CM | POA: Insufficient documentation

## 2020-08-26 DIAGNOSIS — I10 Essential (primary) hypertension: Secondary | ICD-10-CM | POA: Insufficient documentation

## 2020-08-26 DIAGNOSIS — Z79899 Other long term (current) drug therapy: Secondary | ICD-10-CM | POA: Insufficient documentation

## 2020-08-26 LAB — DIFFERENTIAL
Abs Immature Granulocytes: 0.01 10*3/uL (ref 0.00–0.07)
Basophils Absolute: 0 10*3/uL (ref 0.0–0.1)
Basophils Relative: 0 %
Eosinophils Absolute: 0.1 10*3/uL (ref 0.0–0.5)
Eosinophils Relative: 2 %
Immature Granulocytes: 0 %
Lymphocytes Relative: 22 %
Lymphs Abs: 1.6 10*3/uL (ref 0.7–4.0)
Monocytes Absolute: 0.5 10*3/uL (ref 0.1–1.0)
Monocytes Relative: 8 %
Neutro Abs: 4.9 10*3/uL (ref 1.7–7.7)
Neutrophils Relative %: 68 %

## 2020-08-26 LAB — COMPREHENSIVE METABOLIC PANEL
ALT: 16 U/L (ref 0–44)
AST: 13 U/L — ABNORMAL LOW (ref 15–41)
Albumin: 3.5 g/dL (ref 3.5–5.0)
Alkaline Phosphatase: 74 U/L (ref 38–126)
Anion gap: 8 (ref 5–15)
BUN: 17 mg/dL (ref 6–20)
CO2: 27 mmol/L (ref 22–32)
Calcium: 9.9 mg/dL (ref 8.9–10.3)
Chloride: 105 mmol/L (ref 98–111)
Creatinine, Ser: 1.13 mg/dL — ABNORMAL HIGH (ref 0.44–1.00)
GFR, Estimated: 58 mL/min — ABNORMAL LOW (ref >60)
Glucose, Bld: 87 mg/dL (ref 70–99)
Potassium: 4.2 mmol/L (ref 3.5–5.1)
Sodium: 140 mmol/L (ref 135–145)
Total Bilirubin: 0.5 mg/dL (ref 0.3–1.2)
Total Protein: 6.6 g/dL (ref 6.5–8.1)

## 2020-08-26 LAB — PROTIME-INR
INR: 1 (ref 0.8–1.2)
Prothrombin Time: 12.9 seconds (ref 11.4–15.2)

## 2020-08-26 LAB — CBC
HCT: 46.5 % — ABNORMAL HIGH (ref 36.0–46.0)
Hemoglobin: 14.2 g/dL (ref 12.0–15.0)
MCH: 26.2 pg (ref 26.0–34.0)
MCHC: 30.5 g/dL (ref 30.0–36.0)
MCV: 85.6 fL (ref 80.0–100.0)
Platelets: 152 10*3/uL (ref 150–400)
RBC: 5.43 MIL/uL — ABNORMAL HIGH (ref 3.87–5.11)
RDW: 14.9 % (ref 11.5–15.5)
WBC: 7.2 10*3/uL (ref 4.0–10.5)
nRBC: 0 % (ref 0.0–0.2)

## 2020-08-26 LAB — I-STAT BETA HCG BLOOD, ED (MC, WL, AP ONLY): I-stat hCG, quantitative: <5 m[IU]/mL (ref <5)

## 2020-08-26 LAB — APTT: aPTT: 31 seconds (ref 24–36)

## 2020-08-26 MED ORDER — SODIUM CHLORIDE 0.9% FLUSH
3.0000 mL | Freq: Once | INTRAVENOUS | Status: DC
Start: 2020-08-26 — End: 2020-08-27

## 2020-08-26 NOTE — ED Triage Notes (Signed)
Pt reports hypertension x 3 days.  Reports headache, dizziness, and tingling to R fingertips since 10am.  Initially pt had slight R sided arm drift but when assessed again she did not.  Dr. Audley Hose (EDP) called to triage to assess pt and no Code Stroke activation at this time.

## 2020-08-26 NOTE — ED Notes (Signed)
Pt reported HTN and feeling dizzy. Vitals BP 200/105 ,HR 77, SPO2 100% , Pt reports feeling dizzy . Pt has not had her BP meds in several months.This was reported to [facility] and Pt was sent to [facility]  With family for eval and treatment

## 2020-08-27 ENCOUNTER — Emergency Department (HOSPITAL_COMMUNITY): Payer: Self-pay

## 2020-08-27 MED ORDER — IRBESARTAN 75 MG PO TABS
75.0000 mg | ORAL_TABLET | Freq: Every day | ORAL | Status: DC
  Filled 2020-08-27: qty 1

## 2020-08-27 MED ORDER — LORAZEPAM 2 MG/ML IJ SOLN: 1.0000 mg | Freq: Once | INTRAMUSCULAR | Status: DC | PRN

## 2020-08-27 MED ORDER — HYDROCHLOROTHIAZIDE 25 MG PO TABS: 25.0000 mg | ORAL_TABLET | Freq: Every day | ORAL | 1 refills | Status: AC

## 2020-08-27 MED ORDER — VALSARTAN 160 MG PO TABS: 160.0000 mg | ORAL_TABLET | Freq: Every day | ORAL | 1 refills | Status: AC

## 2020-08-27 MED ORDER — AMLODIPINE BESYLATE 5 MG PO TABS
5.0000 mg | ORAL_TABLET | Freq: Once | ORAL | Status: AC
  Administered 2020-08-27: 5 mg via ORAL
  Filled 2020-08-27: qty 1

## 2020-08-27 MED ORDER — METOCLOPRAMIDE HCL 5 MG/ML IJ SOLN
10.0000 mg | Freq: Once | INTRAMUSCULAR | Status: AC
  Administered 2020-08-27: 10 mg via INTRAVENOUS
  Filled 2020-08-27: qty 2

## 2020-08-27 MED ORDER — DEXAMETHASONE SODIUM PHOSPHATE 10 MG/ML IJ SOLN
10.0000 mg | Freq: Once | INTRAMUSCULAR | Status: AC
  Administered 2020-08-27: 10 mg via INTRAVENOUS
  Filled 2020-08-27: qty 1

## 2020-08-27 MED ORDER — DIPHENHYDRAMINE HCL 50 MG/ML IJ SOLN
25.0000 mg | Freq: Once | INTRAMUSCULAR | Status: AC
  Administered 2020-08-27: 25 mg via INTRAVENOUS
  Filled 2020-08-27: qty 1

## 2020-08-27 MED ORDER — HYDRALAZINE HCL 25 MG PO TABS
25.0000 mg | ORAL_TABLET | Freq: Once | ORAL | Status: AC
  Administered 2020-08-27: 25 mg via ORAL
  Filled 2020-08-27: qty 1

## 2020-08-27 MED ORDER — HYDROCHLOROTHIAZIDE 25 MG PO TABS
25.0000 mg | ORAL_TABLET | Freq: Every day | ORAL | Status: DC
  Administered 2020-08-27: 25 mg via ORAL
  Filled 2020-08-27: qty 1

## 2020-08-27 MED ORDER — HYDRALAZINE HCL 25 MG PO TABS
25.0000 mg | ORAL_TABLET | Freq: Two times a day (BID) | ORAL | 1 refills | Status: DC
Start: 2020-08-27 — End: 2024-05-06

## 2020-08-27 MED ORDER — AMLODIPINE BESYLATE 10 MG PO TABS: 10.0000 mg | ORAL_TABLET | Freq: Every day | ORAL | 1 refills | Status: AC

## 2020-08-27 NOTE — ED Provider Notes (Signed)
[facility] EMERGENCY DEPARTMENT Provider Note   CSN: [id number] Arrival date & time: 08/26/20  1250     History Chief Complaint  Patient presents with  . Hypertension  . Headache  . Dizziness  . Numbness    Hannah Ellis is a 54 y.o. female.  54 year old female with history of hypertension not on medications other medical problems documented below who presents to the emergency department today with multiple complaints.  Patient states that she has been out of her medications for last couple weeks that she lost her insurance and is in the process of trying to get new insurance.  Over the last 2 to 3 days she has noticed worsening hypertension and now she is having headaches with it too.  She states that it is frontal and bilateral occipital areas.  She states that she had some tingling in her right fingertips when she arrived here but no weakness.  She states that she has had no vision changes.  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  She has not noticed any chest pain, shortness of breath, leg swelling, decreased urination or other issues.  She states that she was on for blood pressure medications and as of now she is on 0.  She does have a history of headaches but not usually with her blood pressure being elevated.   Hypertension Associated symptoms include headaches.  Headache Associated symptoms: dizziness   Dizziness Associated symptoms: headaches        Past Medical History:  Diagnosis Date  . Arthritis    "knees" (02/17/2017)  . 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 Depression   . GERD (gastroesophageal reflux disease)   . Hypertension   . Pneumonia ~ 2017  . Sarcoidosis, lung (H[facility])   . Stroke ([facility]) 05/09/2015   "vs TIA; mild"   . TIA (transient ischemic attack) 05/09/2015   "vs CVA; denies residual on 02/17/2017)    Patient Active Problem List   Diagnosis Date Noted  . Hypokalemia   . Abdominal pain 02/17/2017  . Pyelonephritis   .  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 Sarcoidosis 10/15/2016  . Encephalopathy, hypertensive 07/11/2015  . Anxiety state 07/11/2015  . Essential hypertension 07/11/2015  . TIA (transient ischemic attack) 05/09/2015  . Uncontrolled hypertension 05/09/2015  . Patient nonadherence 05/09/2015  . Microcytic anemia 05/09/2015    Past Surgical History:  Procedure Laterality Date  . APPENDECTOMY    . CESAREAN SECTION  1998  . EYE SURGERY Left ~ 1972  . FOOT SURGERY Right    "took some bone out of pinky and one next to it"  . TUBAL LIGATION  1998     OB History   No obstetric history on file.     Family History  Problem Relation Age of Onset  . Hypertension Mother   . Colon cancer Mother   . Hypertension Father   . Hypertension Brother     Social History   Tobacco Use  . Smoking status: Never Smoker  . Smokeless tobacco: Never Used  Vaping Use  . Vaping Use: Never used  Substance Use Topics  . Alcohol use: No  . Drug use: No    Home Medications Prior to Admission medications   Medication Sig Start Date End Date Taking? Authorizing Provider  albuterol (VENTOLIN HFA) 108 (90 Base) MCG/ACT inhaler Inhale 1-2 puffs into the lungs every 6 (six) hours as needed for wheezing or shortness of breath. 03/01/19  Ivonne AndrewNichols, Tonya S, NP  amLODipine (NORVASC) 10 MG tablet Take 1 tablet (10 mg total) by mouth daily. 08/27/20   Joshva Labreck, Barbara CowerJason, MD  aspirin EC 325 MG EC tablet Take 1 tablet (325 mg total) by mouth daily. 05/11/15   Joseph ArtVann, Jessica U, DO  benzonatate (TESSALON) 200 MG capsule Take 1 capsule (200 mg total) by mouth 3 (three) times daily as needed for cough. 03/01/19   Ivonne AndrewNichols, Tonya S, NP  ferrous sulfate 325 (65 FE) MG tablet Take 325 mg by mouth daily with breakfast.    [provider]  hydrALAZINE (APRESOLINE) 25 MG tablet Take 1 tablet (25 mg total) by mouth 2 (two) times daily. 08/27/20   Najib Colmenares, Barbara CowerJason, MD  hydrochlorothiazide (HYDRODIURIL) 25 MG tablet Take 1 tablet (25 mg  total) by mouth daily. 08/27/20   Laurence Crofford, Barbara CowerJason, MD  omeprazole (PRILOSEC) 40 MG capsule Take 40 mg by mouth daily.  10/04/16   [provider]  topiramate (TOPAMAX) 25 MG tablet TK 4 TS PO HS 01/05/19   [provider]  triamcinolone cream (KENALOG) 0.1 % Apply 1 application topically daily. Dry skin 05/12/17   [provider]  valsartan (DIOVAN) 160 MG tablet Take 1 tablet (160 mg total) by mouth daily. 08/27/20   Jyllian Haynie, Barbara CowerJason, MD    Allergies    Patient has no known allergies.  Review of Systems   Review of Systems  Neurological: Positive for dizziness and headaches.  All other systems reviewed and are negative.   Physical Exam Updated Vital Signs BP (!) 147/95 (BP Location: Left Arm)   Pulse 76   Temp 98.2 F (36.8 C) (Oral)   Resp 17   SpO2 96%   Physical Exam Vitals and nursing note reviewed.  Constitutional:      Appearance: She is well-developed.  HENT:     Head: Normocephalic and atraumatic.     Mouth/Throat:     Mouth: Mucous membranes are moist.     Pharynx: Oropharynx is clear.  Eyes:     Pupils: Pupils are equal, round, and reactive to light.  Cardiovascular:     Rate and Rhythm: Normal rate and regular rhythm.     Pulses: Normal pulses.     Heart sounds: No murmur heard.   Pulmonary:     Effort: No respiratory distress.     Breath sounds: No stridor.  Abdominal:     General: There is no distension.     Palpations: Abdomen is soft.  Musculoskeletal:        General: No swelling or tenderness. Normal range of motion.     Cervical back: Normal range of motion.  Skin:    General: Skin is warm and dry.  Neurological:     General: No focal deficit present.     Mental Status: She is alert.     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  Her extraocular movements are intact otherwise.  Her pupils are equal round and reactive bilaterally.  She has good eyebrow raise, good  mouth opening, good tongue protrusion, symmetric sensation to light touch him all 3 areas of her face.  She has good grip strength, good shoulder shrug strength, good sensation to light touch bilaterally upper extremities.  She has good dorsiflexion, plantarflexion, leg left and Achilles reflexes that are symmetric in bilateral lower extremities.     ED Results / Procedures / Treatments   Labs (all labs ordered  are listed, but only abnormal results are displayed) Labs Reviewed  CBC - Abnormal; Notable for the following components:      Result Value   RBC 5.43 (*)    HCT 46.5 (*)    All other components within normal limits  COMPREHENSIVE METABOLIC PANEL - Abnormal; Notable for the following components:   Creatinine, Ser 1.13 (*)    AST 13 (*)    GFR, Estimated 58 (*)    All other components within normal limits  PROTIME-INR  APTT  DIFFERENTIAL  I-STAT BETA HCG BLOOD, ED ([facility], [facility], [facility] ONLY)    EKG EKG Interpretation  Date/Time:  Saturday August 26 2020 13:11:10 EST Ventricular Rate:  68 PR Interval:  172 QRS Duration: 102 QT Interval:  422 QTC Calculation: 448 R Axis:   -45 Text Interpretation: Normal sinus rhythm Left anterior fascicular block Abnormal ECG No significant change since last tracing Confirmed by Marily Memos [phone]) on 08/26/2020 11:39:59 PM   Radiology CT HEAD WO CONTRAST  Result Date: 08/26/2020 CLINICAL DATA:  Headache with dizziness and hypertension. EXAM: CT HEAD WITHOUT CONTRAST TECHNIQUE: Contiguous axial images were obtained from the base of the skull through the vertex without intravenous contrast. COMPARISON:  MRI brain 11/07/2016.  Head CT 05/09/2015. FINDINGS: Brain: There is no evidence for acute hemorrhage, hydrocephalus, mass lesion, or abnormal extra-axial fluid collection. No definite CT evidence for acute infarction. Vascular: No hyperdense vessel or unexpected calcification. Skull: No evidence for fracture. No worrisome lytic or sclerotic  lesion. Sinuses/Orbits: The visualized paranasal sinuses and mastoid air cells are clear. Abnormal configuration of the left globe is stable. Other: None. IMPRESSION: Stable.  No acute intracranial abnormality. Electronically Signed   By: Kennith Center M.D.   On: 08/26/2020 14:50   MR BRAIN WO CONTRAST  Result Date: 08/27/2020 CLINICAL DATA:  Headache with tingling of the right fingers EXAM: MRI HEAD WITHOUT CONTRAST TECHNIQUE: Multiplanar, multiecho pulse sequences of the brain and surrounding structures were obtained without intravenous contrast. COMPARISON:  None. FINDINGS: Brain: No acute infarct, mass effect or extra-axial collection. No acute or chronic hemorrhage. There is multifocal hyperintense T2-weighted signal within the white matter. Parenchymal volume and CSF spaces are normal. The midline structures are normal. Vascular: Major flow voids are preserved. Skull and upper cervical spine: Normal calvarium and skull base. Visualized upper cervical spine and soft tissues are normal. Sinuses/Orbits:No paranasal sinus fluid levels or advanced mucosal thickening. No mastoid or middle ear effusion. Normal orbits. IMPRESSION: 1. No acute intracranial abnormality. 2. Findings of chronic microvascular ischemia. Electronically Signed   By: Deatra Robinson M.D.   On: 08/27/2020 04:42    Procedures Procedures (including critical care time)  Medications Ordered in ED Medications  sodium chloride flush (NS) 0.9 % injection 3 mL (3 mLs Intravenous Not Given 08/27/20 0012)  hydrochlorothiazide (HYDRODIURIL) tablet 25 mg (25 mg Oral Given 08/27/20 0519)  metoCLOPramide (REGLAN) injection 10 mg (10 mg Intravenous Given 08/27/20 0140)  diphenhydrAMINE (BENADRYL) injection 25 mg (25 mg Intravenous Given 08/27/20 0139)  dexamethasone (DECADRON) injection 10 mg (10 mg Intravenous Given 08/27/20 0139)  amLODipine (NORVASC) tablet 5 mg (5 mg Oral Given 08/27/20 0134)  hydrALAZINE (APRESOLINE) tablet 25 mg (25 mg Oral  Given 08/27/20 0134)  amLODipine (NORVASC) tablet 5 mg (5 mg Oral Given 08/27/20 7253)    ED Course  I have reviewed the triage vital signs and the nursing notes.  Pertinent labs & imaging results that were available during my care of the patient were  reviewed by me and considered in my medical decision making (see chart for details).    MDM Rules/Calculators/A&P                         Patient with headache and hypertension.  CT scan negative.  No signs of stroke.  Nursing note says some but some arm drift that was intermittent however is not noticed on my exam and I did think it be unlikely for her to have an acute CVA with that.  We will forego doing MRI just treat her headache and blood pressure at this time. Patient's headache seemed to improve a little bit but her blood pressure significant elevated we will going do an MRI just to rule out stroke.  MRI was negative.  Headache is improved.  Blood pressures of significant improved.  Will refill her medications and she will follow-up with her PCP for further prescriptions.  Final Clinical Impression(s) / ED Diagnoses Final diagnoses:  Hypertension, unspecified type    Rx / DC Orders ED Discharge Orders         Ordered    amLODipine (NORVASC) 10 MG tablet  Daily        08/27/20 0533    hydrochlorothiazide (HYDRODIURIL) 25 MG tablet  Daily        08/27/20 0533    valsartan (DIOVAN) 160 MG tablet  Daily        08/27/20 0533    hydrALAZINE (APRESOLINE) 25 MG tablet  2 times daily        08/27/20 0533           Lynnell Fiumara, Barbara Cower, MD 08/27/20 [id number]

## 2020-08-29 ENCOUNTER — Ambulatory Visit (INDEPENDENT_AMBULATORY_CARE_PROVIDER_SITE_OTHER)
Admission: RE | Admit: 2020-08-29 | Discharge: 2020-08-29 | Disposition: A | Discharge status: Home / Self Care | Payer: Self-pay | Source: Ambulatory Visit | Attending: Emergency Medicine | Admitting: Emergency Medicine

## 2020-08-29 ENCOUNTER — Other Ambulatory Visit: Payer: Self-pay

## 2020-08-29 DIAGNOSIS — D869 Sarcoidosis, unspecified: Secondary | ICD-10-CM

## 2020-09-05 ENCOUNTER — Ambulatory Visit (INDEPENDENT_AMBULATORY_CARE_PROVIDER_SITE_OTHER): Payer: Self-pay | Admitting: Emergency Medicine

## 2020-09-05 ENCOUNTER — Encounter: Payer: Self-pay | Admitting: Emergency Medicine

## 2020-09-05 ENCOUNTER — Other Ambulatory Visit: Payer: Self-pay

## 2020-09-05 DIAGNOSIS — D869 Sarcoidosis, unspecified: Secondary | ICD-10-CM

## 2020-09-05 MED ORDER — SULFAMETHOXAZOLE-TRIMETHOPRIM 800-160 MG PO TABS: ORAL_TABLET | ORAL | 0 refills | Status: DC

## 2020-09-05 MED ORDER — PREDNISONE 10 MG PO TABS: 30.0000 mg | ORAL_TABLET | Freq: Every day | ORAL | 0 refills | Status: DC

## 2020-09-05 NOTE — Patient Instructions (Signed)
Please take prednisone 30 mg daily for the next 3 weeks.  Then stop. Please take Bactrim DS, 1 tablet 3 times a week (MWF) while you are on the prednisone. We will plan to repeat your CT scan of the chest in 1 month to look for improvement in your lung inflammation. Follow with Dr Delton Coombes in 1 month or next available after your CT chest so that we can review the results together.

## 2020-09-05 NOTE — Progress Notes (Signed)
Subjective:    Patient ID: Hannah Ellis, female    DOB: [birth year], 54 y.o.   MRN: [id number]  HPI  ROV 10/08/19 --Hannah Ellis is 54, follows up today for her history of pulmonary and cutaneous sarcoidosis.  She has associated mild obstruction on spirometry.  Also with a right upper lobe peribronchovascular reticular opacity on CT scan of the chest, last was done 08/29/2017. A CXR 03/01/19 reviewed by me shows chronic interstitial changes especially in R perihilar region. She has had chronic cough, currently not bothersome. 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  She denies any breathing limitations. No cough. She does feel dehydrated after work - the heat is up high there. No CP. She is not having rash right now. She has not been requiring her albuterol.  She is due for an optho exam.   ROV 09/05/20 --54 year old woman with a history of pulmonary and cutaneous sarcoidosis, mild obstructive lung disease, chronic cough. She has a right upper lobe peribronchovascular reticular opacity that has been followed on CT scan. We repeated a CT chest 08/30/2020 and I have reviewed, this shows significant increase in upper lobe dominant clustered perilymphatic nodularity when compared with 08/29/2017, no overt evidence for fibrotic change. No significant mediastinal adenopathy. She denies any dyspnea, CP, cough, wheeze. She was in the ED recently with HA and significant HTN - she had run out of her meds for several months.    Review of Systems  Constitutional: Negative.  Negative for fever and unexpected weight change.  HENT: Negative.  Negative for congestion, dental problem, ear pain, nosebleeds, postnasal drip, rhinorrhea, sinus pressure, sneezing, sore throat and trouble swallowing.   Eyes: Negative.  Negative for redness and itching.  Respiratory: Negative for cough, chest tightness, shortness of  breath and wheezing.   Cardiovascular: Negative.  Negative for palpitations and leg swelling.  Gastrointestinal: Negative.  Negative for nausea and vomiting.  Genitourinary: Negative.  Negative for dysuria.  Musculoskeletal: Negative.  Negative for joint swelling.  Skin: Negative for rash.  Neurological: Negative.  Negative for headaches.  Hematological: Negative.  Does not bruise/bleed easily.  Psychiatric/Behavioral: Negative.  Negative for dysphoric mood. The patient is not nervous/anxious.     Past Medical History:  Diagnosis Date  . Arthritis    "knees" (02/17/2017)  . 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 Depression   . GERD (gastroesophageal reflux disease)   . Hypertension   . Pneumonia ~ 2017  . Sarcoidosis, lung (HCC)   . Stroke (HCC) 05/09/2015   "vs TIA; mild"   . TIA (transient ischemic attack) 05/09/2015   "vs CVA; denies residual on 02/17/2017)     Family History  Problem Relation Age of Onset  . Hypertension Mother   . Colon cancer Mother   . Hypertension Father   . Hypertension Brother      Social History   Socioeconomic History  . Marital status: Married    Spouse name: Not on file  . Number of children: Not on file  . Years of education: Not on file  . Highest education level: Not on file  Occupational History  . Not on file  Tobacco Use  . Smoking status: Never Smoker  . Smokeless tobacco: Never Used  Vaping Use  . Vaping Use: Never used  Substance and Sexual Activity  . Alcohol use: No  . Drug use:  No  . Sexual activity: Yes  Other Topics Concern  . Not on file  Social History Narrative  . Not on file   Social Determinants of Health   Financial Resource Strain: Not on file  Food Insecurity: Not on file  Transportation Needs: Not on file  Physical Activity: Not on file  Stress: Not on file  Social Connections: Not on file  Intimate Partner Violence: Not on file  lives in [place], originally from [place].  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   No Known Allergies   Outpatient Medications Prior to Visit  Medication Sig Dispense Refill  . albuterol (VENTOLIN HFA) 108 (90 Base) MCG/ACT inhaler Inhale 1-2 puffs into the lungs every 6 (six) hours as needed for wheezing or shortness of breath. 1 Inhaler 0  . amLODipine (NORVASC) 10 MG tablet Take 1 tablet (10 mg total) by mouth daily. 30 tablet 1  . aspirin EC 325 MG EC tablet Take 1 tablet (325 mg total) by mouth daily. 30 tablet 0  . ferrous sulfate 325 (65 FE) MG tablet Take 325 mg by mouth daily with breakfast.    . hydrALAZINE (APRESOLINE) 25 MG tablet Take 1 tablet (25 mg total) by mouth 2 (two) times daily. 60 tablet 1  . hydrochlorothiazide (HYDRODIURIL) 25 MG tablet Take 1 tablet (25 mg total) by mouth daily. 30 tablet 1  . omeprazole (PRILOSEC) 40 MG capsule Take 40 mg by mouth daily.  0  . topiramate (TOPAMAX) 25 MG tablet TK 4 TS PO HS    . triamcinolone cream (KENALOG) 0.1 % Apply 1 application topically daily. Dry skin  0  . valsartan (DIOVAN) 160 MG tablet Take 1 tablet (160 mg total) by mouth daily. 30 tablet 1  . benzonatate (TESSALON) 200 MG capsule Take 1 capsule (200 mg total) by mouth 3 (three) times daily as needed for cough. (Patient not taking: Reported on 09/05/2020) 30 capsule 1   No facility-administered medications prior to visit.        Objective:   Physical Exam Vitals:   09/05/20 1626  BP: 134/82  Pulse: 83  Temp: (!) 97.3 F (36.3 C)  SpO2: 98%  Weight: 180 lb 9.6 oz (81.9 kg)  Height: 5\' 4"  (1.626 m)   Gen: Pleasant, well-nourished, in no distress,  normal affect  ENT: No lesions,  mouth clear,  oropharynx clear, no postnasal drip  Neck: No JVD, no stridor  Lungs: No use of accessory muscles, clear without rales or rhonchi, no wheeze on a forced exp  Cardiovascular: RRR, heart sounds normal, no murmur or gallops, no peripheral edema  Musculoskeletal: No deformities, no cyanosis or clubbing  Neuro: alert,  non focal  Skin: no rash on the arms or face     Assessment & Plan:  Sarcoidosis (HCC) She is asymptomatic but her CT chest shows significant increase in bilateral upper lobe predominant clustered perilymphatic nodularity.  This is likely her sarcoidosis.  She is not showing any infectious signs or symptoms.  Discussed with her the possible options including bronchoscopy with BAL, biopsies to ensure no other etiology.  In the end we have decided to treat with prednisone course for a sarcoid flare to see how the inflammatory changes resolve (they have done so in the past).  Repeat her CT after course of therapy.  If there is residual inflammatory change, infiltrate then we will discuss bronchoscopy  , MD, PhD 09/05/2020, 4:57 PM [facility] [phone]  or if no answer [phone]

## 2020-09-05 NOTE — Assessment & Plan Note (Signed)
She is asymptomatic but her CT chest shows significant increase in bilateral upper lobe predominant clustered perilymphatic nodularity.  This is likely her sarcoidosis.  She is not showing any infectious signs or symptoms.  Discussed with her the possible options including bronchoscopy with BAL, biopsies to ensure no other etiology.  In the end we have decided to treat with prednisone course for a sarcoid flare to see how the inflammatory changes resolve (they have done so in the past).  Repeat her CT after course of therapy.  If there is residual inflammatory change, infiltrate then we will discuss bronchoscopy

## 2020-09-07 ENCOUNTER — Telehealth: Payer: Self-pay | Admitting: Emergency Medicine

## 2020-09-07 NOTE — Telephone Encounter (Signed)
I can't think of any reason that adding prednisone should give her rib pain.  If she would like to stop the pred for a couple days to see if the pain improves then she can. Have her let us know the response so we can decide whether to restart the pred.

## 2020-09-07 NOTE — Telephone Encounter (Signed)
Called and spoke to pt. Pt c/o BIL rib pain that began yesterday 12.15.21. 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 Pt states the pain doesn't fully resolve but at its worst discomfort she rates the pain a 6 out of a 10. Pt describes the pain as an achy pain. Pt states she has not experienced this type of discomfort before in her ribs. Pt questioning if it is related to the new medication that was prescribed on 12.14.21 during OV with Dr. Delton Coombes. Pt denies any new onset of s/s. Pt denies SOB, CP/tightness, f/c/s, swelling. Pt requesting recs today.   Dr. Delton Coombes, please advise. Thank you.

## 2020-09-07 NOTE — Telephone Encounter (Signed)
Spoke with pt and advised of Dr Kavin Leech recommendations. PT states she is going to stay on the Prednisone for now and see how she does. Advised pt to call us back in any further questions or concerns. Pt verbalized understanding. Nothing further needed.

## 2020-09-20 ENCOUNTER — Ambulatory Visit: Payer: BC Managed Care – PPO | Admitting: Emergency Medicine

## 2020-10-06 ENCOUNTER — Other Ambulatory Visit: Payer: Self-pay

## 2020-10-06 ENCOUNTER — Telehealth: Payer: Self-pay | Admitting: Emergency Medicine

## 2020-10-06 ENCOUNTER — Ambulatory Visit (INDEPENDENT_AMBULATORY_CARE_PROVIDER_SITE_OTHER)
Admission: RE | Admit: 2020-10-06 | Discharge: 2020-10-06 | Disposition: A | Discharge status: Home / Self Care | Payer: BC Managed Care – PPO | Source: Ambulatory Visit | Attending: Emergency Medicine | Admitting: Emergency Medicine

## 2020-10-06 DIAGNOSIS — D869 Sarcoidosis, unspecified: Secondary | ICD-10-CM

## 2020-10-06 NOTE — Telephone Encounter (Signed)
Hannah Ellis is working on this

## 2020-10-06 NOTE — Telephone Encounter (Signed)
[facility] needs a PA for the CT Chest w/o contrast - pt is in the office NOW - pt originally presented as self - pay and when they asked her she said no - I have [employer] - [facility] entered the insurance in her chart but again, states that pt is there now for the CT chest w/o contrast - [place] direct line is [phone] - please advise

## 2020-10-11 NOTE — Telephone Encounter (Signed)
Pt camr e in to do chest ct and presented with a new ins card [employer] I them precerted it while she was getting the ct done auth# [id number] Tobe Sos

## 2020-10-12 ENCOUNTER — Telehealth: Payer: Self-pay | Admitting: Emergency Medicine

## 2020-10-12 NOTE — Telephone Encounter (Signed)
Called and spoke with Patient. Patient wanted Dr. Delton Coombes that she had her CT chest completed 10/06/20. Patient was requesting results.  Patient aware Dr. Delton Coombes is not in office today.  Message routed to Dr. Delton Coombes to advise

## 2020-10-13 NOTE — Telephone Encounter (Signed)
Please let her know that the inflammation in her lungs is significantly improved after treatment with prednisone. I think it is ok for her to be off the prednisone if she has not already stopped it.

## 2020-10-13 NOTE — Telephone Encounter (Signed)
I have called and spoke with pt and she is aware of results per RB of the ct scan.  She will stop the prednisone per RB recs.

## 2021-02-14 ENCOUNTER — Encounter (HOSPITAL_COMMUNITY): Payer: Self-pay

## 2021-02-14 ENCOUNTER — Ambulatory Visit (HOSPITAL_COMMUNITY)
Admission: EM | Admit: 2021-02-14 | Discharge: 2021-02-14 | Disposition: A | Discharge status: Home / Self Care | Payer: BC Managed Care – PPO | Attending: Family Medicine | Admitting: Family Medicine

## 2021-02-14 ENCOUNTER — Ambulatory Visit (INDEPENDENT_AMBULATORY_CARE_PROVIDER_SITE_OTHER): Payer: BC Managed Care – PPO

## 2021-02-14 ENCOUNTER — Other Ambulatory Visit: Payer: Self-pay

## 2021-02-14 DIAGNOSIS — W2209XA Striking against other stationary object, initial encounter: Secondary | ICD-10-CM

## 2021-02-14 DIAGNOSIS — M79641 Pain in right hand: Secondary | ICD-10-CM | POA: Diagnosis not present

## 2021-02-14 MED ORDER — KETOROLAC TROMETHAMINE 30 MG/ML IJ SOLN
INTRAMUSCULAR | Status: AC
  Filled 2021-02-14: qty 1

## 2021-02-14 MED ORDER — KETOROLAC TROMETHAMINE 30 MG/ML IJ SOLN
30.0000 mg | Freq: Once | INTRAMUSCULAR | Status: AC
  Administered 2021-02-14: 30 mg via INTRAMUSCULAR

## 2021-02-14 MED ORDER — NAPROXEN 500 MG PO TABS: 500.0000 mg | ORAL_TABLET | Freq: Two times a day (BID) | ORAL | 0 refills | Status: DC | PRN

## 2021-02-14 NOTE — ED Triage Notes (Signed)
Pt presents with pain in the right hand radiates to right wrist since this morning after she hited hand with a shelf at work. Denies numbness, tingling.

## 2021-02-14 NOTE — ED Provider Notes (Signed)
[facility]    CSN: [id number] Arrival date & time: 02/14/21  1512      History   Chief Complaint Chief Complaint  Patient presents with  . Hand Pain    HPI Hannah Ellis is a 55 y.o. female.   Presenting today with 1 day history of right posterior hand swelling, pain, stiffness since this morning when she hit her hand against a shelf.  She states that the third metacarpal and down into the dorsal hand is where it hurts the worst.  Able to move the fingers but very painful to do so.  Denies discoloration, diffuse swelling up into the arm, numbness, tingling, skin abrasion.  So far not tried anything for symptoms.     Past Medical History:  Diagnosis Date  . Arthritis    "knees" (02/17/2017)  . 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 Depression   . GERD (gastroesophageal reflux disease)   . Hypertension   . Pneumonia ~ 2017  . Sarcoidosis, lung (HCC)   . Stroke (HCC) 05/09/2015   "vs TIA; mild"   . TIA (transient ischemic attack) 05/09/2015   "vs CVA; denies residual on 02/17/2017)    Patient Active Problem List   Diagnosis Date Noted  . Hypokalemia   . Abdominal pain 02/17/2017  . Pyelonephritis   . 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 Sarcoidosis 10/15/2016  . Encephalopathy, hypertensive 07/11/2015  . Anxiety state 07/11/2015  . Essential hypertension 07/11/2015  . TIA (transient ischemic attack) 05/09/2015  . Uncontrolled hypertension 05/09/2015  . Patient nonadherence 05/09/2015  . Microcytic anemia 05/09/2015    Past Surgical History:  Procedure Laterality Date  . APPENDECTOMY    . CESAREAN SECTION  1998  . EYE SURGERY Left ~ 1972  . FOOT SURGERY Right    "took some bone out of pinky and one next to it"  . TUBAL LIGATION  1998    OB History   No obstetric history on file.      Home Medications    Prior to Admission medications   Medication Sig Start Date End Date Taking? Authorizing Provider  naproxen (NAPROSYN) 500 MG tablet Take 1  tablet (500 mg total) by mouth 2 (two) times daily as needed. 02/14/21  Yes Particia Nearing, PA-C  albuterol (VENTOLIN HFA) 108 (90 Base) MCG/ACT inhaler Inhale 1-2 puffs into the lungs every 6 (six) hours as needed for wheezing or shortness of breath. 03/01/19   Ivonne Andrew, NP  amLODipine (NORVASC) 10 MG tablet Take 1 tablet (10 mg total) by mouth daily. 08/27/20   Mesner, Barbara Cower, MD  aspirin EC 325 MG EC tablet Take 1 tablet (325 mg total) by mouth daily. 05/11/15   Joseph Art, DO  benzonatate (TESSALON) 200 MG capsule Take 1 capsule (200 mg total) by mouth 3 (three) times daily as needed for cough. Patient not taking: No sig reported 03/01/19   Ivonne Andrew, NP  ferrous sulfate 325 (65 FE) MG tablet Take 325 mg by mouth daily with breakfast.    [provider]  hydrALAZINE (APRESOLINE) 25 MG tablet Take 1 tablet (25 mg total) by mouth 2 (two) times daily. 08/27/20   Mesner, Barbara Cower, MD  hydrochlorothiazide (HYDRODIURIL) 25 MG tablet Take 1 tablet (25 mg total) by mouth daily. 08/27/20   Mesner, Barbara Cower, MD  omeprazole (PRILOSEC) 40 MG capsule Take 40 mg by mouth daily. 10/04/16   [provider]  predniSONE (DELTASONE) 10 MG tablet Take 3  tablets (30 mg total) by mouth daily. 09/05/20   Leslye Peer, MD  sulfamethoxazole-trimethoprim (BACTRIM DS) 800-160 MG tablet Take one tablet 3 times a week while on Prednisone 09/05/20   Leslye Peer, MD  topiramate (TOPAMAX) 25 MG tablet TK 4 TS PO HS 01/05/19   [provider]  triamcinolone cream (KENALOG) 0.1 % Apply 1 application topically daily. Dry skin 05/12/17   [provider]  valsartan (DIOVAN) 160 MG tablet Take 1 tablet (160 mg total) by mouth daily. 08/27/20   Mesner, Barbara Cower, MD    Family History Family History  Problem Relation Age of Onset  . Hypertension Mother   . Colon cancer Mother   . Hypertension Father   . Hypertension Brother     Social History Social History   Tobacco Use  .  Smoking status: Never Smoker  . Smokeless tobacco: Never Used  Vaping Use  . Vaping Use: Never used  Substance Use Topics  . Alcohol use: No  . Drug use: No     Allergies   Patient has no known allergies.   Review of Systems Review of Systems Per HPI  Physical Exam Triage Vital Signs ED Triage Vitals  Enc Vitals Group     BP 02/14/21 1550 (!) 142/90     Pulse Rate 02/14/21 1550 77     Resp 02/14/21 1550 19     Temp 02/14/21 1550 98.3 F (36.8 C)     Temp Source 02/14/21 1550 Oral     SpO2 02/14/21 1550 98 %     Weight --      Height --      Head Circumference --      Peak Flow --      Pain Score 02/14/21 1548 8     Pain Loc --      Pain Edu? --      Excl. in GC? --    No data found.  Updated Vital Signs BP (!) 142/90 (BP Location: Left Arm)   Pulse 77   Temp 98.3 F (36.8 C) (Oral)   Resp 19   SpO2 98%   Visual Acuity Right Eye Distance:   Left Eye Distance:   Bilateral Distance:    Right Eye Near:   Left Eye Near:    Bilateral Near:     Physical Exam Vitals and nursing note reviewed.  Constitutional:      Appearance: Normal appearance. She is not ill-appearing.  HENT:     Head: Atraumatic.  Eyes:     Extraocular Movements: Extraocular movements intact.     Conjunctiva/sclera: Conjunctivae normal.  Cardiovascular:     Rate and Rhythm: Normal rate and regular rhythm.     Heart sounds: Normal heart sounds.  Pulmonary:     Effort: Pulmonary effort is normal.     Breath sounds: Normal breath sounds.  Musculoskeletal:        General: Swelling, tenderness and signs of injury present. Normal range of motion.     Cervical back: Normal range of motion and neck supple.     Comments: Significant tenderness to palpation right dorsal hand and areas of edema leading up to third MCP  Skin:    General: Skin is warm and dry.     Findings: No bruising or erythema.  Neurological:     Mental Status: She is alert and oriented to person, place, and time.      Comments: Right upper extremity neurovascularly intact  Psychiatric:  Mood and Affect: Mood normal.        Thought Content: Thought content normal.        Judgment: Judgment normal.    UC Treatments / Results  Labs (all labs ordered are listed, but only abnormal results are displayed) Labs Reviewed - No data to display  EKG  Radiology DG Hand Complete Right  Result Date: 02/14/2021 CLINICAL DATA:  Right hand pain and swelling after injury, struck hand on a shelf this morning. EXAM: RIGHT HAND - COMPLETE 3+ VIEW COMPARISON:  None. FINDINGS: There is no evidence of fracture or dislocation. There is no evidence of arthropathy or other focal bone abnormality. Mild dorsal soft tissue edema. IMPRESSION: Soft tissue edema. No fracture or dislocation. Electronically Signed   By: Narda Rutherford M.D.   On: 02/14/2021 17:18    Procedures Procedures (including critical care time)  Medications Ordered in UC Medications  ketorolac (TORADOL) 30 MG/ML injection 30 mg (30 mg Intramuscular Given 02/14/21 1621)    Initial Impression / Assessment and Plan / UC Course  I have reviewed the triage vital signs and the nursing notes.  Pertinent labs & imaging results that were available during my care of the patient were reviewed by me and considered in my medical decision making (see chart for details).     X-ray right hand negative for acute bony abnormality today.  Patient already reporting symptomatic improvement with IM Toradol, discussed RICE protocol, NSAIDs and Tylenol alternating.  Work note given for rest.  Final Clinical Impressions(s) / UC Diagnoses   Final diagnoses:  Right hand pain   Discharge Instructions   None    ED Prescriptions    Medication Sig Dispense Auth. Provider   naproxen (NAPROSYN) 500 MG tablet Take 1 tablet (500 mg total) by mouth 2 (two) times daily as needed. 30 tablet Particia Nearing, [place]     PDMP not reviewed this encounter.   Particia Nearing, [place] 02/14/21 1918

## 2021-04-05 ENCOUNTER — Ambulatory Visit: Payer: BC Managed Care – PPO | Admitting: Emergency Medicine

## 2021-04-05 ENCOUNTER — Encounter: Payer: Self-pay | Admitting: Emergency Medicine

## 2021-04-05 ENCOUNTER — Other Ambulatory Visit: Payer: Self-pay

## 2021-04-05 DIAGNOSIS — D869 Sarcoidosis, unspecified: Secondary | ICD-10-CM

## 2021-04-05 DIAGNOSIS — K219 Gastro-esophageal reflux disease without esophagitis: Secondary | ICD-10-CM

## 2021-04-05 MED ORDER — PREDNISONE 10 MG PO TABS: ORAL_TABLET | ORAL | 0 refills | Status: DC

## 2021-04-05 MED ORDER — AZITHROMYCIN 250 MG PO TABS: ORAL_TABLET | ORAL | 0 refills | Status: DC

## 2021-04-05 MED ORDER — ALBUTEROL SULFATE HFA 108 (90 BASE) MCG/ACT IN AERS: 1.0000 | INHALATION_SPRAY | Freq: Four times a day (QID) | RESPIRATORY_TRACT | 0 refills | Status: AC | PRN

## 2021-04-05 MED ORDER — OMEPRAZOLE 40 MG PO CPDR: 40.0000 mg | DELAYED_RELEASE_CAPSULE | Freq: Every day | ORAL | 5 refills | Status: DC

## 2021-04-05 NOTE — Assessment & Plan Note (Signed)
I treated her with 3 weeks of prednisone for active disease that was principally radiographic progression and inflammation with minimal symptoms.  Her CT did improve but not back to her previous baseline in 2018.  Now she has 3 days of acute symptoms, mainly cough and substernal chest discomfort when supine.  No clear infectious prodrome.  Question whether GERD is a component.  She is not currently on her PPI.  In absence of significant wheezing this may not be due to sarcoid but possible.  I think we should treat her with a short course of prednisone, antibiotics for possible flare.  Get her back on her albuterol and omeprazole as GERD is a potential precipitant to the symptoms.  I will see her back to ensure that her cough and chest tightness improve in the short-term.  Then we will need to consider long-term issue of possible active sarcoidosis, timing of a repeat CT chest.  She may need chronic anti-inflammatory regimen.  I will consider methotrexate if that is the case.  Follow-up next available, approximately 1 month.

## 2021-04-05 NOTE — Progress Notes (Signed)
Subjective:    Patient ID: Hannah Ellis, female    DOB: 1965-12-24, 55 y.o.   MRN: [id number]  HPI  ROV 09/05/20 --55 year old woman with a history of pulmonary and cutaneous sarcoidosis, mild obstructive lung disease, chronic cough. She has a right upper lobe peribronchovascular reticular opacity that has been followed on CT scan. We repeated a CT chest 08/30/2020 and I have reviewed, this shows significant increase in upper lobe dominant clustered perilymphatic nodularity when compared with 08/29/2017, no overt evidence for fibrotic change. No significant mediastinal adenopathy. She denies any dyspnea, CP, cough, wheeze. She was in the ED recently with HA and significant HTN - she had run out of her meds for several months.   ROV 04/05/2021 --55 year old woman with a history of pulmonary and cutaneous sarcoidosis with associated mild obstructive lung disease.  She also has a history of chronic cough.  I last saw her in December 2021 to review her clinical status and CT chest.  That scan showed significant progression of upper lobe predominant clustered nodularity and lymphadenopathy.  Based on this I treated her with prednisone 30 mg daily for 3 weeks, Bactrim prophylaxis.  Repeat high-resolution CT chest 10/06/2020 reviewed by me showed marked improvement in her confluent peribronchovascular nodular disease but not full resolution and still more progressive than 2018. Today she reports that she is experiencing cough, chest tightness, some mid-chest discomfort at night when she is laying down. The cough is non-productive. No nasal congestion or mucous. No fevers. No known sick contacts. Prilosec is on her med list but she says she is not taking. She is out of albuterol, has not been able to use during this episode. No rash.    Review of Systems  As per HPI     Objective:   Physical Exam Vitals:   04/05/21 1158  BP: 120/80  Pulse: 77  Temp: 98 F (36.7 C)  TempSrc: Oral  SpO2: 98%   Weight: 188 lb 9.6 oz (85.5 kg)  Height: 5\' 4"  (1.626 m)   Gen: Pleasant, well-nourished, in no distress,  normal affect, frequent dry coughing  ENT: No lesions,  mouth clear,  oropharynx clear, no postnasal drip  Neck: No JVD, no stridor  Lungs: No use of accessory muscles, few scattered insp crackles, no wheezes.   Cardiovascular: RRR, heart sounds normal, no murmur or gallops, no peripheral edema  Musculoskeletal: No deformities, no cyanosis or clubbing  Neuro: alert, non focal  Skin: no rash on the arms or face     Assessment & Plan:  Sarcoidosis (HCC) I treated her with 3 weeks of prednisone for active disease that was principally radiographic progression and inflammation with minimal symptoms.  Her CT did improve but not back to her previous baseline in 2018.  Now she has 3 days of acute symptoms, mainly cough and substernal chest discomfort when supine.  No clear infectious prodrome.  Question whether GERD is a component.  She is not currently on her PPI.  In absence of significant wheezing this may not be due to sarcoid but possible.  I think we should treat her with a short course of prednisone, antibiotics for possible flare.  Get her back on her albuterol and omeprazole as GERD is a potential precipitant to the symptoms.  I will see her back to ensure that her cough and chest tightness improve in the short-term.  Then we will need to consider long-term issue of possible active sarcoidosis, timing of a repeat CT chest.  She  may need chronic anti-inflammatory regimen.  I will consider methotrexate if that is the case.  Follow-up next available, approximately 1 month.  GERD (gastroesophageal reflux disease) She does have some central chest discomfort that is worse when she is supine.  Question whether active GERD is contributing to her current flaring symptoms, especially the cough.  I will get her back on omeprazole 40 mg once daily.  Levy Pupa, MD, PhD 04/05/2021, 12:23  PM [facility] [phone] or if no answer [phone]

## 2021-04-05 NOTE — Assessment & Plan Note (Signed)
She does have some central chest discomfort that is worse when she is supine.  Question whether active GERD is contributing to her current flaring symptoms, especially the cough.  I will get her back on omeprazole 40 mg once daily.

## 2021-04-05 NOTE — Addendum Note (Signed)
Addended by: Dorisann Frames R on: 04/05/2021 02:22 PM   Modules accepted: Orders

## 2021-04-05 NOTE — Patient Instructions (Addendum)
Please take prednisone as directed until completely gone Please take azithromycin as directed until completely gone. We will refill your albuterol inhaler.  Use 2 puffs up to every 4 hours if needed for shortness of breath, chest tightness, wheezing. Please restart omeprazole 40mg  once daily until next visit.  We will need to discuss the timing of a repeat CT scan of the chest to evaluate for any evidence of active sarcoidosis.  We can talk about this at your next visit.  You may benefit from being on anti-inflammatory medication every day. Follow with Dr in 1 month or next available.

## 2021-07-05 ENCOUNTER — Telehealth: Payer: Self-pay | Admitting: Emergency Medicine

## 2021-07-05 MED ORDER — PREDNISONE 10 MG PO TABS: ORAL_TABLET | ORAL | 0 refills | Status: DC

## 2021-07-05 NOTE — Telephone Encounter (Signed)
Please have her take pred as below. If her sx persist then she needs to call us and be seen in office  Pred >> Take 40mg  daily for 3 days, then 30mg  daily for 3 days, then 20mg  daily for 3 days, then 10mg  daily for 3 days, then stop

## 2021-07-05 NOTE — Telephone Encounter (Signed)
Spoke with the pt and notified of response per RB  She verbalized understanding  Rx sent to pharm  She will call if not improving

## 2021-07-05 NOTE — Telephone Encounter (Signed)
Called and spoke with Patient.  Patient stated she is having a sarcoid flare.  Patient stated she is having a dry, nonproductive cough, sob, and bilateral side pain, with cough.  Patient is requesting prescriptions to help sent  to [facility] [address].   Message routed to Dr. Delton Coombes

## 2021-07-10 ENCOUNTER — Telehealth: Payer: Self-pay | Admitting: Emergency Medicine

## 2021-07-10 NOTE — Telephone Encounter (Signed)
Called and spoke with pt and she is aware that it looks to be 2017

## 2021-07-12 ENCOUNTER — Telehealth: Payer: Self-pay | Admitting: Emergency Medicine

## 2021-07-12 NOTE — Telephone Encounter (Signed)
Spoke with the pt  She states still having dry cough despite taking the prednisone, which she is still on  She is still having the discomfort in both sides  She reports her covid test was neg  OV with BW for 9:30 am tomorrow

## 2021-07-13 ENCOUNTER — Ambulatory Visit (INDEPENDENT_AMBULATORY_CARE_PROVIDER_SITE_OTHER): Payer: Self-pay | Admitting: Primary Care

## 2021-07-13 ENCOUNTER — Other Ambulatory Visit: Payer: Self-pay

## 2021-07-13 ENCOUNTER — Encounter: Payer: Self-pay | Admitting: Primary Care

## 2021-07-13 ENCOUNTER — Telehealth: Payer: Self-pay | Admitting: Primary Care

## 2021-07-13 ENCOUNTER — Ambulatory Visit (INDEPENDENT_AMBULATORY_CARE_PROVIDER_SITE_OTHER): Payer: BC Managed Care – PPO

## 2021-07-13 VITALS — BP 140/82 | HR 95 | Temp 98.2°F | Ht 64.0 in | Wt 195.0 lb

## 2021-07-13 DIAGNOSIS — D869 Sarcoidosis, unspecified: Secondary | ICD-10-CM

## 2021-07-13 MED ORDER — PREDNISONE 10 MG PO TABS: ORAL_TABLET | ORAL | 0 refills | Status: AC

## 2021-07-13 MED ORDER — METHYLPREDNISOLONE ACETATE 80 MG/ML IJ SUSP
80.0000 mg | Freq: Once | INTRAMUSCULAR | Status: AC
  Administered 2021-07-13: 80 mg via INTRAMUSCULAR

## 2021-07-13 NOTE — Telephone Encounter (Signed)
Sorry did not realize both were on prescription. I want Sig: Take 4 tablets (40 mg total) by mouth daily with breakfast for 5 days, THEN 2 tablets (20 mg total) daily with breakfast for 5 days, THEN 1 tablet (10 mg total) daily with breakfast for 5 days.   Thanks

## 2021-07-13 NOTE — Addendum Note (Signed)
Addended by: Kathrin Penner on: 07/13/2021 10:18 AM   Modules accepted: Orders

## 2021-07-13 NOTE — Telephone Encounter (Signed)
Called pt's pharmacy to clarify which instructions we needed to have on pred taper and they verbalized understanding and stated they will get Rx ready for pt. Nothing further needed.

## 2021-07-13 NOTE — Progress Notes (Signed)
@Patient  ID: , female    DOB: 09/25/1965, 55 y.o.   MRN: [id number]  Chief Complaint  Patient presents with   Sarcoidosis   Wheezing   Cough    3 pred left and still not clearing up    Referring provider: [id number], FNP   HPI: 55 year old female, never smoked.  Past medical history significant for sarcoidosis.  Patient of Dr. 53, last seen in office on 04/05/2021.  07/13/2021- Interim hx  Patient presents today for acute visit. She called our office on 07/05/21 with reports of having non-productive cough. She was sent in prednisone taper by Dr. 07/07/21 for suspected sarcoid flare. She is no better with steroids. She has persistent dry cough, chest discomfort and voice hoarseness. She has mild associated shortness of breath. Denies f/c/s, nasal congestion, chest tightness, wheezing.          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   Depression    GERD (gastroesophageal reflux disease)    Hypertension    Pneumonia ~ 2017   Sarcoidosis, lung (HCC)    Stroke (HCC) 05/09/2015   "vs TIA; mild"    TIA (transient ischemic attack) 05/09/2015   "vs CVA; denies residual on 02/17/2017)    Tobacco History: Social History   Tobacco Use  Smoking Status Never  Smokeless Tobacco Never   Counseling given: Not Answered   Outpatient Medications Prior to Visit  Medication Sig Dispense Refill   albuterol (VENTOLIN HFA) 108 (90 Base) MCG/ACT inhaler Inhale 1-2 puffs into the lungs every 6 (six) hours as needed for wheezing or shortness of breath. 1  each 0   amLODipine (NORVASC) 10 MG tablet Take 1 tablet (10 mg total) by mouth daily. 30 tablet 1   aspirin EC 325 MG EC tablet Take 1 tablet (325 mg total) by mouth daily. 30 tablet 0   atorvastatin (LIPITOR) 10 MG tablet TAKE 1 TABLET(10 MG) BY MOUTH DAILY     azithromycin (ZITHROMAX Z-PAK) 250 MG tablet Take 2 tabs today, then 1 tab until gone 6 each 0   benzonatate (TESSALON) 200 MG capsule Take 1 capsule (200 mg total) by mouth 3 (three) times daily as needed for cough. 30 capsule 1   chlorzoxazone (PARAFON) 500 MG tablet chlorzoxazone 500 mg tablet  TAKE 1 TABLET BY MOUTH UP TO FOUR TIMES DAILY AS NEEDED FOR HEADACHE     ferrous sulfate 325 (65 FE) MG tablet Take 325 mg by mouth daily with breakfast.     gabapentin (NEURONTIN) 300 MG capsule gabapentin 300 mg capsule  TAKE 1 CAPSULE BY MOUTH EVERY DAY AT BEDTIME     hydrALAZINE (APRESOLINE) 25 MG tablet Take 1 tablet (25 mg total) by mouth 2 (two) times daily. 60 tablet 1   hydrochlorothiazide (HYDRODIURIL) 25 MG tablet Take 1 tablet (25 mg total) by mouth daily. 30 tablet 1   naproxen (NAPROSYN) 500 MG tablet Take 1 tablet (500 mg total) by mouth 2 (two) times daily as needed. 30 tablet 0   omeprazole (PRILOSEC) 40 MG capsule Take 1 capsule (40 mg total) by mouth daily. 30 capsule 5  predniSONE (DELTASONE) 10 MG tablet Take 4 tablets X 3 days, 3 tabs X 3 days, 2 tabs x 3 days, 1 tab x 3 days 30 tablet 0   sulfamethoxazole-trimethoprim (BACTRIM DS) 800-160 MG tablet Take one tablet 3 times a week while on Prednisone 9 tablet 0   topiramate (TOPAMAX) 25 MG tablet TK 4 TS PO HS     topiramate (TOPAMAX) 25 MG tablet Take 3 tablets by mouth at bedtime.     triamcinolone cream (KENALOG) 0.1 % Apply 1 application topically daily. Dry skin  0   valsartan (DIOVAN) 160 MG tablet Take 1 tablet (160 mg total) by mouth daily. 30 tablet 1   predniSONE (DELTASONE) 10 MG tablet 4 x 3 days, 3 x 3 days, 2 x 3 days, 1 x 3 days, then stop 30 tablet 0    No facility-administered medications prior to visit.      Review of Systems  Review of Systems  Constitutional: Negative.  Negative for chills, diaphoresis and fatigue.  HENT: Negative.  Negative for congestion and postnasal drip.   Respiratory:  Positive for cough. Negative for chest tightness, shortness of breath and wheezing.     Physical Exam  BP 140/82   Pulse 95   Temp 98.2 F (36.8 C)   Ht 5\' 4"  (1.626 m)   Wt 195 lb (88.5 kg)   SpO2 98%   BMI 33.47 kg/m  Physical Exam Constitutional:      Appearance: Normal appearance.  HENT:     Head: Normocephalic and atraumatic.     Mouth/Throat:     Mouth: Mucous membranes are moist.     Pharynx: Oropharynx is clear.  Cardiovascular:     Rate and Rhythm: Normal rate and regular rhythm.  Pulmonary:     Effort: Pulmonary effort is normal.     Breath sounds: Wheezing present.     Comments: Reactive cough Musculoskeletal:        General: Normal range of motion.  Skin:    General: Skin is warm and dry.  Neurological:     General: No focal deficit present.     Mental Status: She is alert and oriented to person, place, and time. Mental status is at baseline.  Psychiatric:        Mood and Affect: Mood normal.        Behavior: Behavior normal.        Thought Content: Thought content normal.        Judgment: Judgment normal.     Lab Results:  CBC    Component Value Date/Time   WBC 7.2 08/26/2020 1338   RBC 5.43 (H) 08/26/2020 1338   HGB 14.2 08/26/2020 1338   HGB 13.1 10/29/2016 1124   HCT 46.5 (H) 08/26/2020 1338   HCT 41.7 10/29/2016 1124   PLT 152 08/26/2020 1338   PLT 226 10/29/2016 1124   MCV 85.6 08/26/2020 1338   MCV 80 10/29/2016 1124   MCH 26.2 08/26/2020 1338   MCHC 30.5 08/26/2020 1338   RDW 14.9 08/26/2020 1338   RDW 16.1 (H) 10/29/2016 1124   LYMPHSABS 1.6 08/26/2020 1338   MONOABS 0.5 08/26/2020 1338   EOSABS 0.1 08/26/2020 1338   BASOSABS 0.0 08/26/2020 1338    BMET    Component Value  Date/Time   NA 140 08/26/2020 1338   NA 141 10/29/2016 1124   K 4.2 08/26/2020 1338   CL 105 08/26/2020 1338   CO2 27 08/26/2020 1338   GLUCOSE 87 08/26/2020  1338   BUN 17 08/26/2020 1338   BUN 10 10/29/2016 1124   CREATININE 1.13 (H) 08/26/2020 1338   CALCIUM 9.9 08/26/2020 1338   GFRNONAA 58 (L) 08/26/2020 1338   GFRAA >60 02/11/2018 1916    BNP No results found for: BNP  ProBNP No results found for: PROBNP  Imaging: No results found.   Assessment & Plan:   Sarcoidosis (HCC) Suspected sarcoidosis flare. Patient has symptoms of dry cough with associated voice hoarseness, shortness of breath and pleuritic chest pain. We will check CXR today and ACE level. Patient received depo-medrol 80mg  IM x 1 today in office. Sending in extended prednisone taper. Recommend patient use BD every 6 hours and take cough suppressants as directed.   Recommendations:  - Use albuterol inhaler every 6 hours - Take delsym cough syrup every 12 hours for cough suppression - Use tesslon perles every 8 hours for cough suppression   Orders: - CXR and labs today re: sarcoidosis (ordered)   Orders: - Depo-medrol 80mg  IM x 1 today re: sarcoidosis exacerbation   Rx: - Start NEW Prednisone taper tomorrow   Follow-up: - If not better please return   , NP 07/13/2021

## 2021-07-13 NOTE — Assessment & Plan Note (Signed)
Suspected sarcoidosis flare. Patient has symptoms of dry cough with associated voice hoarseness, shortness of breath and pleuritic chest pain. We will check CXR today and ACE level. Patient received depo-medrol 80mg  IM x 1 today in office. Sending in extended prednisone taper. Recommend patient use BD every 6 hours and take cough suppressants as directed.   Recommendations:  - Use albuterol inhaler every 6 hours - Take delsym cough syrup every 12 hours for cough suppression - Use tesslon perles every 8 hours for cough suppression   Orders: - CXR and labs today re: sarcoidosis (ordered)   Orders: - Depo-medrol 80mg  IM x 1 today re: sarcoidosis exacerbation   Rx: - Start NEW Prednisone taper tomorrow   Follow-up: - If not better please return

## 2021-07-13 NOTE — Telephone Encounter (Signed)
Sig: Take 4 tablets (40 mg total) by mouth daily with breakfast for 5 days, THEN 2 tablets (20 mg total) daily with breakfast for 5 days, THEN 1 tablet (10 mg total) daily with breakfast for 5 days.    SigTake 4 tabs po daily x 3 days; then 3 tabs daily x3 days; then 2 tabs daily x3 days; then 1 tab daily x 3 days; then stop.  This has both on the prescription. Can you please advise on the correct sig for this prednisone for the pharmacy? Please advise.

## 2021-07-13 NOTE — Patient Instructions (Addendum)
Recommendations: Use albuterol inhaler every 6 hours Take delsym cough syrup every 12 hours for cough suppression Use tesslon perles every 8 hours for cough suppression   Orders: CXR and labs today re: sarcoidosis (ordered)   Orders: Depo-medrol 80mg  IM x 1 today re: sarcoidosis exacerbation   Rx: Start NEW Prednisone taper tomorrow   Follow-up: If not better please return

## 2021-07-16 ENCOUNTER — Telehealth: Payer: Self-pay | Admitting: Emergency Medicine

## 2021-07-16 LAB — ANGIOTENSIN CONVERTING ENZYME: Angiotensin-Converting Enzyme: 43 U/L (ref 9–67)

## 2021-07-16 NOTE — Telephone Encounter (Signed)
Glad her cough is better. Have her take tylenol 650mg  every 6 hours for pain and delsym twice daily if not already. If pain continues let me know would need D-dimer and possibly CTA

## 2021-07-16 NOTE — Telephone Encounter (Signed)
Patient was made aware of her results on 10/21. Patient is also asking for the lab results.   "Please let patient know ACE level was normal. If not better would recommend CT chest imaging"  Called and spoke with patient. She verbalized understanding. I asked her how she was feeling today. She stated that the coughing had calmed down a lot since her visit. She is still experiencing bilateral flank pain. The pain starts out dull and then will get sharp. She was having these pains the day of her visit and assumed it was coming from the coughing. I advised her that I would Beth know.   Beth, can you please advise? Thanks.

## 2021-07-16 NOTE — Telephone Encounter (Signed)
See phone note from 10/21. Closing duplicate encounter.

## 2021-07-16 NOTE — Progress Notes (Signed)
Please let patient know ACE level was normal. If not better would recommend CT chest imaging

## 2021-07-16 NOTE — Telephone Encounter (Signed)
Called and spoke with patient. She verbalized understanding. She will start taking the Tylenol. She will call us back by Wednesday or Thursday if she is not feeling any better with the pain.   Nothing further needed at time of call.

## 2021-07-19 ENCOUNTER — Telehealth: Payer: Self-pay | Admitting: Emergency Medicine

## 2021-07-19 NOTE — Telephone Encounter (Signed)
Spoke with the pt and notified of response per RB  She verbalized understanding  Will call back for ov if not improving

## 2021-07-19 NOTE — Telephone Encounter (Signed)
Spoke with the pt  She states started having right leg pain behind knee  Just started a few hours ago and has been constant  She is not having any swelling or redness  States no hx of DVT  She is asking if she could be having this pain due to sarcoid  Pt recently seen by Beth on 07/13/21 for cough- on pred taper and cough is about the same  RB- please advise what you think thanks

## 2021-07-19 NOTE — Telephone Encounter (Signed)
I don't think the pain is related to her sarcoid, especially since it started while she is on prednisone.  If it continues then we will need to consider DVT - best plan would be for her to be seen in office next week if pain persists

## 2021-07-26 ENCOUNTER — Telehealth: Payer: Self-pay | Admitting: Emergency Medicine

## 2021-07-26 NOTE — Telephone Encounter (Signed)
Called patient but she did not answer. Left detailed message for her.

## 2021-07-26 NOTE — Telephone Encounter (Signed)
Called and spoke to pt. Pt states she is still having the leg pain and her coughing is not improving. There is a previous phone note from 10/27 regarding her leg pain. Pt states she feels the pred isnt helping like it should. Pt is on her last 3 days of prednisone. Appt scheduled with Buelah Manis, NP, for 11/4. Pt is aware to seek emergency care if she has any new or worsening s/s.   Will forward to [facility] as FYI.

## 2021-07-26 NOTE — Telephone Encounter (Signed)
Needs to reach out to PCP about leg pain

## 2021-07-27 ENCOUNTER — Encounter: Payer: Self-pay | Admitting: Primary Care

## 2021-07-27 ENCOUNTER — Other Ambulatory Visit: Payer: Self-pay

## 2021-07-27 ENCOUNTER — Ambulatory Visit (INDEPENDENT_AMBULATORY_CARE_PROVIDER_SITE_OTHER): Payer: BC Managed Care – PPO | Admitting: Primary Care

## 2021-07-27 VITALS — BP 142/92 | HR 84 | Temp 98.6°F | Ht 64.0 in | Wt 195.2 lb

## 2021-07-27 DIAGNOSIS — R0781 Pleurodynia: Secondary | ICD-10-CM

## 2021-07-27 DIAGNOSIS — R221 Localized swelling, mass and lump, neck: Secondary | ICD-10-CM

## 2021-07-27 DIAGNOSIS — M25561 Pain in right knee: Secondary | ICD-10-CM | POA: Insufficient documentation

## 2021-07-27 DIAGNOSIS — D869 Sarcoidosis, unspecified: Secondary | ICD-10-CM

## 2021-07-27 NOTE — Assessment & Plan Note (Addendum)
-   Patient has several new symptoms not related to sarcoid. Cough improved with prednisone but she is still having intermittent left sided pleuritic pain. ACE level was normal. No shortness of breath. We are checking a d-dimer, she needs either CTA vs CT chest wo contrast depending on those results.

## 2021-07-27 NOTE — Assessment & Plan Note (Addendum)
-   New. No masses/nodules felt on exam, neck was tender to touch.  - Needs thyroid ultrasound, will likely need to follow-up with PCP +/- ENT

## 2021-07-27 NOTE — Progress Notes (Signed)
@Patient  ID: Hannah Ellis, female    DOB: Jul 03, 1966, 55 y.o.   MRN: [id number]  Chief Complaint  Patient presents with   Follow-up    Pt states pain in left lung, calf, and swollen in neck area    Referring provider: Gregor Hams, FNP  HPI: 55 year old female, never smoked.  Past medical history significant for sarcoidosis.  Patient of Dr. Lamonte Sakai, last seen in office on 04/05/2021.  Previous [facility] pulmonary encounter:  07/13/2021  Patient presents today for acute visit. She called our office on 07/05/21 with reports of having non-productive cough. She was sent in prednisone taper by Dr. Lamonte Sakai for suspected sarcoid flare. She is no better with steroids. She has persistent dry cough, chest discomfort and voice hoarseness. She has mild associated shortness of breath. Denies f/c/s, nasal congestion, chest tightness, wheezing.     07/27/2021- Interim hx  Patient presents today for 2 week follow-up. During last visit she was treated for suspected sarcoid flare with depo-medrol shot and prednisone taper. Also advised she take delsym cough syrup q12 hours, tesslon perles TID for cough and use albuterol every 6 hours as needed.  CXR on 07/13/21 showed bilateral airspace disease right greater than left, similar to the recent CT of 10/06/2020. This is presumably related to parenchymal sarcoidosis. No adenopathy. Ace level was normal.   Her cough initially "calmed down" after starting prednisone taper on 10/21 but she was still experiencing pleuritic pain. She states that her cough is not as bad as it was. She continues to have some intermittent left sided pleuritic pain. No shortness of breath. She has new neck swelling and right knee pain.   Pulmonary function testing:  January 2019 PFTs- no evidence of obstructive or restrictive airway disease. Diffusion capacity decreased.    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   Depression    GERD (gastroesophageal reflux disease)    Hypertension    Pneumonia ~ 2017   Sarcoidosis, lung ([place])    Stroke ([place]) 05/09/2015   "vs TIA; mild"    TIA (transient ischemic attack) 05/09/2015   "vs CVA; denies residual on 02/17/2017)    Tobacco History: Social History   Tobacco Use  Smoking Status Never  Smokeless Tobacco Never   Counseling given: Not Answered   Outpatient Medications Prior to Visit  Medication Sig Dispense Refill   albuterol (VENTOLIN HFA) 108 (90 Base) MCG/ACT inhaler Inhale 1-2 puffs into the lungs every 6 (six) hours as needed for wheezing or shortness of breath. 1 each 0   amLODipine (NORVASC) 10 MG tablet Take 1 tablet (10 mg total) by mouth daily. 30 tablet 1   aspirin EC 325 MG EC tablet Take 1 tablet (325 mg total) by mouth daily. 30 tablet 0   atorvastatin (LIPITOR) 10 MG tablet TAKE 1 TABLET(10 MG) BY MOUTH DAILY     azithromycin (ZITHROMAX Z-PAK) 250 MG tablet Take 2 tabs today, then 1 tab until gone 6 each 0   benzonatate (TESSALON) 200 MG capsule Take 1 capsule (200 mg total) by mouth 3 (three) times daily as needed for cough. 30 capsule 1  chlorzoxazone (PARAFON) 500 MG tablet chlorzoxazone 500 mg tablet  TAKE 1 TABLET BY MOUTH UP TO FOUR TIMES DAILY AS NEEDED FOR HEADACHE     ferrous sulfate 325 (65 FE) MG tablet Take 325 mg by mouth daily with breakfast.     gabapentin (NEURONTIN) 300 MG capsule gabapentin 300 mg capsule  TAKE 1 CAPSULE BY MOUTH EVERY DAY AT BEDTIME     hydrALAZINE (APRESOLINE) 25 MG tablet Take 1 tablet (25 mg total) by mouth 2 (two) times daily. 60 tablet 1    hydrochlorothiazide (HYDRODIURIL) 25 MG tablet Take 1 tablet (25 mg total) by mouth daily. 30 tablet 1   naproxen (NAPROSYN) 500 MG tablet Take 1 tablet (500 mg total) by mouth 2 (two) times daily as needed. 30 tablet 0   omeprazole (PRILOSEC) 40 MG capsule Take 1 capsule (40 mg total) by mouth daily. 30 capsule 5   predniSONE (DELTASONE) 10 MG tablet Take 4 tablets X 3 days, 3 tabs X 3 days, 2 tabs x 3 days, 1 tab x 3 days 30 tablet 0   predniSONE (DELTASONE) 10 MG tablet Take 4 tablets (40 mg total) by mouth daily with breakfast for 5 days, THEN 2 tablets (20 mg total) daily with breakfast for 5 days, THEN 1 tablet (10 mg total) daily with breakfast for 5 days. Take 4 tabs po daily x 3 days; then 3 tabs daily x3 days; then 2 tabs daily x3 days; then 1 tab daily x 3 days; then stop. 35 tablet 0   sulfamethoxazole-trimethoprim (BACTRIM DS) 800-160 MG tablet Take one tablet 3 times a week while on Prednisone 9 tablet 0   topiramate (TOPAMAX) 25 MG tablet TK 4 TS PO HS     topiramate (TOPAMAX) 25 MG tablet Take 3 tablets by mouth at bedtime.     triamcinolone cream (KENALOG) 0.1 % Apply 1 application topically daily. Dry skin  0   valsartan (DIOVAN) 160 MG tablet Take 1 tablet (160 mg total) by mouth daily. 30 tablet 1   No facility-administered medications prior to visit.    Review of Systems  Review of Systems  Constitutional: Negative.   HENT: Negative.    Respiratory:  Negative for chest tightness, shortness of breath and wheezing.        Pleuritic chest pain   Cardiovascular:  Negative for leg swelling.  Musculoskeletal:        Neck swelling and right knee pain    Physical Exam  BP (!) 142/92 (BP Location: Right Arm, Patient Position: Sitting, Cuff Size: Normal)   Pulse 84   Temp 98.6 F (37 C) (Oral)   Ht 5\' 4"  (1.626 m)   Wt 195 lb 3.2 oz (88.5 kg)   SpO2 99%   BMI 33.51 kg/m  Physical Exam Constitutional:      Appearance: Normal appearance.  HENT:     Head:  Normocephalic and atraumatic.     Mouth/Throat:     Mouth: Mucous membranes are moist.     Comments: Aphthous ulcer Neck:     Comments: Neck/thyroid appear swollen; no masses/nodules felt on exam Cardiovascular:     Rate and Rhythm: Normal rate and regular rhythm.  Pulmonary:     Effort: Pulmonary effort is normal.     Breath sounds: Normal breath sounds.  Musculoskeletal:        General: Normal range of motion.     Cervical back: Tenderness present.  Skin:    General: Skin is warm and dry.  Neurological:     General: No focal deficit present.     Mental Status: She is alert and oriented to person, place, and time. Mental status is at baseline.  Psychiatric:        Mood and Affect: Mood normal.        Behavior: Behavior normal.        Thought Content: Thought content normal.        Judgment: Judgment normal.     Lab Results:  CBC    Component Value Date/Time   WBC 7.2 08/26/2020 1338   RBC 5.43 (H) 08/26/2020 1338   HGB 14.2 08/26/2020 1338   HGB 13.1 10/29/2016 1124   HCT 46.5 (H) 08/26/2020 1338   HCT 41.7 10/29/2016 1124   PLT 152 08/26/2020 1338   PLT 226 10/29/2016 1124   MCV 85.6 08/26/2020 1338   MCV 80 10/29/2016 1124   MCH 26.2 08/26/2020 1338   MCHC 30.5 08/26/2020 1338   RDW 14.9 08/26/2020 1338   RDW 16.1 (H) 10/29/2016 1124   LYMPHSABS 1.6 08/26/2020 1338   MONOABS 0.5 08/26/2020 1338   EOSABS 0.1 08/26/2020 1338   BASOSABS 0.0 08/26/2020 1338    BMET    Component Value Date/Time   NA 140 08/26/2020 1338   NA 141 10/29/2016 1124   K 4.2 08/26/2020 1338   CL 105 08/26/2020 1338   CO2 27 08/26/2020 1338   GLUCOSE 87 08/26/2020 1338   BUN 17 08/26/2020 1338   BUN 10 10/29/2016 1124   CREATININE 1.13 (H) 08/26/2020 1338   CALCIUM 9.9 08/26/2020 1338   GFRNONAA 58 (L) 08/26/2020 1338   GFRAA >60 02/11/2018 1916    BNP No results found for: BNP  ProBNP No results found for: PROBNP  Imaging: DG Chest 2 View  Result Date:  07/13/2021 CLINICAL DATA:  Sarcoidosis. EXAM: CHEST - 2 VIEW COMPARISON:  03/01/2019, chest CT 10/06/2020 FINDINGS: Right perihilar airspace disease has progressed since 2020 but is similar to the more recent CT chest. There is mild airspace disease in the left perihilar chest which is unchanged. No pleural effusion no significant adenopathy. Cardiac and mediastinal contours normal. IMPRESSION: Bilateral airspace disease right greater than left, similar to the recent CT of 10/06/2020. This is presumably related to parenchymal sarcoidosis. No adenopathy. Electronically Signed   By: Marlan Palau M.D.   On: 07/13/2021 11:08     Assessment & Plan:   Sarcoidosis [facility]) - Patient has several new symptoms not related to sarcoid. Cough improved with prednisone but she is still having intermittent left sided pleuritic pain. ACE level was normal. No shortness of breath. We are checking a d-dimer, she needs either CTA vs CT chest wo contrast depending on those results.   Neck swelling - New. No masses/nodules felt on exam, neck was tender to touch.  - Needs thyroid ultrasound, will likely need to follow-up with PCP +/- ENT   Posterior right knee pain - New. Right leg does not appear significantly swollen or warm to touch. No clear evidence of DVT on exam, if D-dimer is positive will need dopplers.   40 mins spent on case: > 50% face to face with patient   Glenford Bayley, NP 07/27/2021

## 2021-07-27 NOTE — Patient Instructions (Addendum)
CXR on 07/13/21 showed bilateral airspace disease right greater than left, similar to the recent CT of 10/06/2020. This is presumably related to parenchymal sarcoidosis. No adenopathy. Ace level was normal  We will get CT imaging of chest and possible lower extremity ultrasound depending on d-dimer results   Orders: - D-dimer today (ordered- please go to [facility] for labs) - Thyroid ultrasound (ordered)   Follow-up: - First available with Dr. Delton Coombes; if you develop shortness of breath please go to ED

## 2021-07-27 NOTE — Assessment & Plan Note (Addendum)
-   New. Right leg does not appear significantly swollen or warm to touch. No clear evidence of DVT on exam, if D-dimer is positive will need dopplers.

## 2021-07-27 NOTE — Assessment & Plan Note (Deleted)
-   New anterior neck swelling, tender to touch. No masses/nodules felt on exam.  - Needs thyroid ultrasound

## 2021-07-28 LAB — D-DIMER, QUANTITATIVE: D-Dimer, Quant: <0.19 mcg/mL FEU (ref <0.50)

## 2021-08-02 ENCOUNTER — Telehealth: Payer: Self-pay | Admitting: Emergency Medicine

## 2021-08-02 ENCOUNTER — Ambulatory Visit (HOSPITAL_COMMUNITY)
Admission: RE | Admit: 2021-08-02 | Discharge: 2021-08-02 | Disposition: A | Discharge status: Home / Self Care | Payer: BC Managed Care – PPO | Source: Ambulatory Visit | Attending: Primary Care | Admitting: Primary Care

## 2021-08-02 ENCOUNTER — Other Ambulatory Visit: Payer: Self-pay

## 2021-08-02 DIAGNOSIS — R221 Localized swelling, mass and lump, neck: Secondary | ICD-10-CM | POA: Diagnosis present

## 2021-08-02 NOTE — Progress Notes (Signed)
I tried calling patient, LM. Thyroid ultrasound was normal. If we can not get in touch with her please forward to normal results pool and we can send a letter

## 2021-08-02 NOTE — Telephone Encounter (Signed)
Spoke with pt who states she wanted an OV with Dr. Delton Coombes. OV scheduled on 08/30/21. Nothing further needed at this time.

## 2021-08-02 NOTE — Progress Notes (Signed)
I spoke with the pt and notified her of results. She verbalized understanding. She is asking what to do next to figure out cause of the swelling in her neck. She states this is unchanged since her last visit. Please advise, thanks!

## 2021-08-02 NOTE — Progress Notes (Signed)
I would have her follow-up with PCP and we can refer to ENT re: neck swelling

## 2021-08-17 ENCOUNTER — Encounter (HOSPITAL_COMMUNITY): Payer: Self-pay

## 2021-08-17 ENCOUNTER — Other Ambulatory Visit: Payer: Self-pay

## 2021-08-17 ENCOUNTER — Ambulatory Visit (HOSPITAL_COMMUNITY)
Admission: EM | Admit: 2021-08-17 | Discharge: 2021-08-17 | Disposition: A | Discharge status: Home / Self Care | Payer: BC Managed Care – PPO | Attending: Internal Medicine | Admitting: Internal Medicine

## 2021-08-17 DIAGNOSIS — T148XXA Other injury of unspecified body region, initial encounter: Secondary | ICD-10-CM | POA: Diagnosis present

## 2021-08-17 LAB — CBC WITH DIFFERENTIAL/PLATELET
Abs Immature Granulocytes: 0.02 10*3/uL (ref 0.00–0.07)
Basophils Absolute: 0 10*3/uL (ref 0.0–0.1)
Basophils Relative: 1 %
Eosinophils Absolute: 0.1 10*3/uL (ref 0.0–0.5)
Eosinophils Relative: 1 %
HCT: 50 % — ABNORMAL HIGH (ref 36.0–46.0)
Hemoglobin: 15.9 g/dL — ABNORMAL HIGH (ref 12.0–15.0)
Immature Granulocytes: 0 %
Lymphocytes Relative: 20 %
Lymphs Abs: 1.4 10*3/uL (ref 0.7–4.0)
MCH: 26.9 pg (ref 26.0–34.0)
MCHC: 31.8 g/dL (ref 30.0–36.0)
MCV: 84.6 fL (ref 80.0–100.0)
Monocytes Absolute: 0.4 10*3/uL (ref 0.1–1.0)
Monocytes Relative: 5 %
Neutro Abs: 5.2 10*3/uL (ref 1.7–7.7)
Neutrophils Relative %: 73 %
Platelets: 181 10*3/uL (ref 150–400)
RBC: 5.91 MIL/uL — ABNORMAL HIGH (ref 3.87–5.11)
RDW: 14.4 % (ref 11.5–15.5)
WBC: 7.2 10*3/uL (ref 4.0–10.5)
nRBC: 0 % (ref 0.0–0.2)

## 2021-08-17 LAB — PROTIME-INR
INR: 1 (ref 0.8–1.2)
Prothrombin Time: 13.4 seconds (ref 11.4–15.2)

## 2021-08-17 NOTE — ED Provider Notes (Addendum)
[facility]    CSN: [id number] Arrival date & time: 08/17/21  7628      History   Chief Complaint Chief Complaint  Patient presents with   Bruise on Leg    HPI Hannah Ellis is a 55 y.o. female comes to the urgent care with left leg bruise which she noticed a couple of days ago.  Patient denies any trauma to the left leg.  Bruises painful, currently of moderate severity, aggravated by palpation and denies any known relieving factors.  No other areas of bruising.  Patient has had spontaneous bruising in the past.  Per the patient work-up has been unremarkable.  Patient does not take antiplatelets or anticoagulants.  She denies body aches, bruising, lymph gland swelling or weight loss.  No recent travel.  No calf pain.  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   Depression    GERD (gastroesophageal reflux disease)    Hypertension    Pneumonia ~ 2017   Sarcoidosis, lung (HCC)    Stroke (HCC) 05/09/2015   "vs TIA; mild"    TIA (transient ischemic attack) 05/09/2015   "vs CVA; denies residual on 02/17/2017)    Patient Active Problem List   Diagnosis Date Noted   Neck swelling 07/27/2021   Posterior right knee pain 07/27/2021   GERD (gastroesophageal reflux disease) 04/05/2021   Hypokalemia    Abdominal pain 02/17/2017   Pyelonephritis    Hemifacial spasm 10/29/2016   Sarcoidosis 10/15/2016   Encephalopathy, hypertensive 07/11/2015   Anxiety state 07/11/2015   Essential hypertension 07/11/2015   TIA (transient ischemic attack) 05/09/2015   Uncontrolled hypertension 05/09/2015   Patient nonadherence 05/09/2015   Microcytic anemia 05/09/2015    Past Surgical History:  Procedure Laterality Date   APPENDECTOMY     CESAREAN SECTION  1998   EYE SURGERY Left ~ 1972   FOOT SURGERY Right    "took some bone out of pinky and one next to it"   TUBAL LIGATION  1998    OB History   No  obstetric history on file.      Home Medications    Prior to Admission medications   Medication Sig Start Date End Date Taking? Authorizing Provider  albuterol (VENTOLIN HFA) 108 (90 Base) MCG/ACT inhaler Inhale 1-2 puffs into the lungs every 6 (six) hours as needed for wheezing or shortness of breath. 04/05/21   Leslye Peer, MD  amLODipine (NORVASC) 10 MG tablet Take 1 tablet (10 mg total) by mouth daily. 08/27/20   Mesner, Barbara Cower, MD  aspirin EC 325 MG EC tablet Take 1 tablet (325 mg total) by mouth daily. 05/11/15   Joseph Art, DO  atorvastatin (LIPITOR) 10 MG tablet TAKE 1 TABLET(10 MG) BY MOUTH DAILY 04/02/21   [provider]  benzonatate (TESSALON) 200 MG capsule Take 1 capsule (200 mg total) by mouth 3 (three) times daily as needed for cough. 03/01/19   Ivonne Andrew, NP  chlorzoxazone (PARAFON) 500 MG tablet chlorzoxazone 500 mg tablet  TAKE 1 TABLET BY MOUTH UP TO FOUR TIMES DAILY AS NEEDED FOR HEADACHE 01/01/21   [provider]  ferrous sulfate 325 (65 FE) MG tablet Take 325 mg by mouth daily with breakfast.    [provider]  gabapentin (NEURONTIN) 300 MG capsule gabapentin 300 mg capsule  TAKE 1 CAPSULE BY MOUTH EVERY DAY AT BEDTIME  [provider]  hydrALAZINE (APRESOLINE) 25 MG tablet Take 1 tablet (25 mg total) by mouth 2 (two) times daily. 08/27/20   Mesner, Barbara Cower, MD  hydrochlorothiazide (HYDRODIURIL) 25 MG tablet Take 1 tablet (25 mg total) by mouth daily. 08/27/20   Mesner, Barbara Cower, MD  naproxen (NAPROSYN) 500 MG tablet Take 1 tablet (500 mg total) by mouth 2 (two) times daily as needed. 02/14/21   Particia Nearing, PA-C  omeprazole (PRILOSEC) 40 MG capsule Take 1 capsule (40 mg total) by mouth daily. 04/05/21   Leslye Peer, MD  topiramate (TOPAMAX) 25 MG tablet TK 4 TS PO HS 01/05/19   [provider]  topiramate (TOPAMAX) 25 MG tablet Take 3 tablets by mouth at bedtime. 01/01/21   [provider]   triamcinolone cream (KENALOG) 0.1 % Apply 1 application topically daily. Dry skin 05/12/17   [provider]  valsartan (DIOVAN) 160 MG tablet Take 1 tablet (160 mg total) by mouth daily. 08/27/20   Mesner, Barbara Cower, MD    Family History Family History  Problem Relation Age of Onset   Hypertension Mother    Colon cancer Mother    Hypertension Father    Hypertension Brother     Social History Social History   Tobacco Use   Smoking status: Never   Smokeless tobacco: Never  Vaping Use   Vaping Use: Never used  Substance Use Topics   Alcohol use: No   Drug use: No     Allergies   Patient has no known allergies.   Review of Systems Review of Systems  Constitutional: Negative.  Negative for appetite change, fatigue and unexpected weight change.  HENT: Negative.    Gastrointestinal: Negative.  Negative for nausea and vomiting.  Musculoskeletal:  Positive for myalgias. Negative for arthralgias and joint swelling.  Skin:  Positive for color change.    Physical Exam Triage Vital Signs ED Triage Vitals  Enc Vitals Group     BP 08/17/21 0929 130/87     Pulse Rate 08/17/21 0929 79     Resp 08/17/21 0929 17     Temp 08/17/21 0929 98.3 F (36.8 C)     Temp Source 08/17/21 0929 Oral     SpO2 08/17/21 0929 98 %     Weight --      Height --      Head Circumference --      Peak Flow --      Pain Score 08/17/21 0931 6     Pain Loc --      Pain Edu? --      Excl. in GC? --    No data found.  Updated Vital Signs BP 130/87 (BP Location: Left Arm)   Pulse 79   Temp 98.3 F (36.8 C) (Oral)   Resp 17   SpO2 98%   Visual Acuity Right Eye Distance:   Left Eye Distance:   Bilateral Distance:    Right Eye Near:   Left Eye Near:    Bilateral Near:     Physical Exam Vitals and nursing note reviewed.  Constitutional:      General: She is not in acute distress.    Appearance: She is not ill-appearing.  Cardiovascular:     Rate and Rhythm: Normal rate and  regular rhythm.     Pulses: Normal pulses.     Heart sounds: Normal heart sounds.  Pulmonary:     Effort: Pulmonary effort is normal.     Breath sounds: Normal  breath sounds.  Skin:    General: Skin is warm.     Findings: Bruising present. No erythema or rash.     Comments: Bruise on the medial aspect of the left mid leg.  No calf tenderness or pain.  Tenderness is confined to the area of bruising.  No other areas of bruising   Neurological:     Mental Status: She is alert.     UC Treatments / Results  Labs (all labs ordered are listed, but only abnormal results are displayed) Labs Reviewed  CBC WITH DIFFERENTIAL/PLATELET  PROTIME-INR    EKG   Radiology No results found.  Procedures Procedures (including critical care time)  Medications Ordered in UC Medications - No data to display  Initial Impression / Assessment and Plan / UC Course  I have reviewed the triage vital signs and the nursing notes.  Pertinent labs & imaging results that were available during my care of the patient were reviewed by me and considered in my medical decision making (see chart for details).     1.  Hematoma of the left leg: CBC, PT/INR Warm compress Tylenol Motrin as needed for pain Gentle range of motion exercises We will call patient with recommendations if labs are abnormal. Final Clinical Impressions(s) / UC Diagnoses   Final diagnoses:  Hematoma     Discharge Instructions      Gentle range of motion exercises Heating pad use as described Tylenol as needed for pain We will call you with recommendations if labs are abnormal.   ED Prescriptions   None    PDMP not reviewed this encounter.   Merrilee Jansky, MD 08/17/21 1015    Merrilee Jansky, MD 08/17/21 [phone]

## 2021-08-17 NOTE — ED Triage Notes (Signed)
Pt presents with bruise on back of left leg X 2 days that is painful with no known injury.

## 2021-08-17 NOTE — Discharge Instructions (Addendum)
Gentle range of motion exercises Heating pad use as described Tylenol as needed for pain We will call you with recommendations if labs are abnormal.

## 2021-08-30 ENCOUNTER — Encounter: Payer: Self-pay | Admitting: Emergency Medicine

## 2021-08-30 ENCOUNTER — Other Ambulatory Visit: Payer: Self-pay

## 2021-08-30 ENCOUNTER — Ambulatory Visit: Payer: BC Managed Care – PPO | Admitting: Emergency Medicine

## 2021-08-30 DIAGNOSIS — J069 Acute upper respiratory infection, unspecified: Secondary | ICD-10-CM | POA: Insufficient documentation

## 2021-08-30 DIAGNOSIS — D869 Sarcoidosis, unspecified: Secondary | ICD-10-CM | POA: Diagnosis not present

## 2021-08-30 NOTE — Assessment & Plan Note (Signed)
Without any evidence of exacerbation of obstructive lung disease.  Recommended that we treat this symptomatically.  I asked her to get the Tylenol Cold and flu and use as directed.

## 2021-08-30 NOTE — Addendum Note (Signed)
Addended by: Dorisann Frames R on: 08/30/2021 02:38 PM   Modules accepted: Orders

## 2021-08-30 NOTE — Assessment & Plan Note (Addendum)
Overall stable.  No wheezing on exam, no crackles.  She had chest discomfort in October that was treated with steroids although unclear whether this was related to sarcoid.  Plan to repeat her surveillance CT chest in January which would be 1 year.  Continue albuterol as needed.  Recommended COVID-19 and flu shots.  She has not wanted these in the past.  Please keep your albuterol label use 2 puffs when needed for shortness of breath, chest tightness, wheezing. We will plan to repeat your CT scan of the chest without contrast in January 2023 to follow your sarcoidosis. Try using over-the-counter Tylenol Cold and flu to relieve your cold symptoms. Call us if your breathing worsens in any way. Consider getting the COVID and flu shots at some point.  You probably benefit from these. Follow with Dr. Delton Coombes in 2 months or sooner if you have any problems.

## 2021-08-30 NOTE — Progress Notes (Signed)
Subjective:    Patient ID: Hannah Ellis, female    DOB: September 09, 1966, 55 y.o.   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  She also has a history of chronic cough.  I last saw her in December 2021 to review her clinical status and CT chest.  That scan showed significant progression of upper lobe predominant clustered nodularity and lymphadenopathy.  Based on this I treated her with prednisone 30 mg daily for 3 weeks, Bactrim prophylaxis.  Repeat high-resolution CT chest 10/06/2020 reviewed by me showed marked improvement in her confluent peribronchovascular nodular disease but not full resolution and still more progressive than 2018. Today she reports that she is experiencing cough, chest tightness, some mid-chest discomfort at night when she is laying down. The cough is non-productive. No nasal congestion or mucous. No fevers. No known sick contacts. Prilosec is on her med list but she says she is not taking. She is out of albuterol, has not been able to use during this episode. No rash.   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  Also with a history of chronic cough.  This is impacted by GERD.  She was treated for acute exacerbation and flare with corticosteroids in mid October > was characterized mainly by pleuritic CP bilaterally.  She has continued to have episodic cough.  She had a neck ultrasound for concern for possible thyroid nodule as below. She is breathing well- is having some nasal congestion, nasal obstruction. Has a URI. She uses albuterol a few times a week. She has not had any rash.  Has not had COVID or flu shots.   Thyroid ultrasound 08/02/2021 reviewed by me, showed no abnormalities, no nodules Chest x-ray 07/13/2021 reviewed by me, shows unchanged bilateral airspace  disease compared with 10/06/2020.   Review of Systems  As per HPI     Objective:   Physical Exam Vitals:   08/30/21 1411  BP: (!) 160/84  Pulse: 87  Temp: 98.1 F (36.7 C)  TempSrc: Oral  SpO2: 99%  Weight: 195 lb 6.4 oz (88.6 kg)  Height: 5\' 4"  (1.626 m)   Gen: Pleasant, well-nourished, in no distress,  normal affect  ENT: No lesions,  mouth clear,  oropharynx clear, no postnasal drip  Neck: No JVD, no stridor  Lungs: No use of accessory muscles, no crackles, no wheezes.   Cardiovascular: RRR, heart sounds normal, no murmur or gallops, no peripheral edema  Musculoskeletal: No deformities, no cyanosis or clubbing  Neuro: alert, non focal  Skin: no rash on the arms or face     Assessment & Plan:  Sarcoidosis (HCC) Overall stable.  No wheezing on exam, no crackles.  She had chest discomfort in October that was treated with steroids although unclear whether this was related to sarcoid.  Plan to repeat her surveillance CT chest in January which would be 1 year.  Continue albuterol as needed.  Recommended COVID-19 and flu shots.  She has not wanted these in the past.  Please keep your albuterol label use 2 puffs when needed for shortness of breath, chest tightness, wheezing. We will plan to repeat your CT scan of the chest without contrast in January 2023 to follow your sarcoidosis. Try using over-the-counter Tylenol Cold and flu to relieve your cold symptoms. Call February 2023 if your breathing worsens in any  way. Consider getting the COVID and flu shots at some point.  You probably benefit from these. Follow with Dr. Delton Coombes in 2 months or sooner if you have any problems.  Viral URI Without any evidence of exacerbation of obstructive lung disease.  Recommended that we treat this symptomatically.  I asked her to get the Tylenol Cold and flu and use as directed.  Levy Pupa, MD, PhD 08/30/2021, 2:24 PM [facility] [phone] or if no answer [phone]

## 2021-08-30 NOTE — Patient Instructions (Addendum)
Please keep your albuterol label use 2 puffs when needed for shortness of breath, chest tightness, wheezing. We will plan to repeat your CT scan of the chest without contrast in January 2023 to follow your sarcoidosis. Try using over-the-counter Tylenol Cold and flu to relieve your cold symptoms. Call us if your breathing worsens in any way. Consider getting the COVID and flu shots at some point.  You probably benefit from these. Follow with Dr. Delton Coombes in 2 months or sooner if you have any problems.

## 2021-09-27 ENCOUNTER — Other Ambulatory Visit: Payer: BC Managed Care – PPO

## 2021-09-28 ENCOUNTER — Other Ambulatory Visit: Payer: BC Managed Care – PPO

## 2021-10-09 ENCOUNTER — Ambulatory Visit
Admission: RE | Admit: 2021-10-09 | Discharge: 2021-10-09 | Disposition: A | Discharge status: Home / Self Care | Payer: BC Managed Care – PPO | Source: Ambulatory Visit | Attending: Emergency Medicine | Admitting: Emergency Medicine

## 2021-10-09 ENCOUNTER — Other Ambulatory Visit: Payer: Self-pay

## 2021-10-09 DIAGNOSIS — D869 Sarcoidosis, unspecified: Secondary | ICD-10-CM

## 2021-10-11 ENCOUNTER — Telehealth: Payer: Self-pay | Admitting: Emergency Medicine

## 2021-10-11 NOTE — Telephone Encounter (Signed)
Dr Delton Coombes- pt scheduled appt for 10/31/21 but is asking for ct results before then. Please advise thanks

## 2021-10-11 NOTE — Telephone Encounter (Signed)
We will ask RB about results but she needs to be made aware that he is unavailable until 10/15/21. Also, no f/u was scheduled for Feb 2023 per last ov AVS so she also needs to make an appt. LMTCB for the pt.

## 2021-10-12 NOTE — Telephone Encounter (Signed)
Called and spoke with patient. She stated that she was returning a call for her CT results from yesterday. I advised her that RB has not yet returned to the office and once we have the results, we will give her a call. She verbalized understanding.

## 2021-10-15 NOTE — Telephone Encounter (Signed)
Please let the pt know that I reviewed her CT chest. It shows stable changes associated with her sarcoidosis. No new findings when compared with her priors. This is good news. Thanks.

## 2021-10-15 NOTE — Telephone Encounter (Signed)
Called and spoke with pt letting her know the results of CT per RB and she verbalized understanding. Nothing further needed.

## 2021-10-31 ENCOUNTER — Ambulatory Visit: Payer: BC Managed Care – PPO | Admitting: Emergency Medicine

## 2021-10-31 ENCOUNTER — Other Ambulatory Visit: Payer: Self-pay

## 2021-10-31 ENCOUNTER — Encounter: Payer: Self-pay | Admitting: Emergency Medicine

## 2021-10-31 DIAGNOSIS — D869 Sarcoidosis, unspecified: Secondary | ICD-10-CM

## 2021-10-31 NOTE — Assessment & Plan Note (Signed)
No evidence clinically of active disease.  Her CT scan of the chest is stable compared with priors.  We will continue to follow annually, check pulmonary function testing and CT chest about every 2 years as long as she remains stable.  She is going to work on getting an ophthalmologist.  She has albuterol available to use if needed.

## 2021-10-31 NOTE — Patient Instructions (Addendum)
We reviewed your CT scan of the chest today.  This is stable compared with your priors.  We will probably plan to repeat every 2 years as long as your breathing remains stable We will plan to repeat your pulmonary function testing in January 2024. Work on getting an ophthalmologist for standard follow-up of your sarcoidosis Keep your albuterol available to use 2 puffs when you needed for shortness of breath, chest tightness, wheezing. Flu shot up-to-date. You would probably benefit from getting the COVID-19 vaccine.  Consider doing this going forward Follow with Dr. Delton Coombes in 12 months or sooner if you have any problems.

## 2021-10-31 NOTE — Progress Notes (Signed)
° °  Subjective:    Patient ID: Hannah Ellis, female    DOB: Oct 12, 1965, 56 y.o.   MRN: [id number]  HPI  ROV 08/30/21 --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  Also with a history of chronic cough.  This is impacted by GERD.  She was treated for acute exacerbation and flare with corticosteroids in mid October > was characterized mainly by pleuritic CP bilaterally.  She has continued to have episodic cough.  She had a neck ultrasound for concern for possible thyroid nodule as below. She is breathing well- is having some nasal congestion, nasal obstruction. Has a URI. She uses albuterol a few times a week. She has not had any rash.  Has not had COVID or flu shots.   Thyroid ultrasound 08/02/2021 reviewed by me, showed no abnormalities, no nodules Chest x-ray 07/13/2021 reviewed by me, shows unchanged bilateral airspace disease compared with 10/06/2020.   ROV 10/31/21 --follow-up visit patient with a history of sarcoidosis with cutaneous and pulmonary manifestations.  She has associated mild obstructive lung disease and history of chronic cough impacted by her GERD. She remains active, works. She does not need albuterol frequently. She does not have have an opthalmologist at this time. No real cough.   CT scan of the chest performed 10/09/2021 reviewed by me, shows no significant interval change in mid upper lobe dominant irregular nodularity with some scattered groundglass in a perilymphatic pattern.   Review of Systems  As per HPI     Objective:   Physical Exam Vitals:   10/31/21 1611  BP: 118/74  Pulse: 80  Temp: 97.8 F (36.6 C)  TempSrc: Oral  SpO2: 97%  Weight: 196 lb 12.8 oz (89.3 kg)  Height: 5\' 4"  (1.626 m)   Gen: Pleasant, well-nourished, in no distress,  normal affect  ENT: No lesions,  mouth clear,  oropharynx clear, no postnasal drip  Neck: No JVD, no stridor  Lungs: No use of accessory  muscles, no crackles, no wheezes.   Cardiovascular: RRR, heart sounds normal, no murmur or gallops, no peripheral edema  Musculoskeletal: No deformities, no cyanosis or clubbing  Neuro: alert, non focal  Skin: no rash on the arms or face     Assessment & Plan:  Sarcoidosis (HCC) No evidence clinically of active disease.  Her CT scan of the chest is stable compared with priors.  We will continue to follow annually, check pulmonary function testing and CT chest about every 2 years as long as she remains stable.  She is going to work on getting an ophthalmologist.  She has albuterol available to use if needed.   , MD, PhD 10/31/2021, 4:24 PM [facility] [phone] or if no answer [phone]

## 2022-04-04 ENCOUNTER — Encounter (HOSPITAL_COMMUNITY): Payer: Self-pay | Admitting: Emergency Medicine

## 2022-04-04 ENCOUNTER — Ambulatory Visit (HOSPITAL_COMMUNITY)
Admission: EM | Admit: 2022-04-04 | Discharge: 2022-04-04 | Disposition: A | Discharge status: Home / Self Care | Payer: BC Managed Care – PPO | Attending: Physician Assistant | Admitting: Physician Assistant

## 2022-04-04 DIAGNOSIS — S39012A Strain of muscle, fascia and tendon of lower back, initial encounter: Secondary | ICD-10-CM | POA: Diagnosis not present

## 2022-04-04 DIAGNOSIS — M545 Low back pain, unspecified: Secondary | ICD-10-CM

## 2022-04-04 MED ORDER — TIZANIDINE HCL 4 MG PO TABS: 4.0000 mg | ORAL_TABLET | Freq: Four times a day (QID) | ORAL | 0 refills | Status: DC | PRN

## 2022-04-04 MED ORDER — IBUPROFEN 600 MG PO TABS: 600.0000 mg | ORAL_TABLET | Freq: Three times a day (TID) | ORAL | 0 refills | Status: DC

## 2022-04-04 NOTE — Discharge Instructions (Addendum)
Advised to take ibuprofen 600 mg 1 every 8 hours with food to help reduce the back pain. Advised to take the Zanaflex 1 every 6-8 hours to help reduce the muscle spasm from the lower back Advised to continue using heat alternating with ice, 10 minutes on 20 minutes off, 4-5 times throughout the day to help reduce the pain and spasm. Advised to follow-up with PCP or return to urgent care if symptoms fail to improve.

## 2022-04-04 NOTE — ED Provider Notes (Signed)
[facility]    CSN: [id number] Arrival date & time: 04/04/22  1517      History   Chief Complaint Chief Complaint  Patient presents with   Back Pain    HPI Hannah Ellis is a 56 y.o. female.   56 year old female presents with lower back pain.  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  Patient indicates for the past couple days she has been having some lower back pain that has tended to increase and become worse the more she continued lifting.  Patient relates today that she is having moderate lower back pain that is localized and the L5-S1 area.  Patient relates the pain tends to be worse when she sits for a prolonged period of time, she has pain when she walks, and pain when she bends over to try to put her socks and shoes on.  Patient relates she does not have any weakness, numbness, or tingling.  Patient relates she is not having any urinary symptoms.  Patient relates no fever or chills.  Patient is presently not taking any medications to help relieve the discomfort.  There has been no trauma to the back.   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   Depression    GERD (gastroesophageal reflux disease)    Hypertension    Pneumonia ~ 2017   Sarcoidosis, lung (HCC)    Stroke (HCC) 05/09/2015   "vs TIA; mild"    TIA (transient ischemic attack) 05/09/2015   "vs CVA; denies residual on 02/17/2017)    Patient Active Problem List   Diagnosis Date Noted   Viral URI 08/30/2021   Neck swelling 07/27/2021   Posterior right knee pain 07/27/2021   GERD (gastroesophageal reflux disease) 04/05/2021   Hypokalemia    Abdominal pain 02/17/2017   Pyelonephritis    Hemifacial spasm 10/29/2016   Sarcoidosis 10/15/2016   Encephalopathy, hypertensive 07/11/2015   Anxiety state 07/11/2015   Essential hypertension 07/11/2015   TIA  (transient ischemic attack) 05/09/2015   Uncontrolled hypertension 05/09/2015   Patient nonadherence 05/09/2015   Microcytic anemia 05/09/2015    Past Surgical History:  Procedure Laterality Date   APPENDECTOMY     CESAREAN SECTION  1998   EYE SURGERY Left ~ 1972   FOOT SURGERY Right    "took some bone out of pinky and one next to it"   TUBAL LIGATION  1998    OB History   No obstetric history on file.      Home Medications    Prior to Admission medications   Medication Sig Start Date End Date Taking? Authorizing Provider  ibuprofen (ADVIL) 600 MG tablet Take 1 tablet (600 mg total) by mouth 3 (three) times daily. 04/04/22  Yes Ellsworth Lennox, PA-C  tiZANidine (ZANAFLEX) 4 MG tablet Take 1 tablet (4 mg total) by mouth every 6 (six) hours as needed for muscle spasms. 04/04/22  Yes Ellsworth Lennox, PA-C  albuterol (VENTOLIN HFA) 108 (90 Base) MCG/ACT inhaler Inhale 1-2 puffs into the lungs every 6 (six) hours as needed for wheezing or shortness of breath. 04/05/21   Leslye Peer, MD  amLODipine (NORVASC) 10 MG tablet Take 1 tablet (10 mg total) by mouth daily. 08/27/20   Mesner, Barbara Cower, MD  aspirin EC 325 MG EC tablet Take 1 tablet (325 mg total) by  mouth daily. 05/11/15   Joseph Art, DO  atorvastatin (LIPITOR) 10 MG tablet TAKE 1 TABLET(10 MG) BY MOUTH DAILY 04/02/21   [provider]  benzonatate (TESSALON) 200 MG capsule Take 1 capsule (200 mg total) by mouth 3 (three) times daily as needed for cough. Patient not taking: Reported on 10/31/2021 03/01/19   Ivonne Andrew, NP  chlorzoxazone (PARAFON) 500 MG tablet chlorzoxazone 500 mg tablet  TAKE 1 TABLET BY MOUTH UP TO FOUR TIMES DAILY AS NEEDED FOR HEADACHE 01/01/21   [provider]  ferrous sulfate 325 (65 FE) MG tablet Take 325 mg by mouth daily with breakfast.    [provider]  gabapentin (NEURONTIN) 300 MG capsule gabapentin 300 mg capsule  TAKE 1 CAPSULE BY MOUTH EVERY DAY AT BEDTIME    [provider]  hydrALAZINE (APRESOLINE) 25 MG tablet Take 1 tablet (25 mg total) by mouth 2 (two) times daily. Patient taking differently: Take 50 mg by mouth 2 (two) times daily. 08/27/20   Mesner, Barbara Cower, MD  hydrALAZINE (APRESOLINE) 50 MG tablet Take 50 mg by mouth 3 (three) times daily. 09/14/21   [provider]  hydrochlorothiazide (HYDRODIURIL) 25 MG tablet Take 1 tablet (25 mg total) by mouth daily. 08/27/20   Mesner, Barbara Cower, MD  omeprazole (PRILOSEC) 40 MG capsule Take 1 capsule (40 mg total) by mouth daily. 04/05/21   Leslye Peer, MD  topiramate (TOPAMAX) 25 MG tablet TK 4 TS PO HS 01/05/19   [provider]  topiramate (TOPAMAX) 25 MG tablet Take 3 tablets by mouth at bedtime. 01/01/21   [provider]  triamcinolone cream (KENALOG) 0.1 % Apply 1 application topically daily. Dry skin 05/12/17   [provider]  valsartan (DIOVAN) 160 MG tablet Take 1 tablet (160 mg total) by mouth daily. 08/27/20   Mesner, Barbara Cower, MD    Family History Family History  Problem Relation Age of Onset   Hypertension Mother    Colon cancer Mother    Hypertension Father    Hypertension Brother     Social History Social History   Tobacco Use   Smoking status: Never   Smokeless tobacco: Never  Vaping Use   Vaping Use: Never used  Substance Use Topics   Alcohol use: No   Drug use: No     Allergies   Patient has no known allergies.   Review of Systems Review of Systems  Musculoskeletal:  Positive for back pain (lower L5/S1 area).     Physical Exam Triage Vital Signs ED Triage Vitals  Enc Vitals Group     BP 04/04/22 1621 (!) 154/91     Pulse Rate 04/04/22 1621 65     Resp 04/04/22 1621 18     Temp 04/04/22 1621 98.2 F (36.8 C)     Temp Source 04/04/22 1621 Oral     SpO2 04/04/22 1621 97 %     Weight --      Height --      Head Circumference --      Peak Flow --      Pain Score 04/04/22 1620 9     Pain Loc --      Pain Edu? --      Excl.  in GC? --    No data found.  Updated Vital Signs BP (!) 154/91 (BP Location: Left Arm)   Pulse 65   Temp 98.2 F (36.8 C) (Oral)   Resp 18   SpO2 97%  Visual Acuity Right Eye Distance:   Left Eye Distance:   Bilateral Distance:    Right Eye Near:   Left Eye Near:    Bilateral Near:     Physical Exam Constitutional:      Appearance: Normal appearance.  Musculoskeletal:     Lumbar back: Spasms (L5/S1 area bilat paraspinous) and tenderness (L5/S1 paraspinous areas bilat) present. Decreased range of motion (limited with pain on walking and bending). Negative right straight leg raise test and negative left straight leg raise test.     Comments: Lower back: Strength is intact and normal bilateral lower extremities  Neurological:     Mental Status: She is alert.      UC Treatments / Results  Labs (all labs ordered are listed, but only abnormal results are displayed) Labs Reviewed - No data to display  EKG   Radiology No results found.  Procedures Procedures (including critical care time)  Medications Ordered in UC Medications - No data to display  Initial Impression / Assessment and Plan / UC Course  I have reviewed the triage vital signs and the nursing notes.  Pertinent labs & imaging results that were available during my care of the patient were reviewed by me and considered in my medical decision making (see chart for details).    Plan: 1.  Advised patient to take ibuprofen 600 mg 1 every 8 hours with food to help decrease the pain lower back. 2.  Advised to take the Zanaflex 1 every 6-8 hours as needed to help reduce muscle spasm. 3.  Advised to continue alternating heat and ice to help reduce the pain and spasm 4.  Advised follow-up PCP or return to urgent care if symptoms fail to improve Final Clinical Impressions(s) / UC Diagnoses   Final diagnoses:  Acute bilateral low back pain without sciatica  Strain of lumbar region, initial encounter      Discharge Instructions      Advised to take ibuprofen 600 mg 1 every 8 hours with food to help reduce the back pain. Advised to take the Zanaflex 1 every 6-8 hours to help reduce the muscle spasm from the lower back Advised to continue using heat alternating with ice, 10 minutes on 20 minutes off, 4-5 times throughout the day to help reduce the pain and spasm. Advised to follow-up with PCP or return to urgent care if symptoms fail to improve.    ED Prescriptions     Medication Sig Dispense Auth. Provider   tiZANidine (ZANAFLEX) 4 MG tablet Take 1 tablet (4 mg total) by mouth every 6 (six) hours as needed for muscle spasms. 30 tablet Ellsworth Lennox, PA-C   ibuprofen (ADVIL) 600 MG tablet Take 1 tablet (600 mg total) by mouth 3 (three) times daily. 30 tablet Ellsworth Lennox, PA-C      PDMP not reviewed this encounter.   Ellsworth Lennox, PA-C 04/04/22 1643

## 2022-04-04 NOTE — ED Triage Notes (Signed)
Pt c/o lower back pains for a few days. Reports that does lifting at work.

## 2022-04-07 ENCOUNTER — Other Ambulatory Visit: Payer: Self-pay | Admitting: Emergency Medicine

## 2022-04-09 ENCOUNTER — Telehealth (HOSPITAL_COMMUNITY): Payer: Self-pay | Admitting: Emergency Medicine

## 2022-04-09 NOTE — Telephone Encounter (Signed)
Patient called looking for physical restrictions related to her recent visit.  Reviewed with Onalee Hua, APP who states patient will need to follow-up with Orthopedic if she is needing specific restrictions  PAtient verbalized understanding

## 2022-06-12 ENCOUNTER — Ambulatory Visit (INDEPENDENT_AMBULATORY_CARE_PROVIDER_SITE_OTHER): Payer: BC Managed Care – PPO

## 2022-06-12 ENCOUNTER — Encounter: Payer: Self-pay | Admitting: Emergency Medicine

## 2022-06-12 ENCOUNTER — Ambulatory Visit (INDEPENDENT_AMBULATORY_CARE_PROVIDER_SITE_OTHER): Payer: BC Managed Care – PPO | Admitting: Emergency Medicine

## 2022-06-12 DIAGNOSIS — R051 Acute cough: Secondary | ICD-10-CM

## 2022-06-12 DIAGNOSIS — D869 Sarcoidosis, unspecified: Secondary | ICD-10-CM

## 2022-06-12 DIAGNOSIS — K219 Gastro-esophageal reflux disease without esophagitis: Secondary | ICD-10-CM

## 2022-06-12 MED ORDER — LORATADINE 10 MG PO TABS: 10.0000 mg | ORAL_TABLET | Freq: Every day | ORAL | 11 refills | Status: DC

## 2022-06-12 MED ORDER — BENZONATATE 200 MG PO CAPS: 200.0000 mg | ORAL_CAPSULE | Freq: Three times a day (TID) | ORAL | 1 refills | Status: DC | PRN

## 2022-06-12 MED ORDER — PREDNISONE 10 MG PO TABS: ORAL_TABLET | ORAL | 0 refills | Status: DC

## 2022-06-12 NOTE — Addendum Note (Signed)
Addended by: Gavin Potters R on: 06/12/2022 11:22 AM   Modules accepted: Orders

## 2022-06-12 NOTE — Progress Notes (Signed)
Subjective:    Patient ID: Hannah Ellis, female    DOB: 10-Jul-1966, 56 y.o.   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  Also with a history of chronic cough.  This is impacted by GERD.  She was treated for acute exacerbation and flare with corticosteroids in mid October > was characterized mainly by pleuritic CP bilaterally.  She has continued to have episodic cough.  She had a neck ultrasound for concern for possible thyroid nodule as below. She is breathing well- is having some nasal congestion, nasal obstruction. Has a URI. She uses albuterol a few times a week. She has not had any rash.  Has not had COVID or flu shots.   Thyroid ultrasound 08/02/2021 reviewed by me, showed no abnormalities, no nodules Chest x-ray 07/13/2021 reviewed by me, shows unchanged bilateral airspace disease compared with 10/06/2020.   ROV 10/31/21 --follow-up visit patient with a history of sarcoidosis with cutaneous and pulmonary manifestations.  She has associated mild obstructive lung disease and history of chronic cough impacted by her GERD. She remains active, works. She does not need albuterol frequently. She does not have have an opthalmologist at this time. No real cough.   CT scan of the chest performed 10/09/2021 reviewed by me, shows no significant interval change in mid upper lobe dominant irregular nodularity with some scattered groundglass in a perilymphatic pattern.  ROV 06/12/22 --Hannah Ellis is 70 and has a history of sarcoidosis with both pulmonary and cutaneous manifestations.  She has associated mild obstructive lung disease, deals with chronic cough that is often impacted by her GERD.  Her most recent CT chest was January 2023, stable as above.  Currently managed on albuterol as needed.  She is on omeprazole 40 mg daily She has been experiencing dry cough for 3-4 days, waking her  from sleep and present all through the days. She has used albuterol without any response. She is not having any GERD breakthrough. No real inciting event or URI sx.    Review of Systems  As per HPI     Objective:   Physical Exam Vitals:   06/12/22 1100  BP: 128/76  Pulse: 86  Temp: 97.9 F (36.6 C)  TempSrc: Oral  SpO2: 99%  Weight: 197 lb 12.8 oz (89.7 kg)  Height: 5\' 4"  (1.626 m)   Gen: Pleasant, well-nourished, in no distress,  normal affect-frequent coughing  ENT: No lesions,  mouth clear,  oropharynx clear, no postnasal drip  Neck: No JVD, no stridor  Lungs: No use of accessory muscles, no crackles, no wheezes.   Cardiovascular: RRR, heart sounds normal, no murmur or gallops, no peripheral edema  Musculoskeletal: No deformities, no cyanosis or clubbing  Neuro: alert, non focal  Skin: no rash on the arms or face     Assessment & Plan:  Acute cough History of chronic cough, this acute episode began about 4 days ago without any clear precipitant.  Her GERD sounds like it is well controlled.  She has flared in the fall before, question impact of allergic disease.  Clear lungs on exam, sounds like this is principally upper airway in nature.  I will check a chest x-ray to ensure no overt changes given her sarcoidosis.  Try treating her acute flare with prednisone, continued GERD therapy, add loratadine  Chest x-ray today Please take prednisone as directed until completely gone. Keep your albuterol  available to use 2 puffs if needed for shortness of breath, chest tightness, wheezing. We will refill your Tessalon.  You can use 1 up to every 6 hours if you need it for cough suppression Try using over-the-counter Delsym every 12 hours if needed for cough suppression Continue your omeprazole 40 mg daily as you have been taking it. Try starting loratadine 10 mg once daily. Follow with APP in 2 to 3 weeks to ensure that this episode is improving. Follow with Dr Delton Coombes in 6  months or sooner if you have any problems  Sarcoidosis (HCC) No wheezing on exam.  I will check a chest x-ray today but I think this is an upper airway flare, not necessarily associated with her sarcoid.  Our current plan is to follow her PFT and imaging intermittently, every 1 to 2 years.  If symptoms persist after prednisone then we may need to consider testing sooner.  GERD (gastroesophageal reflux disease) Omeprazole as she has been taking it   Levy Pupa, MD, PhD 06/12/2022, 11:18 AM [facility] [phone] or if no answer [phone]

## 2022-06-12 NOTE — Assessment & Plan Note (Signed)
No wheezing on exam.  I will check a chest x-ray today but I think this is an upper airway flare, not necessarily associated with her sarcoid.  Our current plan is to follow her PFT and imaging intermittently, every 1 to 2 years.  If symptoms persist after prednisone then we may need to consider testing sooner.

## 2022-06-12 NOTE — Assessment & Plan Note (Signed)
Omeprazole as she has been taking it

## 2022-06-12 NOTE — Assessment & Plan Note (Signed)
History of chronic cough, this acute episode began about 4 days ago without any clear precipitant.  Her GERD sounds like it is well controlled.  She has flared in the fall before, question impact of allergic disease.  Clear lungs on exam, sounds like this is principally upper airway in nature.  I will check a chest x-ray to ensure no overt changes given her sarcoidosis.  Try treating her acute flare with prednisone, continued GERD therapy, add loratadine  Chest x-ray today Please take prednisone as directed until completely gone. Keep your albuterol available to use 2 puffs if needed for shortness of breath, chest tightness, wheezing. We will refill your Tessalon.  You can use 1 up to every 6 hours if you need it for cough suppression Try using over-the-counter Delsym every 12 hours if needed for cough suppression Continue your omeprazole 40 mg daily as you have been taking it. Try starting loratadine 10 mg once daily. Follow with APP in 2 to 3 weeks to ensure that this episode is improving. Follow with Dr Lamonte Sakai in 6 months or sooner if you have any problems

## 2022-06-12 NOTE — Patient Instructions (Signed)
Chest x-ray today Please take prednisone as directed until completely gone. Keep your albuterol available to use 2 puffs if needed for shortness of breath, chest tightness, wheezing. We will refill your Tessalon.  You can use 1 up to every 6 hours if you need it for cough suppression Try using over-the-counter Delsym every 12 hours if needed for cough suppression Continue your omeprazole 40 mg daily as you have been taking it. Try starting loratadine 10 mg once daily. Follow with APP in 2 to 3 weeks to ensure that this episode is improving. Follow with Dr Lamonte Sakai in 6 months or sooner if you have any problems

## 2022-06-14 ENCOUNTER — Telehealth: Payer: Self-pay | Admitting: Emergency Medicine

## 2022-06-14 NOTE — Telephone Encounter (Signed)
Called and spoke with pt who was requesting to know the results of her recent cxr. Dr. Lamonte Sakai, please advise.

## 2022-06-17 ENCOUNTER — Telehealth: Payer: Self-pay | Admitting: Emergency Medicine

## 2022-06-17 MED ORDER — HYDROCODONE BIT-HOMATROP MBR 5-1.5 MG/5ML PO SOLN: 5.0000 mL | Freq: Four times a day (QID) | ORAL | 0 refills | Status: DC | PRN

## 2022-06-17 NOTE — Telephone Encounter (Signed)
Called and left detailed msg on machine for the pt with results ok per DPR.

## 2022-06-17 NOTE — Telephone Encounter (Signed)
I sent a script for hycodan to her pharmacy.

## 2022-06-17 NOTE — Telephone Encounter (Signed)
Please let her know that her CXR is stable compared with her priors - good news.

## 2022-06-17 NOTE — Telephone Encounter (Signed)
Spoke with the pt and notified of response per Dr Byrum  °Pt verbalized understanding  °Nothing further needed °

## 2022-06-17 NOTE — Telephone Encounter (Signed)
Spoke with the pt  She was seen 06/12/22 and given the following recs:  Instructions  Chest x-ray today Please take prednisone as directed until completely gone. Keep your albuterol available to use 2 puffs if needed for shortness of breath, chest tightness, wheezing. We will refill your Tessalon.  You can use 1 up to every 6 hours if you need it for cough suppression Try using over-the-counter Delsym every 12 hours if needed for cough suppression Continue your omeprazole 40 mg daily as you have been taking it. Try starting loratadine 10 mg once daily. Follow with APP in 2 to 3 weeks to ensure that this episode is improving. Follow with Dr Lamonte Sakai in 6 months or sooner if you have any problems      She states that her cough is not any better despite recs- she's taking tessalon, albuterol, delsym, loratidine, omeprazole and is on 30 mg pred. She states unable to cough up anything. Her chest discomfort is when she coughs only. Denies any increased SOB, wheezing, fever. She is asking if there is anything else that we can rec. She has already taken entire bottle of delsym. Please advise, thanks  No Known Allergies

## 2022-06-27 ENCOUNTER — Ambulatory Visit (INDEPENDENT_AMBULATORY_CARE_PROVIDER_SITE_OTHER): Payer: 59 | Admitting: Primary Care

## 2022-06-27 ENCOUNTER — Encounter: Payer: Self-pay | Admitting: Primary Care

## 2022-06-27 ENCOUNTER — Telehealth: Payer: Self-pay | Admitting: Emergency Medicine

## 2022-06-27 DIAGNOSIS — K219 Gastro-esophageal reflux disease without esophagitis: Secondary | ICD-10-CM

## 2022-06-27 DIAGNOSIS — D869 Sarcoidosis, unspecified: Secondary | ICD-10-CM

## 2022-06-27 DIAGNOSIS — J209 Acute bronchitis, unspecified: Secondary | ICD-10-CM | POA: Diagnosis not present

## 2022-06-27 MED ORDER — DOXYCYCLINE HYCLATE 100 MG PO TABS: 100.0000 mg | ORAL_TABLET | Freq: Two times a day (BID) | ORAL | 0 refills | Status: DC

## 2022-06-27 NOTE — Assessment & Plan Note (Addendum)
-   Patient with sarcoidosis. She developed np cough 2-3 weeks ago. Some improvement with prednisone. Continues to have persistent congested cough with associated PND. CXR 06/12/22 showed sarcoidosis findings with possible increased opacities within the right upper lung. We will send in RX Doxycycline 100mg  BID x 7 days. Advised she start mucinex-dm 600mg  1 tab twice daily x 7 days along with fluticasone nasal spray. Continue albuterol hfa 2 puffs every 4-6 hours for sob/wheezing. If cough does not improve in 1-2 weeks advised she return to office.

## 2022-06-27 NOTE — Assessment & Plan Note (Signed)
Continue omeprazole 40 mg daily

## 2022-06-27 NOTE — Patient Instructions (Addendum)
Recommendations Starting flonase 7-10 days (over the counter) Starting mucinex-dm twice a day (over the counter) Start doxycycline 1 tab twice daily x 7 days ( take with food and wear sunscreen)  Follow-up: Call if cough is not better in 1 -2  weeks

## 2022-06-27 NOTE — Progress Notes (Signed)
@Patient  ID: , female    DOB: 15-Apr-1966, 56 y.o.   MRN: [id number]  Chief Complaint  Patient presents with   Follow-up    Referring provider: [id number], FNP  HPI: 56 year old female, never smoked. PMH significant for HTN, GERD, sarcoidosis. Patient of Dr. 59.   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  Also with a history of chronic cough.  This is impacted by GERD.  She was treated for acute exacerbation and flare with corticosteroids in mid October > was characterized mainly by pleuritic CP bilaterally.  She has continued to have episodic cough.  She had a neck ultrasound for concern for possible thyroid nodule as below. She is breathing well- is having some nasal congestion, nasal obstruction. Has a URI. She uses albuterol a few times a week. She has not had any rash.  Has not had COVID or flu shots.   Thyroid ultrasound 08/02/2021 reviewed by me, showed no abnormalities, no nodules Chest x-ray 07/13/2021 reviewed by me, shows unchanged bilateral airspace disease compared with 10/06/2020.   ROV 10/31/21 --follow-up visit patient with a history of sarcoidosis with cutaneous and pulmonary manifestations.  She has associated mild obstructive lung disease and history of chronic cough impacted by her GERD. She remains active, works. She does not need albuterol frequently. She does not have have an opthalmologist at this time. No real cough.   CT scan of the chest performed 10/09/2021 reviewed by me, shows no significant interval change in mid upper lobe dominant irregular nodularity with some scattered groundglass in a perilymphatic pattern.  ROV 06/12/22 --Hannah Ellis is 29 and has a history of sarcoidosis with both pulmonary and cutaneous manifestations.  She has associated mild obstructive lung disease, deals with chronic cough that is  often impacted by her GERD.  Her most recent CT chest was January 2023, stable as above.  Currently managed on albuterol as needed.  She is on omeprazole 40 mg daily She has been experiencing dry cough for 3-4 days, waking her from sleep and present all through the days. She has used albuterol without any response. She is not having any GERD breakthrough. No real inciting event or URI sx.   06/27/2022- Interim hx  Patient presents today for follow-up. She was seen on 06/12/22 for acute visit d/t cough, treated with prednisone taper. CXR showed sarcoidosis findings with possible increased opacities within the right upper lung. She is feeling some better but still has persistent cough for the last 2-3 weeks. Cough is non-productive. Associated chest congestion and PND. On prn albuterol and omeprazole 40mg  daily. CT in Jan 2023 was stable. Denies fever, chills, chest pain, wheezing or shortness of breath.    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   Depression    GERD (gastroesophageal reflux disease)    Hypertension    Pneumonia ~ 2017   Sarcoidosis, lung (HCC)    Stroke (HCC) 05/09/2015   "vs TIA;  mild"    TIA (transient ischemic attack) 05/09/2015   "vs CVA; denies residual on 02/17/2017)    Tobacco History: Social History   Tobacco Use  Smoking Status Never  Smokeless Tobacco Never   Counseling given: Not Answered   Outpatient Medications Prior to Visit  Medication Sig Dispense Refill   albuterol (VENTOLIN HFA)  108 (90 Base) MCG/ACT inhaler Inhale 1-2 puffs into the lungs every 6 (six) hours as needed for wheezing or shortness of breath. 1 each 0   amLODipine (NORVASC) 10 MG tablet Take 1 tablet (10 mg total) by mouth daily. 30 tablet 1   aspirin EC 325 MG EC tablet Take 1 tablet (325 mg total) by mouth daily. 30 tablet 0   atorvastatin (LIPITOR) 10 MG tablet TAKE 1 TABLET(10 MG) BY MOUTH DAILY     benzonatate (TESSALON) 200 MG capsule Take 1 capsule (200 mg total) by mouth 3 (three) times daily as needed for cough. 30 capsule 1   chlorzoxazone (PARAFON) 500 MG tablet chlorzoxazone 500 mg tablet  TAKE 1 TABLET BY MOUTH UP TO FOUR TIMES DAILY AS NEEDED FOR HEADACHE     ferrous sulfate 325 (65 FE) MG tablet Take 325 mg by mouth daily with breakfast.     gabapentin (NEURONTIN) 300 MG capsule gabapentin 300 mg capsule  TAKE 1 CAPSULE BY MOUTH EVERY DAY AT BEDTIME     hydrALAZINE (APRESOLINE) 25 MG tablet Take 1 tablet (25 mg total) by mouth 2 (two) times daily. (Patient taking differently: Take 50 mg by mouth 2 (two) times daily.) 60 tablet 1   hydrALAZINE (APRESOLINE) 50 MG tablet Take 50 mg by mouth 3 (three) times daily.     hydrochlorothiazide (HYDRODIURIL) 25 MG tablet Take 1 tablet (25 mg total) by mouth daily. 30 tablet 1   HYDROcodone bit-homatropine (HYCODAN) 5-1.5 MG/5ML syrup Take 5 mLs by mouth every 6 (six) hours as needed for cough. 240 mL 0   ibuprofen (ADVIL) 600 MG tablet Take 1 tablet (600 mg total) by mouth 3 (three) times daily. 30 tablet 0   loratadine (CLARITIN) 10 MG tablet Take 1 tablet (10 mg total) by mouth daily. 30 tablet 11   omeprazole (PRILOSEC) 40 MG capsule TAKE 1 CAPSULE(40 MG) BY MOUTH DAILY 30 capsule 5   tiZANidine (ZANAFLEX) 4 MG tablet Take 1 tablet (4 mg total) by mouth every 6 (six) hours as needed for muscle spasms. 30 tablet 0   topiramate (TOPAMAX) 25 MG tablet TK 4 TS PO HS     topiramate (TOPAMAX) 25 MG tablet Take 3 tablets by mouth at bedtime.      triamcinolone cream (KENALOG) 0.1 % Apply 1 application topically daily. Dry skin  0   valsartan (DIOVAN) 160 MG tablet Take 1 tablet (160 mg total) by mouth daily. 30 tablet 1   predniSONE (DELTASONE) 10 MG tablet Take 4 tablets X 3 days, 3 tabs X 3 days, 2 tabs x 3 days, 1 tab x 3 days 30 tablet 0   No facility-administered medications prior to visit.   Review of Systems  Review of Systems  Constitutional: Negative.   HENT:  Positive for congestion.   Respiratory:  Positive for cough. Negative for chest tightness and wheezing.   Cardiovascular: Negative.    Physical Exam  BP (!) 143/94 (BP Location: Left Arm, Patient Position: Sitting, Cuff Size: Large)   Pulse 85   Temp 98.5 F (36.9 C) (Oral)   Ht 5\' 4"  (1.626 m)   Wt 199  lb 3.2 oz (90.4 kg)   SpO2 99%   BMI 34.19 kg/m  Physical Exam Constitutional:      Appearance: Normal appearance.  HENT:     Head: Normocephalic and atraumatic.     Mouth/Throat:     Mouth: Mucous membranes are moist.     Pharynx: Oropharynx is clear.  Cardiovascular:     Rate and Rhythm: Normal rate and regular rhythm.  Pulmonary:     Effort: Pulmonary effort is normal.     Breath sounds: Normal breath sounds. No wheezing, rhonchi or rales.     Comments: Frequent throat clearing/congested cough Musculoskeletal:        General: Normal range of motion.  Skin:    General: Skin is warm and dry.  Neurological:     General: No focal deficit present.     Mental Status: She is alert and oriented to person, place, and time. Mental status is at baseline.  Psychiatric:        Mood and Affect: Mood normal.        Thought Content: Thought content normal.        Judgment: Judgment normal.      Lab Results:  CBC    Component Value Date/Time   WBC 7.2 08/17/2021 0947   RBC 5.91 (H) 08/17/2021 0947   HGB 15.9 (H) 08/17/2021 0947   HGB 13.1 10/29/2016 1124   HCT 50.0 (H) 08/17/2021 0947   HCT 41.7 10/29/2016 1124   PLT 181 08/17/2021 0947   PLT  226 10/29/2016 1124   MCV 84.6 08/17/2021 0947   MCV 80 10/29/2016 1124   MCH 26.9 08/17/2021 0947   MCHC 31.8 08/17/2021 0947   RDW 14.4 08/17/2021 0947   RDW 16.1 (H) 10/29/2016 1124   LYMPHSABS 1.4 08/17/2021 0947   MONOABS 0.4 08/17/2021 0947   EOSABS 0.1 08/17/2021 0947   BASOSABS 0.0 08/17/2021 0947    BMET    Component Value Date/Time   NA 140 08/26/2020 1338   NA 141 10/29/2016 1124   K 4.2 08/26/2020 1338   CL 105 08/26/2020 1338   CO2 27 08/26/2020 1338   GLUCOSE 87 08/26/2020 1338   BUN 17 08/26/2020 1338   BUN 10 10/29/2016 1124   CREATININE 1.13 (H) 08/26/2020 1338   CALCIUM 9.9 08/26/2020 1338   GFRNONAA 58 (L) 08/26/2020 1338   GFRAA >60 02/11/2018 1916    BNP No results found for: "BNP"  ProBNP No results found for: "PROBNP"  Imaging: DG Chest 2 View  Result Date: 06/14/2022 CLINICAL DATA:  History of sarcoid EXAM: CHEST - 2 VIEW COMPARISON:  Chest radiograph 07/13/2021; chest CT 10/09/2021 FINDINGS: Stable cardiac and mediastinal contours. Stable bilateral right mid and upper lung and left mid lung patchy areas of consolidation. Possible mild increased patchy opacities within the right upper lung. No pleural effusion or pneumothorax. Osseous structures unremarkable. IMPRESSION: Redemonstrated bilateral findings compatible with history of sarcoidosis. Possible increased opacities within the right upper lung. Electronically Signed   By: Annia Belt M.D.   On: 06/14/2022 06:46     Assessment & Plan:   Acute bronchitis - Patient with sarcoidosis. She developed np cough 2-3 weeks ago. Some improvement with prednisone. Continues to have persistent congested cough with associated PND. CXR 06/12/22 showed sarcoidosis findings with possible increased opacities within the right upper lung. We will send in RX Doxycycline 100mg  BID x 7 days. Advised she start mucinex-dm 600mg  1 tab twice daily x 7 days along with fluticasone  nasal spray. Continue albuterol hfa 2 puffs  every 4-6 hours for sob/wheezing. If cough does not improve in 1-2 weeks advised she return to office.   GERD (gastroesophageal reflux disease) - Continue omeprazole 40mg  daily   Sarcoidosis ([place]) - Cough/chest discomfort last 2 weeks. Responded to prednisone. CT chest in January was stable.    Martyn Ehrich, NP 06/27/2022

## 2022-06-27 NOTE — Telephone Encounter (Signed)
Based on her PFTs and her CT chest, her sarcoidosis is stage 2 (stage 1 is with a clear CXR).

## 2022-06-27 NOTE — Telephone Encounter (Signed)
Dr. Lamonte Sakai, please advise on this for pt about what stage of sarcoidosis she is at.

## 2022-06-27 NOTE — Assessment & Plan Note (Signed)
-   Cough/chest discomfort last 2 weeks. Responded to prednisone. CT chest in January was stable.

## 2022-06-28 ENCOUNTER — Telehealth: Payer: Self-pay | Admitting: Emergency Medicine

## 2022-06-28 NOTE — Telephone Encounter (Signed)
I called the patient and let her know this is part of the disease process and the provider will continue to monitor. She voices understanding. She did not have any other questions. Nothing further needed.

## 2022-06-28 NOTE — Telephone Encounter (Signed)
ATC LVMTCB x 1  

## 2022-06-28 NOTE — Telephone Encounter (Signed)
Pt returned call and I relayed to her the info from [facility] about the stage sarcoid she had. Pt verbalized understanding. Nothing further needed.

## 2022-07-01 ENCOUNTER — Encounter: Payer: Self-pay | Admitting: Internal Medicine

## 2022-07-01 ENCOUNTER — Ambulatory Visit (INDEPENDENT_AMBULATORY_CARE_PROVIDER_SITE_OTHER): Payer: 59 | Admitting: Internal Medicine

## 2022-07-01 ENCOUNTER — Telehealth: Payer: Self-pay | Admitting: Emergency Medicine

## 2022-07-01 VITALS — BP 146/78 | HR 97 | Temp 98.9°F | Ht 64.0 in | Wt 198.0 lb

## 2022-07-01 DIAGNOSIS — M79605 Pain in left leg: Secondary | ICD-10-CM | POA: Diagnosis not present

## 2022-07-01 DIAGNOSIS — J189 Pneumonia, unspecified organism: Secondary | ICD-10-CM | POA: Diagnosis not present

## 2022-07-01 DIAGNOSIS — M79604 Pain in right leg: Secondary | ICD-10-CM

## 2022-07-01 NOTE — Patient Instructions (Addendum)
Please schedule for follow up for 6 weeks with Dr. Lamonte Sakai or APP.   We will get a chest xray before your next appointment to make sure your pneumonia has cleared up.   Start taking tylenol or ibuprofen over the counter for your leg pain. Could also try heating pads.  Finish the course of antibiotics as prescribed.

## 2022-07-01 NOTE — Telephone Encounter (Signed)
Patient returning call.

## 2022-07-01 NOTE — Progress Notes (Signed)
Hannah Ellis    [id number]    08-28-1966  Primary Care Physician:Anderson, Ovidio Kin, FNP Date of Appointment: 07/01/2022 Established Patient Visit  Chief complaint:   Chief Complaint  Patient presents with   Acute Visit    Leg and neck pain, Left sided, small amount of cough      HPI: Hannah Ellis is a 56 y.o. woman with history of chronic cough, pulmonary sarcoidosis, GERD.  Interval Updates: Here for acute visit. Treated with prednisone taper by RB and given tessalon and hycodan cough syrup.  She was also started on anti-histamine with claritin. Saw BW two weeks later and started on doxycycline and mucinex and flonase. Cough felt to be related to post nasal drainage.   She is now back 4 days later for leg pain which started two days ago. She feels like she couldn't get out of bed. She has never had pain like this before. No weakness, no paresthesias. She was concerned it could be related to her sarcoid.  Denies post nasal drainage. No fevers.   I have reviewed the patient's family social and past medical history and updated as appropriate.   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   Depression    GERD (gastroesophageal reflux disease)    Hypertension    Pneumonia ~ 2017   Sarcoidosis, lung ([facility])    Stroke ([facility]) 05/09/2015   "vs TIA; mild"    TIA (transient ischemic attack) 05/09/2015   "vs CVA; denies residual on 02/17/2017)    Past Surgical History:  Procedure Laterality Date   [place] Left ~ [place] Neck Right    "took some bone out of pinky and one next to it"   [place]    Family History  Problem Relation Age of Onset   Hypertension Mother    Colon cancer Mother    Hypertension Father    Hypertension Brother     Social History   Occupational History   Not on file  Tobacco Use   Smoking status:  Never   Smokeless tobacco: Never  Vaping Use   Vaping Use: Never used  Substance and Sexual Activity   Alcohol use: No   Drug use: No   Sexual activity: Yes     Physical Exam: Blood pressure (!) 146/78, pulse 97, temperature 98.9 F (37.2 C), temperature source Oral, height 5\' 4"  (1.626 m), weight 198 lb (89.8 kg), SpO2 97 %.  Gen:      No acute distress ENT:  no nasal polyps, mucus membranes moist Lungs:    No increased respiratory effort, symmetric chest wall excursion, clear to auscultation bilaterally, no wheezes or crackles CV:         Regular rate and rhythm; no murmurs, rubs, or gallops.  No pedal edema Ext: mild tenderness to palpation of the thighs Neuro: no motor weakness or sensory deficity of lower extremitiies  Data Reviewed: Imaging: I have personally reviewed the chest xray obtained last week shows patchy multifocal bilataral airspace opacities  PFTs:     Latest Ref Rng & Units 09/30/2017   12:25 PM  PFT Results  FVC-Pre L 2.67   FVC-Predicted Pre % 93   FVC-Post L 2.66   FVC-Predicted Post % 92   Pre FEV1/FVC % %  74   Post FEV1/FCV % % 73   FEV1-Pre L 1.96   FEV1-Predicted Pre % 85   FEV1-Post L 1.95   DLCO uncorrected ml/min/mmHg 16.12   DLCO UNC% % 66   DLCO corrected ml/min/mmHg 18.21   DLCO COR %Predicted % 75   DLVA Predicted % 99   TLC L 3.96   TLC % Predicted % 78   RV % Predicted % 82    I have personally reviewed the patient's PFTs and mild restriction to ventilation.   Labs:  Immunization status: Immunization History  Administered Date(s) Administered   Influenza Inj Mdck Quad Pf 07/17/2016, 06/20/2017, 09/14/2021   Influenza,inj,Quad PF,6+ Mos 07/09/2018   Influenza,inj,Quad PF,6-35 Mos 06/24/2019   Influenza,inj,quad, With Preservative 07/17/2016, 06/20/2017   Influenza-Unspecified 07/09/2018, 06/24/2019   Pneumococcal Polysaccharide-23 07/17/2016   Tdap 10/17/2016    External Records Personally Reviewed:  pulmonary  Assessment:  Possible CAP Pulmonary Sarcoidosis Leg pain    Plan/Recommendations: Complete course of abx for presumed infection. Will need repeat chest xray in 6 weeks. If not improved consider CT Chest/repeat PFTS for progression of sarcoid.  For leg pain recommend trying OTC ibuprofen/acetaminophen with heating pad. If no improvement follow up with PCP.    Return to Care: Return in about 6 weeks (around 08/12/2022).   Durel Salts, MD Pulmonary and Critical Care Medicine [facility] [phone]

## 2022-07-01 NOTE — Telephone Encounter (Signed)
ATC patient. LVMTCB. 

## 2022-07-01 NOTE — Telephone Encounter (Signed)
Called and spoke with pt letting her know info per Joellen Jersey and she verbalized understanding. Scheduled pt an appt with Dr. Shearon Stalls today for further evaluation. Nothing further needed.

## 2022-07-01 NOTE — Telephone Encounter (Signed)
I already have two double booked slots today so I'm full. Please see if there is another provider with an available opening. Otherwise, I would recommend she go to urgent care for further evaluation. Thanks.

## 2022-07-01 NOTE — Telephone Encounter (Signed)
Called and spoke with pt who states that she has had pain all over her body which began 2 days ago. Stated that it started in her legs and then would move to another part of her body and she is not sure why.  Asked pt if she was running any fever and she said not that she is aware of.  Pt is still currently on doxycycline abx which was prescribed at last OV 10/5. Pt states that she has not tried any other meds.  At last OV, Beth said if pt was not any better in 1-2 weeks to return to the office and pt said that she is still coughing and experiencing the pain in her side. With Beth being out of the office, sending this to Joellen Jersey to see if we can use a 1:00 slot one day this week for pt to be seen. Katie, please advise.

## 2022-07-03 ENCOUNTER — Telehealth: Payer: Self-pay | Admitting: Emergency Medicine

## 2022-07-03 NOTE — Telephone Encounter (Signed)
Called and spoke with patient. She stated that she was told she had PNA at her visit on 10/09. She is almost finished with her doxy. Denied having a fever or SOB. She still has a cough but it is non-productive. She overall feels great except for the pain she has on the left side.   She went back to work after seeing Dr. Shearon Stalls. She mentioned to her manager that she has PNA and they advised her that she needs to stay.   I advised her that her PNA is not contagious but if she is feeling bad, she should stay home from work. She verbalized understanding. She wanted to know what RB recommends.   RB, can you please advise? Thanks!

## 2022-07-03 NOTE — Telephone Encounter (Signed)
Patient would like the nurse to call her because she stated she was diagnosed with pneumonia and would like to know if she could go to work.  She stated that she is almost finished with the medication but did not know if she could still work.  Please advise and call patient to discuss further at [phone]

## 2022-07-03 NOTE — Telephone Encounter (Signed)
Patient returning call.

## 2022-07-03 NOTE — Telephone Encounter (Signed)
Called patient, she answered but could not hear anything. Will call back later.

## 2022-07-04 NOTE — Telephone Encounter (Signed)
I agree that it is ok for her to go back to work if she feels up to it

## 2022-07-04 NOTE — Telephone Encounter (Signed)
Called and spoke with patient. She verbalized understanding.   Nothing further needed at time of call.  

## 2022-07-08 ENCOUNTER — Telehealth: Payer: Self-pay | Admitting: Emergency Medicine

## 2022-07-08 NOTE — Telephone Encounter (Signed)
I believe she needs to be seen to determine any imaging that is appropriate and whether she needs a more extended course steroids.

## 2022-07-08 NOTE — Telephone Encounter (Signed)
Pharmacy: [facility] on [address]

## 2022-07-08 NOTE — Telephone Encounter (Signed)
Spoke with pt and scheduled for 07/11/22 at 1:45 pm. Nothing further needed at this time.

## 2022-07-08 NOTE — Telephone Encounter (Signed)
Spoke with the pt  She states having pain in both legs (no swelling) and left rib pain x 3 days  She is coughing some, but this has been better than usual for her  She just finished doxy and has been off of pred for about 2 wks  She denies any increased SOB, wheezing, fevers  She has had diarrhea for the past 2 days   Pt asking for recs, no appts open today  Please advise, thanks!  No Known Allergies

## 2022-07-11 ENCOUNTER — Encounter: Payer: Self-pay | Admitting: Emergency Medicine

## 2022-07-11 ENCOUNTER — Ambulatory Visit (HOSPITAL_COMMUNITY)
Admission: RE | Admit: 2022-07-11 | Discharge: 2022-07-11 | Disposition: A | Discharge status: Home / Self Care | Payer: 59 | Source: Ambulatory Visit | Attending: Emergency Medicine | Admitting: Emergency Medicine

## 2022-07-11 ENCOUNTER — Other Ambulatory Visit: Payer: Self-pay | Admitting: Emergency Medicine

## 2022-07-11 ENCOUNTER — Ambulatory Visit (INDEPENDENT_AMBULATORY_CARE_PROVIDER_SITE_OTHER): Payer: 59 | Admitting: Emergency Medicine

## 2022-07-11 VITALS — BP 138/76 | HR 83 | Temp 98.9°F | Ht 64.0 in | Wt 199.0 lb

## 2022-07-11 DIAGNOSIS — M791 Myalgia, unspecified site: Secondary | ICD-10-CM | POA: Diagnosis not present

## 2022-07-11 DIAGNOSIS — R053 Chronic cough: Secondary | ICD-10-CM

## 2022-07-11 DIAGNOSIS — R071 Chest pain on breathing: Secondary | ICD-10-CM

## 2022-07-11 DIAGNOSIS — R079 Chest pain, unspecified: Secondary | ICD-10-CM | POA: Insufficient documentation

## 2022-07-11 DIAGNOSIS — R0782 Intercostal pain: Secondary | ICD-10-CM

## 2022-07-11 DIAGNOSIS — D869 Sarcoidosis, unspecified: Secondary | ICD-10-CM

## 2022-07-11 LAB — BASIC METABOLIC PANEL
BUN: 14 mg/dL (ref 6–23)
CO2: 30 mEq/L (ref 19–32)
Calcium: 9.8 mg/dL (ref 8.4–10.5)
Chloride: 107 mEq/L (ref 96–112)
Creatinine, Ser: 0.85 mg/dL (ref 0.40–1.20)
GFR: 76.64 mL/min (ref >60.00)
Glucose, Bld: 94 mg/dL (ref 70–99)
Potassium: 3.7 mEq/L (ref 3.5–5.1)
Sodium: 142 mEq/L (ref 135–145)

## 2022-07-11 LAB — CBC
HCT: 41.8 % (ref 36.0–46.0)
Hemoglobin: 13.2 g/dL (ref 12.0–15.0)
MCHC: 31.6 g/dL (ref 30.0–36.0)
MCV: 82.5 fl (ref 78.0–100.0)
Platelets: 181 10*3/uL (ref 150.0–400.0)
RBC: 5.06 Mil/uL (ref 3.87–5.11)
RDW: 15.9 % — ABNORMAL HIGH (ref 11.5–15.5)
WBC: 6.2 10*3/uL (ref 4.0–10.5)

## 2022-07-11 LAB — CK: Total CK: 130 U/L (ref 7–177)

## 2022-07-11 LAB — POCT I-STAT CREATININE: Creatinine, Ser: 0.8 mg/dL (ref 0.44–1.00)

## 2022-07-11 MED ORDER — SODIUM CHLORIDE (PF) 0.9 % IJ SOLN
INTRAMUSCULAR | Status: AC
  Filled 2022-07-11: qty 50

## 2022-07-11 MED ORDER — IOHEXOL 350 MG/ML SOLN
75.0000 mL | Freq: Once | INTRAVENOUS | Status: AC | PRN
  Administered 2022-07-11: 75 mL via INTRAVENOUS

## 2022-07-11 NOTE — Progress Notes (Signed)
Subjective:    Patient ID: Hannah Ellis, female    DOB: 1966-03-12, 56 y.o.   MRN: [id number]  HPI  ROV 06/12/22 --Ms. Sanville is 20 and has a history of sarcoidosis with both pulmonary and cutaneous manifestations.  She has associated mild obstructive lung disease, deals with chronic cough that is often impacted by her GERD.  Her most recent CT chest was January 2023, stable as above.  Currently managed on albuterol as needed.  She is on omeprazole 40 mg daily She has been experiencing dry cough for 3-4 days, waking her from sleep and present all through the days. She has used albuterol without any response. She is not having any GERD breakthrough. No real inciting event or URI sx.   ROV 07/11/2022 --56 year old woman with a history of sarcoidosis and associated mild obstructive lung disease.  She has had pulmonary and cutaneous manifestations of her sarcoid.  She has chronic cough, often exacerbated by GERD, also possibly allergic rhinitis.  She was treated for flaring cough with prednisone when I last saw her 1 month ago.  Subsequently received antibiotics 10/9 in case there was an infectious component here.  I also started her on loratadine in addition to her omeprazole. She returns today stating that she is having cough, slightly improved compared with last visit but still present.  She has left-sided rib/costal margin pain.  She began to have bilateral leg pain, upper leg anterior. No edema. No dyspnea. She is using albuterol for the cough, sometimes wakes her from sleep.    Review of Systems  As per HPI     Objective:   Physical Exam Vitals:   07/11/22 1342  BP: 138/76  Pulse: 83  Temp: 98.9 F (37.2 C)  TempSrc: Oral  SpO2: 97%  Weight: 199 lb (90.3 kg)  Height: 5\' 4"  (1.626 m)   Gen: Pleasant, well-nourished, in no distress,  normal affect, occasional cough  ENT: No lesions,  mouth clear,  oropharynx clear, no postnasal drip  Neck: No JVD, no stridor  Lungs: No use  of accessory muscles, no crackles, no wheezes.   Cardiovascular: RRR, heart sounds normal, no murmur or gallops, no peripheral edema  Musculoskeletal: No deformities, tender to palpation left costal margin extending down into the upper abdomen  Neuro: alert, non focal  Skin: no rash on the arms or face     Assessment & Plan:  Sarcoidosis ([place]) She is having myalgias and relative weakness in the proximal lower extremities bilaterally.  No overt weakness on exam today.  Question whether this could be related to sarcoidosis although also consider other rheumatological processes.  I will check an ACE level, also other autoimmune labs, CPK, LDH.  She is having upper abdomen and chest discomfort.  Tender to palpation so suspicion for pulmonary embolism is low.  I was going to check a CT scan of her chest to evaluate for stability of her sarcoidosis, so I will change this to CT-PA to look for acute or chronic thromboembolic disease as well.  We will try to get this done as soon as possible.  Keep your albuterol available to use if needed.  We will perform pulmonary function testing soon, talk about scheduling at her follow-up  Chronic cough Still present but stable.  Continue her PPI and loratadine.  Tessalon and Delsym as needed.  Chest pain Atypical chest pain, not really pleuritic in nature but certainly tender to palpation at the costal margin.  Question whether this is due to  recurrent coughing, and extension of her abdominal discomfort?  Need to rule out PE.  Will check a CT-PA.  Time spent 32 minutes  Baltazar Apo, MD, PhD 07/11/2022, 2:01 PM [place] [facility] [phone] or if no answer [phone]

## 2022-07-11 NOTE — Assessment & Plan Note (Signed)
She is having myalgias and relative weakness in the proximal lower extremities bilaterally.  No overt weakness on exam today.  Question whether this could be related to sarcoidosis although also consider other rheumatological processes.  I will check an ACE level, also other autoimmune labs, CPK, LDH.  She is having upper abdomen and chest discomfort.  Tender to palpation so suspicion for pulmonary embolism is low.  I was going to check a CT scan of her chest to evaluate for stability of her sarcoidosis, so I will change this to CT-PA to look for acute or chronic thromboembolic disease as well.  We will try to get this done as soon as possible.  Keep your albuterol available to use if needed.  We will perform pulmonary function testing soon, talk about scheduling at her follow-up

## 2022-07-11 NOTE — Patient Instructions (Addendum)
We will perform lab work today We will perform a CT scan of the chest now Keep your albuterol available to use 2 puffs if needed for shortness of breath, chest tightness, wheezing. Continue your omeprazole 40 mg once daily Continue loratadine 10 mg once daily Continue to use your Tessalon Perles or Delsym if you need it for cough suppression. Follow-up with APP in 1 week to review your lab work

## 2022-07-11 NOTE — Assessment & Plan Note (Signed)
Atypical chest pain, not really pleuritic in nature but certainly tender to palpation at the costal margin.  Question whether this is due to recurrent coughing, and extension of her abdominal discomfort?  Need to rule out PE.  Will check a CT-PA.

## 2022-07-11 NOTE — Assessment & Plan Note (Addendum)
Still present but stable.  Continue her PPI and loratadine.  Tessalon and Delsym as needed.

## 2022-07-12 LAB — SJOGRENS SYNDROME-B EXTRACTABLE NUCLEAR ANTIBODY: SSB (La) (ENA) Antibody, IgG: <1 AI

## 2022-07-12 LAB — SJOGRENS SYNDROME-A EXTRACTABLE NUCLEAR ANTIBODY: SSA (Ro) (ENA) Antibody, IgG: <1 AI

## 2022-07-17 ENCOUNTER — Ambulatory Visit: Payer: BC Managed Care – PPO | Admitting: Emergency Medicine

## 2022-07-17 LAB — ANA: Anti Nuclear Antibody (ANA): NEGATIVE

## 2022-07-17 LAB — ANGIOTENSIN CONVERTING ENZYME: Angiotensin-Converting Enzyme: 32 U/L (ref 9–67)

## 2022-07-17 LAB — RHEUMATOID FACTOR: Rheumatoid fact SerPl-aCnc: <14 IU/mL (ref <14)

## 2022-07-17 LAB — LACTATE DEHYDROGENASE: LDH: 162 U/L (ref 120–250)

## 2022-07-17 LAB — ANCA SCREEN W REFLEX TITER: ANCA SCREEN: NEGATIVE

## 2022-07-18 ENCOUNTER — Ambulatory Visit (INDEPENDENT_AMBULATORY_CARE_PROVIDER_SITE_OTHER): Payer: 59 | Admitting: Nurse Practitioner

## 2022-07-18 ENCOUNTER — Encounter: Payer: Self-pay | Admitting: Nurse Practitioner

## 2022-07-18 VITALS — BP 138/88 | HR 82 | Ht 64.0 in | Wt 198.2 lb

## 2022-07-18 DIAGNOSIS — M940 Chondrocostal junction syndrome [Tietze]: Secondary | ICD-10-CM | POA: Diagnosis not present

## 2022-07-18 DIAGNOSIS — J209 Acute bronchitis, unspecified: Secondary | ICD-10-CM

## 2022-07-18 DIAGNOSIS — D869 Sarcoidosis, unspecified: Secondary | ICD-10-CM

## 2022-07-18 MED ORDER — PREDNISONE 10 MG PO TABS: ORAL_TABLET | ORAL | 0 refills | Status: DC

## 2022-07-18 NOTE — Progress Notes (Signed)
@Patient  ID: Hannah Ellis, female    DOB: 1965/12/04, 56 y.o.   MRN: [id number]  Chief Complaint  Patient presents with   Follow-up    Pt f/u she reports breathing has been fine, she is still experiencing a cough but states "it's better than before".    Referring provider: Gregor Hams, FNP  HPI: 56 year old female, never smoker followed for sarcoidosis and chronic cough. She is a patient of Dr. Agustina Caroli and last seen in office on 07/11/2022. Past medical history significant for hx of TIA, HTN, GERD, anxiety.  TEST/EVENTS:  09/30/2017 PFT: FVC 93, FEV1 85, ratio 73, TLC 78, DLCOcor 75.  No BD 10/09/2021 CT chest w/o contrast: No LAD. There are mid and upper lung predominant pattern of irregular nodularity, consolidative opacity and groundglass.  There are additional small nodules in a perilymphatic distribution. Findings are stable and consistent with sarcoidosis 07/11/2022 CTA chest: No evidence of PE.  There are mildly increased bilateral upper lobe opacities, could be worsening sarcoid or superimposed infiltrate/pneumonia.  There is also extensive bronchial wall thickening, consistent with inflammation suggesting bronchitis.  07/18/2022: Today - follow up Patient presents today for follow-up.  She has been seen numerous times since 9/20 when she presented with acute cough.  She was treated at the time with prednisone taper.  She then saw a nurse practitioner on 10/5 with mild improvement but still persistent cough.  She was prescribed empiric doxycycline 7-day course.  She then developed myalgias/arthralgias, worse in the lower legs and was seen by Dr. Shearon Stalls on 10/9.  She was advised to complete her antibiotics as previously prescribed and supportive care for the pain.  She saw Dr. Lamonte Sakai 10 days later on 10/19 with reports of left-sided rib/costal margin pain.  She also was still having bilateral lower extremity leg pain.  Her cough was somewhat improved but still persistent.  Complete  work-up was ordered; lab results were unremarkable with normal ACE level and autoimmune panel.  Her CTA chest, completed 10/19, was negative for PE.  She for alveolar volume 75 have some increased opacities in the bilateral upper lobes and extensive bronchial wall thickening, consistent with inflammation/bronchitis. Today, she tells me that she is feeling somewhat better.  Feels like pain in her leg has resolved.  She still occasionally has some pain on her left side, which usually occurs with coughing or if she is sweeping at work.  She still has a persistent, congested cough.  Paroxysmal at times.  Worse at night.  No significant mucus production.  She denies any fevers, chills, hemoptysis, night sweats, skin lesions.  She is eating and drinking well.  Feels like her fatigue has also improved.  Breathing is overall at her baseline aside from with coughing spells.  No significant wheezing. She has been using her albuterol and hycodan at night. Takes benzonatate occasionally.  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   Depression    GERD (gastroesophageal reflux disease)    Hypertension    Pneumonia ~ 2017  Sarcoidosis, lung (HCC)    Stroke (HCC) 05/09/2015   "vs TIA; mild"    TIA (transient ischemic attack) 05/09/2015   "vs CVA; denies residual on 02/17/2017)    Tobacco History: Social History   Tobacco Use  Smoking Status Never  Smokeless Tobacco Never   Counseling given: Not  Answered   Outpatient Medications Prior to Visit  Medication Sig Dispense Refill   albuterol (VENTOLIN HFA) 108 (90 Base) MCG/ACT inhaler Inhale 1-2 puffs into the lungs every 6 (six) hours as needed for wheezing or shortness of breath. 1 each 0   amLODipine (NORVASC) 10 MG tablet Take 1 tablet (10 mg total) by mouth daily. 30 tablet 1   aspirin EC 325 MG EC tablet Take 1 tablet (325 mg total) by mouth daily. 30 tablet 0   atorvastatin (LIPITOR) 10 MG tablet TAKE 1 TABLET(10 MG) BY MOUTH DAILY     benzonatate (TESSALON) 200 MG capsule Take 1 capsule (200 mg total) by mouth 3 (three) times daily as needed for cough. 30 capsule 1   chlorzoxazone (PARAFON) 500 MG tablet chlorzoxazone 500 mg tablet  TAKE 1 TABLET BY MOUTH UP TO FOUR TIMES DAILY AS NEEDED FOR HEADACHE     ferrous sulfate 325 (65 FE) MG tablet Take 325 mg by mouth daily with breakfast.     gabapentin (NEURONTIN) 300 MG capsule gabapentin 300 mg capsule  TAKE 1 CAPSULE BY MOUTH EVERY DAY AT BEDTIME     hydrALAZINE (APRESOLINE) 25 MG tablet Take 1 tablet (25 mg total) by mouth 2 (two) times daily. (Patient taking differently: Take 50 mg by mouth 2 (two) times daily.) 60 tablet 1   hydrALAZINE (APRESOLINE) 50 MG tablet Take 50 mg by mouth 3 (three) times daily.     hydrochlorothiazide (HYDRODIURIL) 25 MG tablet Take 1 tablet (25 mg total) by mouth daily. 30 tablet 1   HYDROcodone bit-homatropine (HYCODAN) 5-1.5 MG/5ML syrup Take 5 mLs by mouth every 6 (six) hours as needed for cough. 240 mL 0   ibuprofen (ADVIL) 600 MG tablet Take 1 tablet (600 mg total) by mouth 3 (three) times daily. 30 tablet 0   loratadine (CLARITIN) 10 MG tablet Take 1 tablet (10 mg total) by mouth daily. 30 tablet 11   omeprazole (PRILOSEC) 40 MG capsule TAKE 1 CAPSULE(40 MG) BY MOUTH DAILY 30 capsule 5   tiZANidine (ZANAFLEX) 4 MG tablet Take 1 tablet (4 mg total) by mouth every 6 (six) hours as needed for muscle spasms. 30 tablet 0   topiramate (TOPAMAX) 25 MG  tablet TK 4 TS PO HS     topiramate (TOPAMAX) 25 MG tablet Take 3 tablets by mouth at bedtime.     triamcinolone cream (KENALOG) 0.1 % Apply 1 application topically daily. Dry skin  0   valsartan (DIOVAN) 160 MG tablet Take 1 tablet (160 mg total) by mouth daily. 30 tablet 1   No facility-administered medications prior to visit.     Review of Systems:   Constitutional: No weight loss or gain, night sweats, fevers, chills, + fatigue, lassitude (improving) HEENT: No headaches, difficulty swallowing, tooth/dental problems, or sore throat. No sneezing, itching, ear ache, nasal congestion, or post nasal drip CV:  No chest pain, orthopnea, PND, swelling in lower extremities, anasarca, dizziness, palpitations, syncope Resp: +shortness of breath with exertion; cough; chest wall pain. No excess mucus or change in color of mucus. No hemoptysis. No wheezing.  No chest wall deformity GI:  No heartburn, indigestion, abdominal pain, nausea, vomiting,  diarrhea, change in bowel habits, loss of appetite, bloody stools.  Skin: No rash, lesions, ulcerations MSK:  No joint pain or swelling.  No decreased range of motion.  No back pain. Neuro: No dizziness or lightheadedness.  Psych: No depression or anxiety. Mood stable.     Physical Exam:  BP 138/88   Pulse 82   Ht 5\' 4"  (1.626 m)   Wt 198 lb 3.2 oz (89.9 kg)   SpO2 100%   BMI 34.02 kg/m   GEN: Pleasant, interactive, well-appearing; in no acute distress HEENT:  Normocephalic and atraumatic. PERRLA. Sclera white. Nasal turbinates pink, moist and patent bilaterally. No rhinorrhea present. Oropharynx pink and moist, without exudate or edema. No lesions, ulcerations, or postnasal drip.  NECK:  Supple w/ fair ROM. No JVD present. Normal carotid impulses w/o bruits. Thyroid symmetrical with no goiter or nodules palpated. No lymphadenopathy.   CV: RRR, no m/r/g, no peripheral edema. Pulses intact, +2 bilaterally. No cyanosis, pallor or  clubbing. PULMONARY:  Unlabored, regular breathing. Minimal scattered rhonchi bilaterally A&P. Congested cough. No accessory muscle use.  GI: BS present and normoactive. Soft, non-tender to palpation. No organomegaly or masses detected. No CVA tenderness. MSK: No erythema, warmth or tenderness. Cap refil <2 sec all extrem. No deformities or joint swelling noted.  Neuro: A/Ox3. No focal deficits noted.   Skin: Warm, no lesions or rashe Psych: Normal affect and behavior. Judgement and thought content appropriate.     Lab Results:  CBC    Component Value Date/Time   WBC 6.2 07/11/2022 1440   RBC 5.06 07/11/2022 1440   HGB 13.2 07/11/2022 1440   HGB 13.1 10/29/2016 1124   HCT 41.8 07/11/2022 1440   HCT 41.7 10/29/2016 1124   PLT 181.0 07/11/2022 1440   PLT 226 10/29/2016 1124   MCV 82.5 07/11/2022 1440   MCV 80 10/29/2016 1124   MCH 26.9 08/17/2021 0947   MCHC 31.6 07/11/2022 1440   RDW 15.9 (H) 07/11/2022 1440   RDW 16.1 (H) 10/29/2016 1124   LYMPHSABS 1.4 08/17/2021 0947   MONOABS 0.4 08/17/2021 0947   EOSABS 0.1 08/17/2021 0947   BASOSABS 0.0 08/17/2021 0947    BMET    Component Value Date/Time   NA 142 07/11/2022 1440   NA 141 10/29/2016 1124   K 3.7 07/11/2022 1440   CL 107 07/11/2022 1440   CO2 30 07/11/2022 1440   GLUCOSE 94 07/11/2022 1440   BUN 14 07/11/2022 1440   BUN 10 10/29/2016 1124   CREATININE 0.80 07/11/2022 1557   CALCIUM 9.8 07/11/2022 1440   GFRNONAA 58 (L) 08/26/2020 1338   GFRAA >60 02/11/2018 1916    BNP No results found for: "BNP"   Imaging:  CT Angio Chest Pulmonary Embolism (PE) W or WO Contrast  Result Date: 07/11/2022 CLINICAL DATA:  Left rib pain.  History of sarcoidosis. EXAM: CT ANGIOGRAPHY CHEST WITH CONTRAST TECHNIQUE: Multidetector CT imaging of the chest was performed using the standard protocol during bolus administration of intravenous contrast. Multiplanar CT image reconstructions and MIPs were obtained to evaluate the  vascular anatomy. RADIATION DOSE REDUCTION: This exam was performed according to the departmental dose-optimization program which includes automated exposure control, adjustment of the mA and/or kV according to patient size and/or use of iterative reconstruction technique. CONTRAST:  69mL OMNIPAQUE IOHEXOL 350 MG/ML SOLN COMPARISON:  October 09, 2021. FINDINGS: Cardiovascular: Satisfactory opacification of the pulmonary arteries to the segmental level. No evidence of pulmonary embolism. Normal heart size. No pericardial effusion.  Mediastinum/Nodes: No enlarged mediastinal, hilar, or axillary lymph nodes. Thyroid gland, trachea, and esophagus demonstrate no significant findings. Lungs/Pleura: No pneumothorax or pleural effusion is noted. Mildly increased bilateral upper lobe opacities are noted which may represent worsening sarcoidosis or superimposed infiltrates or pneumonia. There is also noted extensive bronchial wall thickening in both lower lobes with surrounding inflammation suggesting bronchitis and surrounding inflammation. Upper Abdomen: No acute abnormality. Musculoskeletal: No chest wall abnormality. No acute or significant osseous findings. Review of the MIP images confirms the above findings. IMPRESSION: No definite evidence of pulmonary embolus. Mildly increased bilateral upper lobe opacities are noted which may represent worsening sarcoidosis or superimposed infiltrates or pneumonia. There is also noted extensive bronchial wall thickening in both lower lobes with surrounding inflammatory changes suggesting bronchitis. Electronically Signed   By: Lupita Raider M.D.   On: 07/11/2022 16:17         Latest Ref Rng & Units 09/30/2017   12:25 PM  PFT Results  FVC-Pre L 2.67   FVC-Predicted Pre % 93   FVC-Post L 2.66   FVC-Predicted Post % 92   Pre FEV1/FVC % % 74   Post FEV1/FCV % % 73   FEV1-Pre L 1.96   FEV1-Predicted Pre % 85   FEV1-Post L 1.95   DLCO uncorrected ml/min/mmHg 16.12   DLCO  [facility]% % 66   DLCO corrected ml/min/mmHg 18.21   DLCO COR %Predicted % 75   DLVA Predicted % 99   TLC L 3.96   TLC % Predicted % 78   RV % Predicted % 82     No results found for: "NITRICOXIDE"      Assessment & Plan:   Sarcoidosis (HCC) Unclear whether this is a sarcoid flare or bronchitis type illness related to possible recent viral infection? CT seems more consistent with superimpose infectious/inflammatory process with increased b/l upper lobe opacities and extensive bronchial wall thickening. Upper lobe opacities could be worsening sarcoid but her ACE level was normal. She's slowly improving. Still with persistent cough today; we will treat her with extended prednisone taper. Advised her to target cough control measures to limit further airway irritation. She has been covered from an antimicrobial standpoint with empiric doxy course. Plan to repeat imaging in 6 weeks to evaluate for progression/improvement.   Patient Instructions  Continue Albuterol inhaler 2 puffs every 6 hours as needed for shortness of breath or wheezing. Notify if symptoms persist despite rescue inhaler/neb use. Continue benzonatate 1 capsule Three times a day for cough Continue hycodan cough syrup 5 mL every 6 hours as needed for cough. May cause drowsiness. Do not drive after taking Continue loratadine 1 tab daily Continue omeprazole 1 capsule daily   Prednisone taper. 4 tabs for 5 days, then 3 tabs for 5 days, 2 tabs for 5 days, then 1 tab for 5 days, then stop. Take in AM with food  Mucinex [phone] mg Twice daily for chest congestion   Use sugar free hard candies or non-menthol cough drops during this time to soothe your throat.  Warm tea with honey and lemon.   Follow up in 2 weeks with Dr. Delton Coombes or Philis Nettle. If symptoms do not improve or worsen, please contact office for sooner follow up or seek emergency care.     Acute bronchitis See above.   Costochondritis Pain in left ribs consistent  with costochondritis; improving. Worse with certain movements. Continue tylenol OTC prn pain. We are also treating her with corticosteroids, which should help. See above  plan.   I spent 35 minutes of dedicated to the care of this patient on the date of this encounter to include pre-visit review of records, face-to-face time with the patient discussing conditions above, post visit ordering of testing, clinical documentation with the electronic health record, making appropriate referrals as documented, and communicating necessary findings to members of the patients care team.  Noemi ChapelKatherine V Tatyanna Cronk, NP 07/18/2022  Pt aware and understands NP's role.

## 2022-07-18 NOTE — Assessment & Plan Note (Signed)
Pain in left ribs consistent with costochondritis; improving. Worse with certain movements. Continue tylenol OTC prn pain. We are also treating her with corticosteroids, which should help. See above plan.

## 2022-07-18 NOTE — Assessment & Plan Note (Signed)
See above

## 2022-07-18 NOTE — Patient Instructions (Addendum)
Continue Albuterol inhaler 2 puffs every 6 hours as needed for shortness of breath or wheezing. Notify if symptoms persist despite rescue inhaler/neb use. Continue benzonatate 1 capsule Three times a day for cough Continue hycodan cough syrup 5 mL every 6 hours as needed for cough. May cause drowsiness. Do not drive after taking Continue loratadine 1 tab daily Continue omeprazole 1 capsule daily   Prednisone taper. 4 tabs for 5 days, then 3 tabs for 5 days, 2 tabs for 5 days, then 1 tab for 5 days, then stop. Take in AM with food  Mucinex [phone] mg Twice daily for chest congestion   Use sugar free hard candies or non-menthol cough drops during this time to soothe your throat.  Warm tea with honey and lemon.   Follow up in 2 weeks with Dr. Lamonte Sakai or Alanson Aly. If symptoms do not improve or worsen, please contact office for sooner follow up or seek emergency care.

## 2022-07-18 NOTE — Assessment & Plan Note (Addendum)
Unclear whether this is a sarcoid flare or bronchitis type illness related to possible recent viral infection? CT seems more consistent with superimpose infectious/inflammatory process with increased b/l upper lobe opacities and extensive bronchial wall thickening. Upper lobe opacities could be worsening sarcoid but her ACE level was normal. She's slowly improving. Still with persistent cough today; we will treat her with extended prednisone taper. Advised her to target cough control measures to limit further airway irritation. She has been covered from an antimicrobial standpoint with empiric doxy course. Plan to repeat imaging in 6 weeks to evaluate for progression/improvement.   Patient Instructions  Continue Albuterol inhaler 2 puffs every 6 hours as needed for shortness of breath or wheezing. Notify if symptoms persist despite rescue inhaler/neb use. Continue benzonatate 1 capsule Three times a day for cough Continue hycodan cough syrup 5 mL every 6 hours as needed for cough. May cause drowsiness. Do not drive after taking Continue loratadine 1 tab daily Continue omeprazole 1 capsule daily   Prednisone taper. 4 tabs for 5 days, then 3 tabs for 5 days, 2 tabs for 5 days, then 1 tab for 5 days, then stop. Take in AM with food  Mucinex [phone] mg Twice daily for chest congestion   Use sugar free hard candies or non-menthol cough drops during this time to soothe your throat.  Warm tea with honey and lemon.   Follow up in 2 weeks with Dr. Lamonte Sakai or Alanson Aly. If symptoms do not improve or worsen, please contact office for sooner follow up or seek emergency care.

## 2022-08-01 ENCOUNTER — Ambulatory Visit (INDEPENDENT_AMBULATORY_CARE_PROVIDER_SITE_OTHER): Payer: BC Managed Care – PPO

## 2022-08-01 ENCOUNTER — Encounter: Payer: Self-pay | Admitting: Nurse Practitioner

## 2022-08-01 ENCOUNTER — Ambulatory Visit (INDEPENDENT_AMBULATORY_CARE_PROVIDER_SITE_OTHER): Payer: BC Managed Care – PPO | Admitting: Nurse Practitioner

## 2022-08-01 VITALS — BP 136/84 | HR 71 | Ht 64.0 in | Wt 200.3 lb

## 2022-08-01 DIAGNOSIS — D869 Sarcoidosis, unspecified: Secondary | ICD-10-CM

## 2022-08-01 DIAGNOSIS — J069 Acute upper respiratory infection, unspecified: Secondary | ICD-10-CM

## 2022-08-01 DIAGNOSIS — J4541 Moderate persistent asthma with (acute) exacerbation: Secondary | ICD-10-CM | POA: Diagnosis not present

## 2022-08-01 DIAGNOSIS — J209 Acute bronchitis, unspecified: Secondary | ICD-10-CM | POA: Diagnosis not present

## 2022-08-01 DIAGNOSIS — J45901 Unspecified asthma with (acute) exacerbation: Secondary | ICD-10-CM | POA: Insufficient documentation

## 2022-08-01 LAB — POCT EXHALED NITRIC OXIDE: FeNO level (ppb): 30

## 2022-08-01 LAB — POC COVID19 BINAXNOW: SARS Coronavirus 2 Ag: NEGATIVE

## 2022-08-01 MED ORDER — PREDNISONE 10 MG PO TABS: 20.0000 mg | ORAL_TABLET | Freq: Every day | ORAL | 0 refills | Status: AC

## 2022-08-01 MED ORDER — BUDESONIDE-FORMOTEROL FUMARATE 160-4.5 MCG/ACT IN AERO: 2.0000 | INHALATION_SPRAY | Freq: Two times a day (BID) | RESPIRATORY_TRACT | 5 refills | Status: DC

## 2022-08-01 MED ORDER — IPRATROPIUM-ALBUTEROL 0.5-2.5 (3) MG/3ML IN SOLN
3.0000 mL | Freq: Four times a day (QID) | RESPIRATORY_TRACT | Status: DC | PRN
  Administered 2022-08-01: 3 mL via RESPIRATORY_TRACT

## 2022-08-01 MED ORDER — IPRATROPIUM-ALBUTEROL 0.5-2.5 (3) MG/3ML IN SOLN: 3.0000 mL | Freq: Four times a day (QID) | RESPIRATORY_TRACT | 2 refills | Status: DC | PRN

## 2022-08-01 MED ORDER — METHYLPREDNISOLONE ACETATE 80 MG/ML IJ SUSP
120.0000 mg | Freq: Once | INTRAMUSCULAR | Status: AC
  Administered 2022-08-01: 120 mg via INTRAMUSCULAR

## 2022-08-01 MED ORDER — FLUTICASONE PROPIONATE 50 MCG/ACT NA SUSP: 1.0000 | Freq: Every day | NASAL | 2 refills | Status: DC

## 2022-08-01 NOTE — Patient Instructions (Addendum)
Continue Albuterol inhaler 2 puffs or 3 mL neb every 6 hours as needed for shortness of breath or wheezing. Notify if symptoms persist despite rescue inhaler/neb use. Use neb twice daily until symptoms improve  Continue benzonatate 1 capsule Three times a day for cough Continue hycodan cough syrup 5 mL every 6 hours as needed for cough. May cause drowsiness. Do not drive after taking Continue loratadine 1 tab daily Continue omeprazole 1 capsule daily  Continue Mucinex [phone] mg Twice daily for chest congestion    Keep prednisone at 20 mg daily for the next two weeks until I see you back Symbicort 2 puffs Twice daily. Brush tongue and rinse mouth afterwards  Flonase nasal spray 2 sprays each nostril daily Saline nasal irrigation 1-2 times a day   Use sugar free hard candies or non-menthol cough drops during this time to soothe your throat.  Warm tea with honey and lemon.    Follow up in 2 weeks with Dr. Delton Coombes or Philis Nettle. If symptoms do not improve or worsen, please contact office for sooner follow up or seek emergency care.

## 2022-08-01 NOTE — Assessment & Plan Note (Signed)
Concerned that recent symptoms were related to possible sarcoid flare; although, CTA from 10/19 seemed more consistent with underlying infectious/inflammatory process.  ACE level was negative. She was treated with extended prednisone taper.  Was clinically improving and had good response to steroids.  Unfortunately, she seems to have contracted another viral infection. See above plan.

## 2022-08-01 NOTE — Assessment & Plan Note (Signed)
See above plan. 

## 2022-08-01 NOTE — Assessment & Plan Note (Addendum)
She seems to have developed a reactive airway disease in response to recurrent URIs.  COVID was negative today.  Likely other viral illness given her constellation of symptoms.  She did have significant bronchospasm on exam.  FeNO was elevated, despite prednisone use.  She was treated with Depo injection and DuoNeb.  Did feel like the nebulizer treatment helped her.  We are going to start her on ICS/LABA therapy for maintenance.  Extend out her prednisone and have her remain on 20 mg daily until she sees Dr. Delton Coombes in 2 weeks; she has been relatively steroid responsive.  If improved, she will be able to begin taper. Unclear how much her sarcoid is playing a role with her symptoms today.  Target cough control measures.  Advised on supportive care measures for URI symptoms.  CXR today without superimposed infection.  Did have bronchitic changes.  Recommended that we hold off on further antibiotic therapy as she has already been covered with empiric doxycycline course.  She will notify us if she develops any worsening symptoms or fevers.  Provided with strict ED/return precautions. Vital signs stable today on room air.  Patient Instructions  Continue Albuterol inhaler 2 puffs or 3 mL neb every 6 hours as needed for shortness of breath or wheezing. Notify if symptoms persist despite rescue inhaler/neb use. Use neb twice daily until symptoms improve  Continue benzonatate 1 capsule Three times a day for cough Continue hycodan cough syrup 5 mL every 6 hours as needed for cough. May cause drowsiness. Do not drive after taking Continue loratadine 1 tab daily Continue omeprazole 1 capsule daily  Continue Mucinex [phone] mg Twice daily for chest congestion    Keep prednisone at 20 mg daily for the next two weeks until I see you back Symbicort 2 puffs Twice daily. Brush tongue and rinse mouth afterwards  Flonase nasal spray 2 sprays each nostril daily Saline nasal irrigation 1-2 times a day   Use sugar free hard  candies or non-menthol cough drops during this time to soothe your throat.  Warm tea with honey and lemon.    Follow up in 2 weeks with Dr. Delton Coombes or Philis Nettle. If symptoms do not improve or worsen, please contact office for sooner follow up or seek emergency care.

## 2022-08-01 NOTE — Progress Notes (Signed)
  ID: Hannah Ellis, female    DOB: 1966/01/07, 56 y.o.   MRN: [id number]  Chief Complaint  Patient presents with   Follow-up    Pt f/u she is feeling some better she has 6 days left on taper, symptoms are still coughing/nasal congestion during the day. Denies fevers    Referring provider: Maudie Flakes, FNP  HPI: 56 year old female, never smoker followed for sarcoidosis and chronic cough. She is a patient of Dr. Kavin Leech and last seen in office on 07/18/2022 by [facility] NP. Past medical history significant for hx of TIA, HTN, GERD, anxiety.  TEST/EVENTS:  09/30/2017 PFT: FVC 93, FEV1 85, ratio 73, TLC 78, DLCOcor 75.  No BD 10/09/2021 CT chest w/o contrast: No LAD. There are mid and upper lung predominant pattern of irregular nodularity, consolidative opacity and groundglass.  There are additional small nodules in a perilymphatic distribution. Findings are stable and consistent with sarcoidosis 07/11/2022 CTA chest: No evidence of PE.  There are mildly increased bilateral upper lobe opacities, could be worsening sarcoid or superimposed infiltrate/pneumonia.  There is also extensive bronchial wall thickening, consistent with inflammation suggesting bronchitis.  07/18/2022: OV with Alverna Fawley NP for follow-up.  She has been seen numerous times since 9/20 when she presented with acute cough.  She was treated at the time with prednisone taper.  She then saw a nurse practitioner on 10/5 with mild improvement but still persistent cough.  She was prescribed empiric doxycycline 7-day course.  She then developed myalgias/arthralgias, worse in the lower legs and was seen by Dr. Celine Mans on 10/9.  She was advised to complete her antibiotics as previously prescribed and supportive care for the pain.  She saw Dr. Delton Coombes 10 days later on 10/19 with reports of left-sided rib/costal margin pain.  She also was still having bilateral lower extremity leg pain.  Her cough was somewhat improved but still persistent.   Complete work-up was ordered; lab results were unremarkable with normal ACE level and autoimmune panel.  Her CTA chest, completed 10/19, was negative for PE.  She for alveolar volume 75 have some increased opacities in the bilateral upper lobes and extensive bronchial wall thickening, consistent with inflammation/bronchitis. Somewhat better.  Feels like pain in her leg has resolved.  She still occasionally has some pain on her left side, which usually occurs with coughing or if she is sweeping at work.  She still has a persistent, congested cough.  Paroxysmal at times.  Worse at night.  Unclear whether this is a sarcoid flare or bronchitis type illness related to possible recent viral infection? CT seems more consistent with superimpose infectious/inflammatory process with increased b/l upper lobe opacities and extensive bronchial wall thickening. Upper lobe opacities could be worsening sarcoid but her ACE level was normal. She's slowly improving. Still with persistent cough today; we will treat her with extended prednisone taper. Advised her to target cough control measures to limit further airway irritation. She has been covered from an antimicrobial standpoint with empiric doxy course. Plan to repeat imaging in 6 weeks to evaluate for progression/improvement.    08/01/2022: Today - follow up Patient presents today for intended follow-up; unfortunately, she is having acute symptoms as well.  She tells me that she was feeling better after her last visit.  Feel like the extended prednisone had helped her a lot.  She was also working on cough control measures.  Cough had not entirely resolved but was much more tolerable and was not keeping her up  at night.  She stopped having such severe paroxysmal's.  Then around 2-3 nights ago, she started having cold-like symptoms with increased nasal congestion/drainage.  Started having some headaches and cough picked back up again.  Still dry.  She is also having some chest  tightness and wheezing now.  Does feel little more short winded.  She denies any fevers, chills, hemoptysis, lower extremity swelling.  Chest wall pain and myalgias have resolved. She did not do any viral testing at home.  Unsure if she has been around anybody who has been sick.  She is currently on 20 mg of prednisone, plans to drop to 10 mg tomorrow.  Using albuterol, which does help some.  She still is using the benzonatate and Hycodan cough syrup.  She takes loratadine for allergies daily.  No current nasal sprays.  On omeprazole for reflux with good control.  Still using Mucinex twice a day as well.  Not taking any over-the-counter sinus or cold remedies.  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   Depression    GERD (gastroesophageal reflux disease)    Hypertension    Pneumonia ~ 2017   Sarcoidosis, lung (HCC)    Stroke (HCC) 05/09/2015   "vs TIA; mild"    TIA (transient ischemic attack) 05/09/2015   "vs CVA; denies residual on 02/17/2017)    Tobacco History: Social History   Tobacco Use  Smoking Status Never  Smokeless Tobacco Never   Counseling given: Not Answered   Outpatient Medications Prior to Visit  Medication Sig Dispense Refill   albuterol (VENTOLIN HFA) 108 (90 Base) MCG/ACT inhaler Inhale 1-2 puffs into the lungs every 6 (six) hours as needed for wheezing or shortness of breath. 1 each 0   amLODipine (NORVASC) 10 MG tablet Take 1 tablet (10 mg total) by mouth daily. 30 tablet  1   aspirin EC 325 MG EC tablet Take 1 tablet (325 mg total) by mouth daily. 30 tablet 0   atorvastatin (LIPITOR) 10 MG tablet TAKE 1 TABLET(10 MG) BY MOUTH DAILY     benzonatate (TESSALON) 200 MG capsule Take 1 capsule (200 mg total) by mouth 3 (three) times daily as needed for cough. 30 capsule 1   chlorzoxazone (PARAFON) 500 MG tablet chlorzoxazone 500 mg tablet  TAKE 1 TABLET BY MOUTH UP TO FOUR TIMES DAILY AS NEEDED FOR HEADACHE     ferrous sulfate 325 (65 FE) MG tablet Take 325 mg by mouth daily with breakfast.     gabapentin (NEURONTIN) 300 MG capsule gabapentin 300 mg capsule  TAKE 1 CAPSULE BY MOUTH EVERY DAY AT BEDTIME     hydrALAZINE (APRESOLINE) 25 MG tablet Take 1 tablet (25 mg total) by mouth 2 (two) times daily. (Patient taking differently: Take 50 mg by mouth 2 (two) times daily.) 60 tablet 1   hydrALAZINE (APRESOLINE) 50 MG tablet Take 50 mg by mouth 3 (three) times daily.     hydrochlorothiazide (HYDRODIURIL) 25 MG tablet Take 1 tablet (25 mg total) by mouth daily. 30 tablet 1  HYDROcodone bit-homatropine (HYCODAN) 5-1.5 MG/5ML syrup Take 5 mLs by mouth every 6 (six) hours as needed for cough. 240 mL 0   ibuprofen (ADVIL) 600 MG tablet Take 1 tablet (600 mg total) by mouth 3 (three) times daily. 30 tablet 0   loratadine (CLARITIN) 10 MG tablet Take 1 tablet (10 mg total) by mouth daily. 30 tablet 11   omeprazole (PRILOSEC) 40 MG capsule TAKE 1 CAPSULE(40 MG) BY MOUTH DAILY 30 capsule 5   predniSONE (DELTASONE) 10 MG tablet 4 tabs for 5 days, 3 tabs for 5 days, 2 tabs for 5 days, 1 tab for 5 days then stop 50 tablet 0   tiZANidine (ZANAFLEX) 4 MG tablet Take 1 tablet (4 mg total) by mouth every 6 (six) hours as needed for muscle spasms. 30 tablet 0   topiramate (TOPAMAX) 25 MG tablet TK 4 TS PO HS     topiramate (TOPAMAX) 25 MG tablet Take 3 tablets by mouth at bedtime.     triamcinolone cream (KENALOG) 0.1 % Apply 1 application topically daily. Dry skin  0   valsartan (DIOVAN)  160 MG tablet Take 1 tablet (160 mg total) by mouth daily. 30 tablet 1   No facility-administered medications prior to visit.     Review of Systems:   Constitutional: No weight loss or gain, night sweats, fevers, chills, + fatigue, lassitude HEENT: No difficulty swallowing, tooth/dental problems, or sore throat. No sneezing, itching, ear ache. +headaches, nasal congestion/drainage CV:  No chest pain, orthopnea, PND, swelling in lower extremities, anasarca, dizziness, palpitations, syncope Resp: +shortness of breath with exertion; cough; wheezing. No excess mucus or change in color of mucus. No hemoptysis. No chest wall deformity GI:  No heartburn, indigestion, abdominal pain, nausea, vomiting, diarrhea, change in bowel habits, loss of appetite, bloody stools.  Skin: No rash, lesions, ulcerations MSK:  No joint pain or swelling.  No decreased range of motion.  No back pain. Neuro: No dizziness or lightheadedness.  Psych: No depression or anxiety. Mood stable.     Physical Exam:  BP 136/84   Pulse 71   Ht 5\' 4"  (1.626 m)   Wt 200 lb 4.8 oz (90.9 kg)   SpO2 99%   BMI 34.38 kg/m   GEN: Pleasant, interactive, ill appearing; in no acute distress HEENT:  Normocephalic and atraumatic. PERRLA. Sclera white. Nasal turbinates erythematous, moist and patent bilaterally. Clear rhinorrhea present. Oropharynx erythematous and moist, without exudate or edema. No lesions, ulcerations NECK:  Supple w/ fair ROM. No JVD present. Normal carotid impulses w/o bruits. Thyroid symmetrical with no goiter or nodules palpated. No lymphadenopathy.   CV: RRR, no m/r/g, no peripheral edema. Pulses intact, +2 bilaterally. No cyanosis, pallor or clubbing. PULMONARY:  Unlabored, regular breathing. Scattered wheezes throughout A&P. Bronchitic cough. No accessory muscle use.  GI: BS present and normoactive. Soft, non-tender to palpation. No organomegaly or masses detected.  MSK: No erythema, warmth or tenderness.  Cap refil <2 sec all extrem. No deformities or joint swelling noted.  Neuro: A/Ox3. No focal deficits noted.   Skin: Warm, no lesions or rashe Psych: Normal affect and behavior. Judgement and thought content appropriate.     Lab Results:  CBC    Component Value Date/Time   WBC 6.2 07/11/2022 1440   RBC 5.06 07/11/2022 1440   HGB 13.2 07/11/2022 1440   HGB 13.1 10/29/2016 1124   HCT 41.8 07/11/2022 1440   HCT 41.7 10/29/2016 1124   PLT 181.0 07/11/2022 1440   PLT 226  10/29/2016 1124   MCV 82.5 07/11/2022 1440   MCV 80 10/29/2016 1124   MCH 26.9 08/17/2021 0947   MCHC 31.6 07/11/2022 1440   RDW 15.9 (H) 07/11/2022 1440   RDW 16.1 (H) 10/29/2016 1124   LYMPHSABS 1.4 08/17/2021 0947   MONOABS 0.4 08/17/2021 0947   EOSABS 0.1 08/17/2021 0947   BASOSABS 0.0 08/17/2021 0947    BMET    Component Value Date/Time   NA 142 07/11/2022 1440   NA 141 10/29/2016 1124   K 3.7 07/11/2022 1440   CL 107 07/11/2022 1440   CO2 30 07/11/2022 1440   GLUCOSE 94 07/11/2022 1440   BUN 14 07/11/2022 1440   BUN 10 10/29/2016 1124   CREATININE 0.80 07/11/2022 1557   CALCIUM 9.8 07/11/2022 1440   GFRNONAA 58 (L) 08/26/2020 1338   GFRAA >60 02/11/2018 1916    BNP No results found for: "BNP"   Imaging:  DG Chest 2 View  Result Date: 08/01/2022 CLINICAL DATA:  Acute bronchitis.  Increasing cough and wheezing. EXAM: CHEST - 2 VIEW COMPARISON:  Radiograph 06/12/2022. Chest CT 07/11/2022 FINDINGS: The heart is normal in size. Stable mediastinal contours. There is bilateral perihilar scarring. Central bronchial thickening. No evidence of acute airspace disease. No pulmonary edema, pleural effusion, or pneumothorax. No acute osseous findings. IMPRESSION: Central bronchial thickening. Bilateral perihilar scarring. Electronically Signed   By: Narda Rutherford M.D.   On: 08/01/2022 16:25   CT Angio Chest Pulmonary Embolism (PE) W or WO Contrast  Result Date: 07/11/2022 CLINICAL DATA:  Left rib  pain.  History of sarcoidosis. EXAM: CT ANGIOGRAPHY CHEST WITH CONTRAST TECHNIQUE: Multidetector CT imaging of the chest was performed using the standard protocol during bolus administration of intravenous contrast. Multiplanar CT image reconstructions and MIPs were obtained to evaluate the vascular anatomy. RADIATION DOSE REDUCTION: This exam was performed according to the departmental dose-optimization program which includes automated exposure control, adjustment of the mA and/or kV according to patient size and/or use of iterative reconstruction technique. CONTRAST:  30mL OMNIPAQUE IOHEXOL 350 MG/ML SOLN COMPARISON:  October 09, 2021. FINDINGS: Cardiovascular: Satisfactory opacification of the pulmonary arteries to the segmental level. No evidence of pulmonary embolism. Normal heart size. No pericardial effusion. Mediastinum/Nodes: No enlarged mediastinal, hilar, or axillary lymph nodes. Thyroid gland, trachea, and esophagus demonstrate no significant findings. Lungs/Pleura: No pneumothorax or pleural effusion is noted. Mildly increased bilateral upper lobe opacities are noted which may represent worsening sarcoidosis or superimposed infiltrates or pneumonia. There is also noted extensive bronchial wall thickening in both lower lobes with surrounding inflammation suggesting bronchitis and surrounding inflammation. Upper Abdomen: No acute abnormality. Musculoskeletal: No chest wall abnormality. No acute or significant osseous findings. Review of the MIP images confirms the above findings. IMPRESSION: No definite evidence of pulmonary embolus. Mildly increased bilateral upper lobe opacities are noted which may represent worsening sarcoidosis or superimposed infiltrates or pneumonia. There is also noted extensive bronchial wall thickening in both lower lobes with surrounding inflammatory changes suggesting bronchitis. Electronically Signed   By: Lupita Raider M.D.   On: 07/11/2022 16:17    ipratropium-albuterol  (DUONEB) 0.5-2.5 (3) MG/3ML nebulizer solution 3 mL     Date Action Dose Route User   08/01/2022 1635 Given 3 mL Nebulization Isley, Holly E, CMA      methylPREDNISolone acetate (DEPO-MEDROL) injection 120 mg     Date Action Dose Route User   08/01/2022 1633 Given 120 mg Intramuscular (Left Ventrogluteal) Jaynee Eagles, CMA  Latest Ref Rng & Units 09/30/2017   12:25 PM  PFT Results  FVC-Pre L 2.67   FVC-Predicted Pre % 93   FVC-Post L 2.66   FVC-Predicted Post % 92   Pre FEV1/FVC % % 74   Post FEV1/FCV % % 73   FEV1-Pre L 1.96   FEV1-Predicted Pre % 85   FEV1-Post L 1.95   DLCO uncorrected ml/min/mmHg 16.12   DLCO UNC% % 66   DLCO corrected ml/min/mmHg 18.21   DLCO COR %Predicted % 75   DLVA Predicted % 99   TLC L 3.96   TLC % Predicted % 78   RV % Predicted % 82     No results found for: "NITRICOXIDE"      Assessment & Plan:   Reactive airway disease with acute exacerbation She seems to have developed a reactive airway disease in response to recurrent URIs.  COVID was negative today.  Likely other viral illness given her constellation of symptoms.  She did have significant bronchospasm on exam.  FeNO was elevated, despite prednisone use.  She was treated with Depo injection and DuoNeb.  Did feel like the nebulizer treatment helped her.  We are going to start her on ICS/LABA therapy for maintenance.  Extend out her prednisone and have her remain on 20 mg daily until she sees Dr. Delton Coombes in 2 weeks; she has been relatively steroid responsive.  If improved, she will be able to begin taper. Unclear how much her sarcoid is playing a role with her symptoms today.  Target cough control measures.  Advised on supportive care measures for URI symptoms.  CXR today without superimposed infection.  Did have bronchitic changes.  Recommended that we hold off on further antibiotic therapy as she has already been covered with empiric doxycycline course.  She will notify us if she  develops any worsening symptoms or fevers.  Provided with strict ED/return precautions. Vital signs stable today on room air.  Patient Instructions  Continue Albuterol inhaler 2 puffs or 3 mL neb every 6 hours as needed for shortness of breath or wheezing. Notify if symptoms persist despite rescue inhaler/neb use. Use neb twice daily until symptoms improve  Continue benzonatate 1 capsule Three times a day for cough Continue hycodan cough syrup 5 mL every 6 hours as needed for cough. May cause drowsiness. Do not drive after taking Continue loratadine 1 tab daily Continue omeprazole 1 capsule daily  Continue Mucinex [phone] mg Twice daily for chest congestion    Keep prednisone at 20 mg daily for the next two weeks until I see you back Symbicort 2 puffs Twice daily. Brush tongue and rinse mouth afterwards  Flonase nasal spray 2 sprays each nostril daily Saline nasal irrigation 1-2 times a day   Use sugar free hard candies or non-menthol cough drops during this time to soothe your throat.  Warm tea with honey and lemon.    Follow up in 2 weeks with Dr. Delton Coombes or Philis Nettle. If symptoms do not improve or worsen, please contact office for sooner follow up or seek emergency care.    Acute bronchitis See above plan  Sarcoidosis [facility]) Concerned that recent symptoms were related to possible sarcoid flare; although, CTA from 10/19 seemed more consistent with underlying infectious/inflammatory process.  ACE level was negative. She was treated with extended prednisone taper.  Was clinically improving and had good response to steroids.  Unfortunately, she seems to have contracted another viral infection. See above plan.   URI (upper  respiratory infection) See above plan.    I spent 42 minutes of dedicated to the care of this patient on the date of this encounter to include pre-visit review of records, face-to-face time with the patient discussing conditions above, post visit ordering of testing,  clinical documentation with the electronic health record, making appropriate referrals as documented, and communicating necessary findings to members of the patients care team.  Noemi Chapel, NP 08/01/2022  Pt aware and understands NP's role.

## 2022-08-05 ENCOUNTER — Telehealth: Payer: Self-pay | Admitting: Nurse Practitioner

## 2022-08-05 MED ORDER — AZITHROMYCIN 250 MG PO TABS: ORAL_TABLET | ORAL | 0 refills | Status: DC

## 2022-08-05 NOTE — Telephone Encounter (Signed)
Spoke with pt and reviewed Katie's recommendations along with medication order and instructions. Pt stated understanding and no concerns or questions. Orders placed   [facility] could you please update on nebulizer machine order, it was placed on 08/01/22

## 2022-08-05 NOTE — Telephone Encounter (Signed)
Order was placed on 11/9 and sent to [employer] on 11/10 and confirmation was received from [place] on 11/10.  Spoke to [facility] today to check status.  She states she tried to call pt to confirm no insurance or to see if she has a new insurance & wasn't able to get up with her.  She is going to go ahead and send to intake team but they need to be able to speak to her to set up delivery time and speak to her if she is going to be private pay.  I verified with Jasmine the phone # they tried to call & it is the # we have on file.  I called & spoke to pt & made her aware [employer] will be calling her.  I called Jasmine back & made her aware I spoke to pt.  She states order has been sent and is in "good to go" status & someone will be calling her.  Nothing further needed.

## 2022-08-05 NOTE — Telephone Encounter (Signed)
Please send azithromycin (250 mg tab) 2 tabs on day one followed by 1 tab daily for four additional days for bronchitis. Continue prednisone and Symbicort inhaler as previously prescribed. Please f/u on nebulizer order - if not previously placed, please order today. She should use the nebulizer treatment first followed by her Symbicort. Continue cough control measures as prescribed. Keep appt with Dr. Delton Coombes. ED if symptoms worsen.

## 2022-08-05 NOTE — Telephone Encounter (Signed)
Spoke with pt who states cough has not improved even when taking prednisone, using Symbicort and albuterol as well as taking Tessalon and Hycodan. Pt states cough is same as when she was seen in office on last Thursday. Pt stated the nebulizer did provide short term slight relief when given to her in office. Pt has not received home nebulizer as of yet thou. Pt says when she is cough from time to time she losses slight control of bowel and pt is wondering if that could be r/t her pulmonary sarcoidosis. Katie please advise. Pt is scheduled for OV with Dr. Delton Coombes on 08/14/22.

## 2022-08-06 ENCOUNTER — Ambulatory Visit (HOSPITAL_COMMUNITY)
Admission: EM | Admit: 2022-08-06 | Discharge: 2022-08-06 | Disposition: A | Discharge status: Home / Self Care | Payer: BC Managed Care – PPO | Attending: Internal Medicine | Admitting: Internal Medicine

## 2022-08-06 ENCOUNTER — Telehealth: Payer: Self-pay | Admitting: Emergency Medicine

## 2022-08-06 ENCOUNTER — Encounter (HOSPITAL_COMMUNITY): Payer: Self-pay

## 2022-08-06 DIAGNOSIS — K2900 Acute gastritis without bleeding: Secondary | ICD-10-CM | POA: Diagnosis not present

## 2022-08-06 MED ORDER — LIDOCAINE VISCOUS HCL 2 % MT SOLN
OROMUCOSAL | Status: AC
  Filled 2022-08-06: qty 15

## 2022-08-06 MED ORDER — LIDOCAINE VISCOUS HCL 2 % MT SOLN
15.0000 mL | Freq: Once | OROMUCOSAL | Status: AC
  Administered 2022-08-06: 15 mL via OROMUCOSAL

## 2022-08-06 MED ORDER — ALUM & MAG HYDROXIDE-SIMETH 200-200-20 MG/5ML PO SUSP
ORAL | Status: AC
  Filled 2022-08-06: qty 30

## 2022-08-06 MED ORDER — ALUM & MAG HYDROXIDE-SIMETH 200-200-20 MG/5ML PO SUSP
30.0000 mL | Freq: Once | ORAL | Status: AC
  Administered 2022-08-06: 30 mL via ORAL

## 2022-08-06 MED ORDER — PANTOPRAZOLE SODIUM 20 MG PO TBEC: 20.0000 mg | DELAYED_RELEASE_TABLET | Freq: Every day | ORAL | 0 refills | Status: DC

## 2022-08-06 MED ORDER — ONDANSETRON 4 MG PO TBDP: 4.0000 mg | ORAL_TABLET | Freq: Three times a day (TID) | ORAL | 0 refills | Status: DC | PRN

## 2022-08-06 MED ORDER — SUCRALFATE 1 GM/10ML PO SUSP: 1.0000 g | Freq: Three times a day (TID) | ORAL | 0 refills | Status: AC

## 2022-08-06 MED ORDER — ONDANSETRON 4 MG PO TBDP
4.0000 mg | ORAL_TABLET | Freq: Once | ORAL | Status: AC
  Administered 2022-08-06: 4 mg via ORAL

## 2022-08-06 MED ORDER — ONDANSETRON 4 MG PO TBDP
ORAL_TABLET | ORAL | Status: AC
  Filled 2022-08-06: qty 1

## 2022-08-06 NOTE — Discharge Instructions (Addendum)
Maintain adequate hydration Please take Carafate before you eat, for the other prescribed medications please eat before you take them Avoid taking ibuprofen or other nonsteroidal anti-inflammatory agents If you have worsening abdominal pain please go to the emergency room to be evaluated further.

## 2022-08-06 NOTE — ED Triage Notes (Signed)
Pt is here for abdominal pain causing diarrhea and vomiting since today

## 2022-08-06 NOTE — Telephone Encounter (Signed)
Stop medication. Is she still having severe abdominal pain? She should be evaluated at the ED if so. She has been coughing frequently so unclear if it is the abx or if she's developed a hernia from the coughing.

## 2022-08-06 NOTE — Telephone Encounter (Signed)
Called and spoke with patient. She verbalized understanding.   Nothing further needed at time of call.  

## 2022-08-06 NOTE — Telephone Encounter (Signed)
Called and spoke with patient. She stated that she started the zpak yesterday and she has been having extreme stomach pain ever since. She confirmed that she took yesterdays dose and this morning dose with food. She denied any diarrhea but was nauseous yesterday. Denied any fever. She was very tearful on the phone due to the pain.   She wanted to know if Florentina Addison could send something else in for her.   Pharmacy is [facility] on [address].   Florentina Addison, can you please advise? Thanks!

## 2022-08-06 NOTE — Telephone Encounter (Signed)
Patient called to inform the nurse that the antibiotics that was sent in for her to take is giving her severe stomach pain.  She stated the medication yesterday.  Please call patient to discuss further asap.  CB# [phone]

## 2022-08-06 NOTE — ED Provider Notes (Signed)
[facility]    CSN: [id number] Arrival date & time: 08/06/22  1459      History   Chief Complaint Chief Complaint  Patient presents with   Abdominal Pain   Diarrhea   Emesis    HPI Hannah Ellis is a 56 y.o. female comes to the urgent care with 1 day history of epigastric pain of fairly sudden onset this morning.  Patient was recently started on azithromycin and prednisone for bronchitis in the setting of sarcoidosis.  Pain is currently 8 out of 10, nonradiating with no known relieving factors.  Patient ate this morning and threw up soon after eating.  She has had a few loose bowel movements today.  Stools are not melanotic.  Patient denies any recent travel or change in dietary habits.  No fever or chills.  Patient continues to have a cough with no shortness of breath.  She denies any alcohol intake or any over-the-counter NSAID use.  No abdominal distention or bloating.  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   Depression    GERD (gastroesophageal reflux disease)    Hypertension    Pneumonia ~ 2017   Sarcoidosis, lung (HCC)    Stroke (HCC) 05/09/2015   "vs TIA; mild"    TIA (transient ischemic attack) 05/09/2015   "vs CVA; denies residual on 02/17/2017)    Patient Active Problem List   Diagnosis Date Noted   Reactive airway disease with acute exacerbation 08/01/2022   Costochondritis 07/18/2022   Chest pain 07/11/2022   Acute bronchitis 06/27/2022   URI (upper respiratory infection) 08/30/2021   Neck swelling 07/27/2021   Posterior right knee pain 07/27/2021   GERD (gastroesophageal reflux disease) 04/05/2021   Chronic cough 08/26/2017   Hypokalemia    Abdominal pain 02/17/2017   Pyelonephritis    Hemifacial spasm 10/29/2016   Sarcoidosis 10/15/2016   Encephalopathy, hypertensive 07/11/2015   Anxiety state 07/11/2015   Essential hypertension 07/11/2015   TIA (transient  ischemic attack) 05/09/2015   Uncontrolled hypertension 05/09/2015   Patient nonadherence 05/09/2015   Microcytic anemia 05/09/2015    Past Surgical History:  Procedure Laterality Date   APPENDECTOMY     CESAREAN SECTION  1998   EYE SURGERY Left ~ 1972   FOOT SURGERY Right    "took some bone out of pinky and one next to it"   TUBAL LIGATION  1998    OB History   No obstetric history on file.      Home Medications    Prior to Admission medications   Medication Sig Start Date End Date Taking? Authorizing Provider  ondansetron (ZOFRAN-ODT) 4 MG disintegrating tablet Take 1 tablet (4 mg total) by mouth every 8 (eight) hours as needed for nausea or vomiting. 08/06/22  Yes Leighanne Adolph, Britta Mccreedy, MD  pantoprazole (PROTONIX) 20 MG tablet Take 1 tablet (20 mg total) by mouth daily. 08/06/22  Yes Demecia Northway, Britta Mccreedy, MD  sucralfate (CARAFATE) 1 GM/10ML suspension Take 10 mLs (1 g total) by mouth 4 (four) times daily -  with meals and at bedtime for 3 days. 08/06/22 08/09/22 Yes Macsen Nuttall, Britta Mccreedy, MD  albuterol (VENTOLIN HFA) 108 (90 Base) MCG/ACT inhaler Inhale 1-2 puffs into the lungs every 6 (six) hours as needed for wheezing or shortness of breath. 04/05/21   Leslye Peer, MD  amLODipine (NORVASC) 10 MG tablet Take 1 tablet (10  mg total) by mouth daily. 08/27/20   Mesner, Corene Cornea, MD  aspirin EC 325 MG EC tablet Take 1 tablet (325 mg total) by mouth daily. 05/11/15   Geradine Girt, DO  atorvastatin (LIPITOR) 10 MG tablet TAKE 1 TABLET(10 MG) BY MOUTH DAILY 04/02/21   [provider]  azithromycin (ZITHROMAX Z-PAK) 250 MG tablet Take 2 tabs today, then 1 tab until gone 08/05/22   Cobb, Karie Schwalbe, NP  benzonatate (TESSALON) 200 MG capsule Take 1 capsule (200 mg total) by mouth 3 (three) times daily as needed for cough. 06/12/22   Collene Gobble, MD  budesonide-formoterol (SYMBICORT) 160-4.5 MCG/ACT inhaler Inhale 2 puffs into the lungs in the morning and at bedtime. 08/01/22   Cobb,  Karie Schwalbe, NP  chlorzoxazone (PARAFON) 500 MG tablet chlorzoxazone 500 mg tablet  TAKE 1 TABLET BY MOUTH UP TO FOUR TIMES DAILY AS NEEDED FOR HEADACHE 01/01/21   [provider]  ferrous sulfate 325 (65 FE) MG tablet Take 325 mg by mouth daily with breakfast.    [provider]  fluticasone (FLONASE) 50 MCG/ACT nasal spray Place 1 spray into both nostrils daily. 08/01/22   Cobb, Karie Schwalbe, NP  gabapentin (NEURONTIN) 300 MG capsule gabapentin 300 mg capsule  TAKE 1 CAPSULE BY MOUTH EVERY DAY AT BEDTIME    [provider]  hydrALAZINE (APRESOLINE) 25 MG tablet Take 1 tablet (25 mg total) by mouth 2 (two) times daily. Patient taking differently: Take 50 mg by mouth 2 (two) times daily. 08/27/20   Mesner, Corene Cornea, MD  hydrALAZINE (APRESOLINE) 50 MG tablet Take 50 mg by mouth 3 (three) times daily. 09/14/21   [provider]  hydrochlorothiazide (HYDRODIURIL) 25 MG tablet Take 1 tablet (25 mg total) by mouth daily. 08/27/20   Mesner, Corene Cornea, MD  HYDROcodone bit-homatropine (HYCODAN) 5-1.5 MG/5ML syrup Take 5 mLs by mouth every 6 (six) hours as needed for cough. 06/17/22   Collene Gobble, MD  ibuprofen (ADVIL) 600 MG tablet Take 1 tablet (600 mg total) by mouth 3 (three) times daily. 04/04/22   Nyoka Lint, PA-C  ipratropium-albuterol (DUONEB) 0.5-2.5 (3) MG/3ML SOLN Take 3 mLs by nebulization every 6 (six) hours as needed (shortness of breath or wheezing). 08/01/22   Cobb, Karie Schwalbe, NP  loratadine (CLARITIN) 10 MG tablet Take 1 tablet (10 mg total) by mouth daily. 06/12/22   Collene Gobble, MD  predniSONE (DELTASONE) 10 MG tablet 4 tabs for 5 days, 3 tabs for 5 days, 2 tabs for 5 days, 1 tab for 5 days then stop 07/18/22   Cobb, Karie Schwalbe, NP  predniSONE (DELTASONE) 10 MG tablet Take 2 tablets (20 mg total) by mouth daily with breakfast for 14 days. 08/01/22 08/15/22  Cobb, Karie Schwalbe, NP  tiZANidine (ZANAFLEX) 4 MG tablet Take 1 tablet (4 mg total) by mouth every 6  (six) hours as needed for muscle spasms. 04/04/22   Nyoka Lint, PA-C  topiramate (TOPAMAX) 25 MG tablet TK 4 TS PO HS 01/05/19   [provider]  topiramate (TOPAMAX) 25 MG tablet Take 3 tablets by mouth at bedtime. 01/01/21   [provider]  triamcinolone cream (KENALOG) 0.1 % Apply 1 application topically daily. Dry skin 05/12/17   [provider]  valsartan (DIOVAN) 160 MG tablet Take 1 tablet (160 mg total) by mouth daily. 08/27/20   Mesner, Corene Cornea, MD    Family History Family History  Problem Relation Age of Onset   Hypertension Mother  Colon cancer Mother    Hypertension Father    Hypertension Brother     Social History Social History   Tobacco Use   Smoking status: Never   Smokeless tobacco: Never  Vaping Use   Vaping Use: Never used  Substance Use Topics   Alcohol use: No   Drug use: No     Allergies   Patient has no known allergies.   Review of Systems Review of Systems  Constitutional: Negative.   HENT: Negative.    Respiratory:  Positive for cough. Negative for shortness of breath and wheezing.   Gastrointestinal:  Positive for abdominal pain, nausea and vomiting. Negative for abdominal distention and diarrhea.  Musculoskeletal: Negative.      Physical Exam Triage Vital Signs ED Triage Vitals  Enc Vitals Group     BP      Pulse      Resp      Temp      Temp src      SpO2      Weight      Height      Head Circumference      Peak Flow      Pain Score      Pain Loc      Pain Edu?      Excl. in [place]?    No data found.  Updated Vital Signs BP (!) 158/118 (BP Location: Left Arm)   Pulse 90   Temp 98 F (36.7 C) (Oral)   Resp 12   SpO2 96%   Visual Acuity Right Eye Distance:   Left Eye Distance:   Bilateral Distance:    Right Eye Near:   Left Eye Near:    Bilateral Near:     Physical Exam Vitals and nursing note reviewed.  Constitutional:      General: She is in acute distress.     Appearance: She is  well-developed.  Cardiovascular:     Rate and Rhythm: Normal rate and regular rhythm.  Pulmonary:     Effort: Pulmonary effort is normal.     Breath sounds: Rhonchi present. No rales.  Chest:     Chest wall: No tenderness.  Abdominal:     General: Bowel sounds are normal.     Palpations: Abdomen is soft. There is no hepatomegaly or splenomegaly.     Tenderness: There is abdominal tenderness in the right upper quadrant, epigastric area and left upper quadrant. There is no guarding or rebound. Negative signs include Murphy's sign.     Hernia: No hernia is present.  Neurological:     Mental Status: She is alert.      UC Treatments / Results  Labs (all labs ordered are listed, but only abnormal results are displayed) Labs Reviewed - No data to display  EKG   Radiology No results found.  Procedures Procedures (including critical care time)  Medications Ordered in UC Medications  ondansetron (ZOFRAN-ODT) disintegrating tablet 4 mg (4 mg Oral Given 08/06/22 1716)  lidocaine (XYLOCAINE) 2 % viscous mouth solution 15 mL (15 mLs Mouth/Throat Given 08/06/22 1718)  alum & mag hydroxide-simeth (MAALOX/MYLANTA) 200-200-20 MG/5ML suspension 30 mL (30 mLs Oral Given 08/06/22 1718)    Initial Impression / Assessment and Plan / UC Course  I have reviewed the triage vital signs and the nursing notes.  Pertinent labs & imaging results that were available during my care of the patient were reviewed by me and considered in my medical decision making (see chart for  details).     1.  Acute superficial gastritis without bleeding: GI cocktail x1 dose was in the urgent care Protonix Carafate Besides Carafate which the patient is advised to take before eating, patient is advised to take other medications after she eats. Avoid NSAIDs use If abdominal pain worsens please go to the emergency department.   Final Clinical Impressions(s) / UC Diagnoses   Final diagnoses:  Acute superficial  gastritis without hemorrhage     Discharge Instructions      Maintain adequate hydration Please take Carafate before you eat, for the other prescribed medications please eat before you take them Avoid taking ibuprofen or other nonsteroidal anti-inflammatory agents If you have worsening abdominal pain please go to the emergency room to be evaluated further.     ED Prescriptions     Medication Sig Dispense Auth. Provider   pantoprazole (PROTONIX) 20 MG tablet Take 1 tablet (20 mg total) by mouth daily. 14 tablet Rifka Ramey, Britta Mccreedy, MD   ondansetron (ZOFRAN-ODT) 4 MG disintegrating tablet Take 1 tablet (4 mg total) by mouth every 8 (eight) hours as needed for nausea or vomiting. 20 tablet Lakedra Washington, Britta Mccreedy, MD   sucralfate (CARAFATE) 1 GM/10ML suspension Take 10 mLs (1 g total) by mouth 4 (four) times daily -  with meals and at bedtime for 3 days. 120 mL Teagan Heidrick, Britta Mccreedy, MD      PDMP not reviewed this encounter.   Merrilee Jansky, MD 08/07/22 1304

## 2022-08-08 ENCOUNTER — Telehealth: Payer: Self-pay | Admitting: Nurse Practitioner

## 2022-08-08 NOTE — Telephone Encounter (Signed)
Patient called - she is still out of work after going to the ED on 11/14.  She was advised by the ED department to get her doctor to fill out a form so she can get paid while she is out.   I told her to contact her HR or Benefits department and have them fax [place] an FMLA form.  She works for [employer].  I also let her know about the $29 processing fee.  She was coughing the whole time I was talking to her and had a very raspy voice.   She has an appointment with Dr. Delton Coombes on 11/22.

## 2022-08-14 ENCOUNTER — Ambulatory Visit (INDEPENDENT_AMBULATORY_CARE_PROVIDER_SITE_OTHER): Payer: BC Managed Care – PPO | Admitting: Emergency Medicine

## 2022-08-14 ENCOUNTER — Encounter: Payer: Self-pay | Admitting: Emergency Medicine

## 2022-08-14 VITALS — BP 132/76 | HR 83 | Temp 98.4°F | Ht 64.0 in | Wt 195.0 lb

## 2022-08-14 DIAGNOSIS — D869 Sarcoidosis, unspecified: Secondary | ICD-10-CM | POA: Diagnosis not present

## 2022-08-14 DIAGNOSIS — J4520 Mild intermittent asthma, uncomplicated: Secondary | ICD-10-CM | POA: Diagnosis not present

## 2022-08-14 DIAGNOSIS — J45909 Unspecified asthma, uncomplicated: Secondary | ICD-10-CM | POA: Insufficient documentation

## 2022-08-14 DIAGNOSIS — K219 Gastro-esophageal reflux disease without esophagitis: Secondary | ICD-10-CM

## 2022-08-14 DIAGNOSIS — J310 Chronic rhinitis: Secondary | ICD-10-CM | POA: Diagnosis not present

## 2022-08-14 NOTE — Assessment & Plan Note (Signed)
Continue fluticasone and loratadine

## 2022-08-14 NOTE — Assessment & Plan Note (Signed)
Mild asthma associated with her sarcoidosis.  Well-controlled currently on Symbicort.  Continue to control other exacerbating contributors including rhinitis and GERD.

## 2022-08-14 NOTE — Progress Notes (Signed)
Subjective:    Patient ID: [name], female    DOB: [birth year], 56 y.o.   MRN: [id number]  HPI  ROV 06/12/22 --Ms. Mcilrath is 38 and has a history of sarcoidosis with both pulmonary and cutaneous manifestations.  She has associated mild obstructive lung disease, deals with chronic cough that is often impacted by her GERD.  Her most recent CT chest was January 2023, stable as above.  Currently managed on albuterol as needed.  She is on omeprazole 40 mg daily She has been experiencing dry cough for 3-4 days, waking her from sleep and present all through the days. She has used albuterol without any response. She is not having any GERD breakthrough. No real inciting event or URI sx.   ROV 07/11/2022 --56 year old woman with a history of sarcoidosis and associated mild obstructive lung disease.  She has had pulmonary and cutaneous manifestations of her sarcoid.  She has chronic cough, often exacerbated by GERD, also possibly allergic rhinitis.  She was treated for flaring cough with prednisone when I last saw her 1 month ago.  Subsequently received antibiotics 10/9 in case there was an infectious component here.  I also started her on loratadine in addition to her omeprazole. She returns today stating that she is having cough, slightly improved compared with last visit but still present.  She has left-sided rib/costal margin pain.  She began to have bilateral leg pain, upper leg anterior. No edema. No dyspnea. She is using albuterol for the cough, sometimes wakes her from sleep.    ROV 08/14/22 --56 year old woman with sarcoidosis, associated mild obstructive lung disease.  She has both pulmonary and cutaneous manifestations of her sarcoid, last CT chest was 07/11/2022 when she was experiencing cough, rib pain.  No pulmonary embolism.  Some bilateral upper lobe opacities and bronchial wall thickening.  After that CT she was treated with prednisone taper and azithromycin for persistent coughing.   Unfortunately she could not tolerate these, had GI upset and they were discontinued. Today she reports that she is feeling better - she is not having cough or SOB right now. Rare albuterol use. No rash She is on Symbicort, Flonase, Protonix.  Review of Systems  As per HPI     Objective:   Physical Exam Vitals:   08/14/22 1551  BP: 132/76  Pulse: 83  Temp: 98.4 F (36.9 C)  TempSrc: Oral  SpO2: 99%  Weight: 195 lb (88.5 kg)  Height: 5\' 4"  (1.626 m)   Gen: Pleasant, well-nourished, in no distress,  normal affect, occasional cough  ENT: No lesions,  mouth clear,  oropharynx clear, no postnasal drip  Neck: No JVD, no stridor  Lungs: No use of accessory muscles, no crackles, no wheezes.   Cardiovascular: RRR, heart sounds normal, no murmur or gallops, no peripheral edema  Musculoskeletal: No deformities, tender to palpation left costal margin extending down into the upper abdomen  Neuro: alert, non focal  Skin: no rash on the arms or face      Assessment & Plan:  Sarcoidosis (HCC) With pulmonary and cutaneous manifestations.  Her symptoms are better -no cough, no dyspnea, good functional capacity.  She also has not seen any rash.  Seems to improved with time as she did not tolerate prednisone taper or azithromycin.  Her CT-PA that was done 10/19 showed increased bilateral apical opacities compared with prior, concerning for active sarcoid.  At this point we will plan to follow her clinically, follow a repeat CT scan of the  chest at the 66-month mark.  Depending on that result in a clinical status we will have to decide whether to treat her with a more extended course of steroids.  Asthma Mild asthma associated with her sarcoidosis.  Well-controlled currently on Symbicort.  Continue to control other exacerbating contributors including rhinitis and GERD.  GERD (gastroesophageal reflux disease) Continue PPI  Chronic rhinitis Continue fluticasone and loratadine    Levy Pupa, MD, PhD 08/14/2022, 4:13 PM [facility] [phone] or if no answer [phone]

## 2022-08-14 NOTE — Patient Instructions (Addendum)
We will perform a repeat CT scan of the chest without contrast in April 2024 to follow your sarcoidosis. Please continue Symbicort 2 puffs twice a day.  Rinse and gargle after using. Keep albuterol available to use 2 puffs up to every 4 hours if needed for shortness of breath, chest tightness, wheezing.  Continue your Flonase nasal spray, 2 sprays each nostril once daily. Continue loratadine 10 mg once daily Continue Protonix as you have been taking it Please call our office to let us know if you have any changes in your breathing, new respiratory symptoms.  Also let us know if you develop new rash. You should follow with ophthalmology once a year Follow with Dr. Delton Coombes in April after your CT scan or sooner if you have any problems.

## 2022-08-14 NOTE — Assessment & Plan Note (Signed)
With pulmonary and cutaneous manifestations.  Her symptoms are better -no cough, no dyspnea, good functional capacity.  She also has not seen any rash.  Seems to improved with time as she did not tolerate prednisone taper or azithromycin.  Her CT-PA that was done 10/19 showed increased bilateral apical opacities compared with prior, concerning for active sarcoid.  At this point we will plan to follow her clinically, follow a repeat CT scan of the chest at the 13-month mark.  Depending on that result in a clinical status we will have to decide whether to treat her with a more extended course of steroids.

## 2022-08-14 NOTE — Assessment & Plan Note (Signed)
Continue PPI ?

## 2022-08-29 ENCOUNTER — Telehealth: Payer: Self-pay | Admitting: Emergency Medicine

## 2022-08-29 NOTE — Telephone Encounter (Signed)
Called and spoke with pt who states she was having pain in her legs and due to the pain, she ended up falling. Due to her having sarcoidosis, she wanted to know if that might be a reason to why she was having the pain that caused her to fall.  I did tell pt to contact PCP to let them know that she did fall yesterday but also let her know that I would check with RB about this to to see what he had to say.  Dr. Delton Coombes, please advise.

## 2022-08-29 NOTE — Telephone Encounter (Signed)
Called and spoke with pt letting her know the info per RB and she verbalized understanding. Nothing further needed. ?

## 2022-08-29 NOTE — Telephone Encounter (Signed)
I do not think her LE pain is related to active sarcoidosis - has been associated with edema as opposed to focal joint pain. I agree that she needs to see her PCP

## 2022-12-27 ENCOUNTER — Ambulatory Visit: Payer: BC Managed Care – PPO | Admitting: Primary Care

## 2023-01-01 ENCOUNTER — Ambulatory Visit
Admission: RE | Admit: 2023-01-01 | Discharge: 2023-01-01 | Disposition: A | Discharge status: Home / Self Care | Payer: BC Managed Care – PPO | Source: Ambulatory Visit | Attending: Emergency Medicine | Admitting: Emergency Medicine

## 2023-01-01 DIAGNOSIS — D869 Sarcoidosis, unspecified: Secondary | ICD-10-CM

## 2023-01-07 ENCOUNTER — Ambulatory Visit (HOSPITAL_COMMUNITY): Payer: BC Managed Care – PPO

## 2023-01-10 ENCOUNTER — Encounter: Payer: Self-pay | Admitting: Emergency Medicine

## 2023-01-10 ENCOUNTER — Ambulatory Visit (INDEPENDENT_AMBULATORY_CARE_PROVIDER_SITE_OTHER): Payer: BC Managed Care – PPO | Admitting: Emergency Medicine

## 2023-01-10 VITALS — BP 126/74 | HR 70 | Temp 97.7°F | Ht 64.0 in | Wt 196.2 lb

## 2023-01-10 DIAGNOSIS — J4520 Mild intermittent asthma, uncomplicated: Secondary | ICD-10-CM | POA: Diagnosis not present

## 2023-01-10 DIAGNOSIS — D869 Sarcoidosis, unspecified: Secondary | ICD-10-CM

## 2023-01-10 DIAGNOSIS — J310 Chronic rhinitis: Secondary | ICD-10-CM | POA: Diagnosis not present

## 2023-01-10 DIAGNOSIS — K219 Gastro-esophageal reflux disease without esophagitis: Secondary | ICD-10-CM

## 2023-01-10 NOTE — Assessment & Plan Note (Signed)
Continue fluticasone nasal spray, loratadine as you have been taking them

## 2023-01-10 NOTE — Assessment & Plan Note (Signed)
Continue Symbicort 2 puffs twice a day.  Rinse and gargle after using. Keep albuterol available to use 2 puffs up to every 4 hours if needed for shortness of breath, chest tightness, wheezing.  Follow Dr. Delton Coombes in 1 year, sooner if you have any problems.

## 2023-01-10 NOTE — Patient Instructions (Addendum)
We reviewed your CT scan of the chest today. We will plan to repeat your CT without contrast in April 2025 to follow your sarcoidosis Continue Symbicort 2 puffs twice a day.  Rinse and gargle after using. Keep albuterol available to use 2 puffs up to every 4 hours if needed for shortness of breath, chest tightness, wheezing.  Continue fluticasone nasal spray, loratadine as you have been taking them Continue pantoprazole as you have been taking Follow Dr. Delton Coombes in 1 year, sooner if you have any problems.

## 2023-01-10 NOTE — Assessment & Plan Note (Signed)
Her CT chest from 01/01/2023 shows improvement in apical parenchymal infiltrates, now back to her previous baseline.  She is clinically well.  I think we can plan to repeat her CT chest in 1 year.  We can repeat sooner if she develops any symptoms.

## 2023-01-10 NOTE — Assessment & Plan Note (Signed)
Continue pantoprazole as you have been taking

## 2023-01-10 NOTE — Progress Notes (Signed)
   Subjective:    Patient ID: Hannah Ellis, female    DOB: 06-14-1966, 57 y.o.   MRN: [id number]  HPI  ROV 01/10/23 --follow-up visit 57 year old woman with sarcoidosis with pulmonary and cutaneous involvement, mild associated COPD.  She had a CT 06/2022 with some bilateral upper lobe opacities and bronchial wall thickening.  We decided to defer corticosteroids but to follow with serial imaging.  She is on Symbicort, pantoprazole, fluticasone and loratadine.  Doing well, no flares. No albuterol use. No cough.   CT scan of the chest/10/24 reviewed by me shows improvement in her patchy groundglass nodularity in the bilateral upper lobes compared with 06/2022 no mediastinal or hilar adenopathy  Review of Systems  As per HPI     Objective:   Physical Exam Vitals:   01/10/23 0828  BP: 126/74  Pulse: 70  Temp: 97.7 F (36.5 C)  TempSrc: Oral  SpO2: 98%  Weight: 196 lb 3.2 oz (89 kg)  Height:  (1.626 m)   Gen: Pleasant, well-nourished, in no distress,  normal affect, occasional cough  ENT: No lesions,  mouth clear,  oropharynx clear, no postnasal drip  Neck: No JVD, no stridor  Lungs: No use of accessory muscles, no crackles, no wheezes.   Cardiovascular: RRR, heart sounds normal, no murmur or gallops, no peripheral edema  Musculoskeletal: No deformities, tender to palpation left costal margin extending down into the upper abdomen  Neuro: alert, non focal  Skin: no rash on the arms.  Some subtle erythema on forehead, dryness at eyebrows but no overt rash      Assessment & Plan:  Sarcoidosis (HCC) Her CT chest from 01/01/2023 shows improvement in apical parenchymal infiltrates, now back to her previous baseline.  She is clinically well.  I think we can plan to repeat her CT chest in 1 year.  We can repeat sooner if she develops any symptoms.  Asthma Continue Symbicort 2 puffs twice a day.  Rinse and gargle after using. Keep albuterol available to use 2 puffs up to  every 4 hours if needed for shortness of breath, chest tightness, wheezing.  Follow Dr. Delton Coombes in 1 year, sooner if you have any problems.  Chronic rhinitis Continue fluticasone nasal spray, loratadine as you have been taking them  GERD (gastroesophageal reflux disease) Continue pantoprazole as you have been taking    Levy Pupa, MD, PhD 01/10/2023, 8:45 AM [facility] [phone] or if no answer [phone]

## 2023-04-16 ENCOUNTER — Telehealth: Payer: Self-pay | Admitting: Emergency Medicine

## 2023-04-16 NOTE — Telephone Encounter (Signed)
Pt called in bc she just found out she has covid and wants to know if it will mess with her pulmonary sarcoidosis

## 2023-04-16 NOTE — Telephone Encounter (Signed)
Spoke with patient regarding prior message. Per Dr. Delton Coombes   If she has progressive symptoms especially SOB, wheezing, then she needs to call us. She should take the meds prescribed by her PCP   Patient's voice was understanding. Nothing else further needed.

## 2023-04-16 NOTE — Telephone Encounter (Signed)
Called and spoke with patient. Patient stated she tested positive yesterday for covid with at home test.  Patient stated she was seen in her PCP office today and again tested covid positive.  Patient stated she has head/chest congestion and cough. Patient stated PCP has sent medication to her pharmacy.  Patient stated she wanted Dr. Delton Coombes to know she had to tested positive for covid and how it may effect her pulmonary sarcoidosis.  I did advise patient covid could effect people different.      Message routed to Dr. Delton Coombes to advise

## 2023-04-16 NOTE — Telephone Encounter (Signed)
If she has progressive symptoms especially SOB, wheezing, then she needs to call us. She should take the meds prescribed by her PCP

## 2023-04-23 ENCOUNTER — Other Ambulatory Visit (INDEPENDENT_AMBULATORY_CARE_PROVIDER_SITE_OTHER): Payer: BC Managed Care – PPO

## 2023-04-23 ENCOUNTER — Ambulatory Visit (INDEPENDENT_AMBULATORY_CARE_PROVIDER_SITE_OTHER): Payer: BC Managed Care – PPO | Admitting: Orthopaedic Surgery

## 2023-04-23 VITALS — Ht 64.0 in | Wt 196.0 lb

## 2023-04-23 DIAGNOSIS — M25561 Pain in right knee: Secondary | ICD-10-CM

## 2023-04-23 DIAGNOSIS — G8929 Other chronic pain: Secondary | ICD-10-CM

## 2023-04-23 DIAGNOSIS — M25562 Pain in left knee: Secondary | ICD-10-CM | POA: Diagnosis not present

## 2023-04-23 MED ORDER — DICLOFENAC SODIUM 75 MG PO TBEC: 75.0000 mg | DELAYED_RELEASE_TABLET | Freq: Two times a day (BID) | ORAL | 1 refills | Status: AC | PRN

## 2023-04-23 NOTE — Progress Notes (Signed)
The patient is a very pleasant and active 57 year old female with bilateral knee pain for many years now on or off.  Her right knee hurts worse than the left knee.  She has had a little bit of swelling on and off as well.  She has had more pain with activity and walking.  She is on her feet all day working in housekeeping.  She is never had surgery on her knees and she has not had any type of injections on her knees.  She has not been to therapy either.  She denies any specific injury.  She is not diabetic.  She does have pulmonary issues.  She is not on home oxygen.  Examination of both knees shows that she does have slight valgus malalignment when she walks.  Both knees actually hyperextend and have full flexion.  Both knees patellas track well.  There is no effusion of either knee and they both feel ligamentously stable.  2 views of each knee show no acute findings and no significant malalignment.  They made a lateral compartments well-maintained.  We talked about strengthening exercises for her quads and I gave her handout on knee conditioning.  Will start her on diclofenac as an anti-inflammatory.  We will hold off on any type of injections but my next step would be considering steroid injections and formal physical therapy for her knees.  I have also recommended over-the-counter copper fit knee sleeves to try.  She agrees with this treatment plan.  We can see her back in 4 weeks to see how she is doing overall.

## 2023-05-21 ENCOUNTER — Encounter: Payer: Self-pay | Admitting: Orthopaedic Surgery

## 2023-05-21 ENCOUNTER — Ambulatory Visit (INDEPENDENT_AMBULATORY_CARE_PROVIDER_SITE_OTHER): Payer: BC Managed Care – PPO | Admitting: Orthopaedic Surgery

## 2023-05-21 DIAGNOSIS — G8929 Other chronic pain: Secondary | ICD-10-CM

## 2023-05-21 DIAGNOSIS — M25561 Pain in right knee: Secondary | ICD-10-CM

## 2023-05-21 DIAGNOSIS — M25562 Pain in left knee: Secondary | ICD-10-CM

## 2023-05-21 NOTE — Progress Notes (Signed)
The patient is an active 57 year old female who comes in for follow-up after we saw her for chronic bilateral knee pain.  Her x-rays showed well-maintained joint space.  When she walks her knees hyperextend and they have some slight valgus malalignment.  We did recommend anti-inflammatories as well as quad strengthening exercises and knee sleeves.  She comes in today stating her knees are still bothersome but she is feeling much better overall and still not interested in any type of injections or therapy.  On exam both knees are stable ligamentously.  They slightly hyperextend but have excellent range of motion.  Since she is doing much better overall follow-up can be as needed for her knees.  She should work on activity modification and weight loss and continue quad strengthening exercises.  If things worsen she knows to let us know.

## 2023-10-23 ENCOUNTER — Ambulatory Visit (HOSPITAL_COMMUNITY): Admission: EM | Admit: 2023-10-23 | Discharge: 2023-10-23 | Discharge status: Against Medical Advice | Payer: 59

## 2023-10-23 NOTE — ED Notes (Signed)
Patient called x1 for triage. No answer

## 2023-10-24 ENCOUNTER — Encounter (HOSPITAL_COMMUNITY): Payer: Self-pay

## 2023-10-24 ENCOUNTER — Ambulatory Visit (HOSPITAL_COMMUNITY)
Admission: EM | Admit: 2023-10-24 | Discharge: 2023-10-24 | Disposition: A | Discharge status: Home / Self Care | Payer: 59 | Attending: Emergency Medicine | Admitting: Emergency Medicine

## 2023-10-24 DIAGNOSIS — Z23 Encounter for immunization: Secondary | ICD-10-CM

## 2023-10-24 DIAGNOSIS — S91331A Puncture wound without foreign body, right foot, initial encounter: Secondary | ICD-10-CM

## 2023-10-24 MED ORDER — DOXYCYCLINE HYCLATE 100 MG PO CAPS: 100.0000 mg | ORAL_CAPSULE | Freq: Two times a day (BID) | ORAL | 0 refills | Status: DC

## 2023-10-24 MED ORDER — TETANUS-DIPHTH-ACELL PERTUSSIS 5-2.5-18.5 LF-MCG/0.5 IM SUSY
0.5000 mL | PREFILLED_SYRINGE | Freq: Once | INTRAMUSCULAR | Status: AC
  Administered 2023-10-24: 0.5 mL via INTRAMUSCULAR

## 2023-10-24 MED ORDER — TETANUS-DIPHTH-ACELL PERTUSSIS 5-2.5-18.5 LF-MCG/0.5 IM SUSY
PREFILLED_SYRINGE | INTRAMUSCULAR | Status: AC
  Filled 2023-10-24: qty 0.5

## 2023-10-24 NOTE — ED Triage Notes (Signed)
Patient states she stepped on a nail 2 days ago and wound is on the ball of her right foot.  Patient is unsure of last tetanus shot.

## 2023-10-24 NOTE — ED Provider Notes (Signed)
[facility]    CSN: [id number] Arrival date & time: 10/24/23  4098      History   Chief Complaint Chief Complaint  Patient presents with   Puncture Wound    HPI Hannah Ellis is a 58 y.o. female.   Patient present after stepping on a nail 2 days ago and now has pain to the plantar aspect of her foot.  Patient is unsure of her last tetanus shot.     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   Depression    GERD (gastroesophageal reflux disease)    Hypertension    Pneumonia ~ 2017   Sarcoidosis, lung (HCC)    Stroke (HCC) 05/09/2015   "vs TIA; mild"    TIA (transient ischemic attack) 05/09/2015   "vs CVA; denies residual on 02/17/2017)    Patient Active Problem List   Diagnosis Date Noted   Asthma 08/14/2022   Chronic rhinitis 08/14/2022   Costochondritis 07/18/2022   Chest pain 07/11/2022   Neck swelling 07/27/2021   Posterior right knee pain 07/27/2021   GERD (gastroesophageal reflux disease) 04/05/2021   Chronic cough 08/26/2017   Hypokalemia    Abdominal pain 02/17/2017   Pyelonephritis    Hemifacial spasm 10/29/2016   Sarcoidosis 10/15/2016   Encephalopathy, hypertensive 07/11/2015   Anxiety state 07/11/2015   Essential hypertension 07/11/2015   TIA (transient ischemic attack) 05/09/2015   Uncontrolled hypertension 05/09/2015   Patient nonadherence 05/09/2015   Microcytic anemia 05/09/2015    Past Surgical History:  Procedure Laterality Date   APPENDECTOMY     CESAREAN SECTION  1998   EYE SURGERY Left ~ 1972   FOOT SURGERY Right    "took some bone out of pinky and one next to it"   TUBAL LIGATION  1998    OB History   No obstetric history on file.      Home Medications    Prior to Admission medications   Medication Sig Start Date End Date Taking? Authorizing Provider  doxycycline (VIBRAMYCIN) 100 MG capsule Take 1 capsule (100 mg total) by mouth 2 (two)  times daily. 10/24/23  Yes Susann Givens, Valaree Fresquez A, NP  albuterol (VENTOLIN HFA) 108 (90 Base) MCG/ACT inhaler Inhale 1-2 puffs into the lungs every 6 (six) hours as needed for wheezing or shortness of breath. 04/05/21   Leslye Peer, MD  amLODipine (NORVASC) 10 MG tablet Take 1 tablet (10 mg total) by mouth daily. 08/27/20   Mesner, Barbara Cower, MD  aspirin EC 325 MG EC tablet Take 1 tablet (325 mg total) by mouth daily. 05/11/15   Joseph Art, DO  atorvastatin (LIPITOR) 10 MG tablet TAKE 1 TABLET(10 MG) BY MOUTH DAILY 04/02/21   [provider]  budesonide-formoterol (SYMBICORT) 160-4.5 MCG/ACT inhaler Inhale 2 puffs into the lungs in the morning and at bedtime. 08/01/22   Cobb, Ruby Cola, NP  chlorzoxazone (PARAFON) 500 MG tablet chlorzoxazone 500 mg tablet  TAKE 1 TABLET BY MOUTH UP TO FOUR TIMES DAILY AS NEEDED FOR HEADACHE 01/01/21   [provider]  diclofenac (VOLTAREN) 75 MG EC tablet Take 1 tablet (75 mg total) by mouth 2 (two) times daily between meals as needed. 04/23/23   Kathryne Hitch, MD  ferrous sulfate 325 (65 FE) MG tablet Take 325 mg by mouth daily with breakfast.    [provider]  fluticasone (FLONASE) 50 MCG/ACT nasal spray  Place 1 spray into both nostrils daily. 08/01/22   Cobb, Ruby Cola, NP  gabapentin (NEURONTIN) 300 MG capsule gabapentin 300 mg capsule  TAKE 1 CAPSULE BY MOUTH EVERY DAY AT BEDTIME    [provider]  hydrALAZINE (APRESOLINE) 25 MG tablet Take 1 tablet (25 mg total) by mouth 2 (two) times daily. Patient taking differently: Take 50 mg by mouth 2 (two) times daily. 08/27/20   Mesner, Barbara Cower, MD  hydrALAZINE (APRESOLINE) 50 MG tablet Take 50 mg by mouth 3 (three) times daily. 09/14/21   [provider]  hydrochlorothiazide (HYDRODIURIL) 25 MG tablet Take 1 tablet (25 mg total) by mouth daily. 08/27/20   Mesner, Barbara Cower, MD  ibuprofen (ADVIL) 600 MG tablet Take 1 tablet (600 mg total) by mouth 3 (three) times  daily. Patient not taking: Reported on 01/10/2023 04/04/22   Ellsworth Lennox, PA-C  ipratropium-albuterol (DUONEB) 0.5-2.5 (3) MG/3ML SOLN Take 3 mLs by nebulization every 6 (six) hours as needed (shortness of breath or wheezing). 08/01/22   Cobb, Ruby Cola, NP  ondansetron (ZOFRAN-ODT) 4 MG disintegrating tablet Take 1 tablet (4 mg total) by mouth every 8 (eight) hours as needed for nausea or vomiting. 08/06/22   Merrilee Jansky, MD  pantoprazole (PROTONIX) 20 MG tablet Take 1 tablet (20 mg total) by mouth daily. 08/06/22   Lamptey, Britta Mccreedy, MD  predniSONE (DELTASONE) 10 MG tablet 4 tabs for 5 days, 3 tabs for 5 days, 2 tabs for 5 days, 1 tab for 5 days then stop 07/18/22   Cobb, Ruby Cola, NP  sucralfate (CARAFATE) 1 GM/10ML suspension Take 10 mLs (1 g total) by mouth 4 (four) times daily -  with meals and at bedtime for 3 days. 08/06/22 08/09/22  LampteyBritta Mccreedy, MD  tiZANidine (ZANAFLEX) 4 MG tablet Take 1 tablet (4 mg total) by mouth every 6 (six) hours as needed for muscle spasms. 04/04/22   Ellsworth Lennox, PA-C  topiramate (TOPAMAX) 25 MG tablet Take 3 tablets by mouth at bedtime. 01/01/21   [provider]  valsartan (DIOVAN) 160 MG tablet Take 1 tablet (160 mg total) by mouth daily. 08/27/20   Mesner, Barbara Cower, MD    Family History Family History  Problem Relation Age of Onset   Hypertension Mother    Colon cancer Mother    Hypertension Father    Hypertension Brother     Social History Social History   Tobacco Use   Smoking status: Never   Smokeless tobacco: Never  Vaping Use   Vaping status: Never Used  Substance Use Topics   Alcohol use: No   Drug use: No     Allergies   Patient has no known allergies.   Review of Systems Review of Systems  Skin:  Positive for wound.     Physical Exam Triage Vital Signs ED Triage Vitals [10/24/23 0837]  Encounter Vitals Group     BP (!) 174/108     Systolic BP Percentile      Diastolic BP Percentile      Pulse Rate  79     Resp 16     Temp 98.4 F (36.9 C)     Temp Source Oral     SpO2 97 %     Weight      Height      Head Circumference      Peak Flow      Pain Score 9     Pain Loc      Pain Education  Exclude from Growth Chart    No data found.  Updated Vital Signs BP (!) 174/108 (BP Location: Left Arm) Comment: Paatient states she did not take her BP med today.  Pulse 79   Temp 98.4 F (36.9 C) (Oral)   Resp 16   LMP 11/07/2018 (Within Weeks)   SpO2 97%   Visual Acuity Right Eye Distance:   Left Eye Distance:   Bilateral Distance:    Right Eye Near:   Left Eye Near:    Bilateral Near:     Physical Exam Vitals and nursing note reviewed.  Constitutional:      General: She is awake. She is not in acute distress.    Appearance: Normal appearance. She is well-developed and well-groomed. She is not ill-appearing.  Musculoskeletal:       Feet:  Feet:     Comments: Puncture wound with mild surrounding induration noted to the plantar aspect of the foot located at the forefoot as pictured above.  Skin:    Findings: Wound present.  Neurological:     Mental Status: She is alert.  Psychiatric:        Behavior: Behavior is cooperative.      UC Treatments / Results  Labs (all labs ordered are listed, but only abnormal results are displayed) Labs Reviewed - No data to display  EKG   Radiology No results found.  Procedures Procedures (including critical care time)  Medications Ordered in UC Medications  Tdap (BOOSTRIX) injection 0.5 mL (0.5 mLs Intramuscular Given 10/24/23 0915)    Initial Impression / Assessment and Plan / UC Course  I have reviewed the triage vital signs and the nursing notes.  Pertinent labs & imaging results that were available during my care of the patient were reviewed by me and considered in my medical decision making (see chart for details).     Patient presented with puncture wound to the plantar aspect of her right foot after  stepping on a nail 2 days ago.  Patient is unsure of her last tetanus shot.  Upon assessment patient has puncture wound with mild surrounding induration noted to the plantar aspect of the forefoot as pictured under physical exam.   Given tetanus booster.  Prescribed doxycycline for wound infection coverage.  Discussed return precautions. Final Clinical Impressions(s) / UC Diagnoses   Final diagnoses:  Puncture wound of plantar aspect of right foot, initial encounter     Discharge Instructions      Start taking doxycycline twice daily for 10 days.  You can alternate between Tylenol and ibuprofen as needed for pain.  You can also alternate between heat and ice as needed.  Return here if you notice increased swelling, pain, pus drainage, or develop a fever.    ED Prescriptions     Medication Sig Dispense Auth. Provider   doxycycline (VIBRAMYCIN) 100 MG capsule Take 1 capsule (100 mg total) by mouth 2 (two) times daily. 20 capsule Wynonia Lawman A, NP      PDMP not reviewed this encounter.   Wynonia Lawman A, [place] 10/24/23 [phone]

## 2023-10-24 NOTE — Discharge Instructions (Signed)
Start taking doxycycline twice daily for 10 days.  You can alternate between Tylenol and ibuprofen as needed for pain.  You can also alternate between heat and ice as needed.  Return here if you notice increased swelling, pain, pus drainage, or develop a fever.

## 2023-12-26 ENCOUNTER — Other Ambulatory Visit

## 2024-01-10 ENCOUNTER — Emergency Department (HOSPITAL_COMMUNITY)
Admission: EM | Admit: 2024-01-10 | Discharge: 2024-01-11 | Disposition: A | Discharge status: Home / Self Care | Payer: Self-pay | Attending: Emergency Medicine | Admitting: Emergency Medicine

## 2024-01-10 ENCOUNTER — Encounter (HOSPITAL_COMMUNITY): Payer: Self-pay | Admitting: Emergency Medicine

## 2024-01-10 ENCOUNTER — Emergency Department (HOSPITAL_COMMUNITY): Payer: Self-pay

## 2024-01-10 DIAGNOSIS — R109 Unspecified abdominal pain: Secondary | ICD-10-CM | POA: Insufficient documentation

## 2024-01-10 DIAGNOSIS — J189 Pneumonia, unspecified organism: Secondary | ICD-10-CM | POA: Insufficient documentation

## 2024-01-10 DIAGNOSIS — K219 Gastro-esophageal reflux disease without esophagitis: Secondary | ICD-10-CM | POA: Insufficient documentation

## 2024-01-10 DIAGNOSIS — Z7982 Long term (current) use of aspirin: Secondary | ICD-10-CM | POA: Insufficient documentation

## 2024-01-10 DIAGNOSIS — I10 Essential (primary) hypertension: Secondary | ICD-10-CM | POA: Insufficient documentation

## 2024-01-10 DIAGNOSIS — Z79899 Other long term (current) drug therapy: Secondary | ICD-10-CM | POA: Insufficient documentation

## 2024-01-10 DIAGNOSIS — J984 Other disorders of lung: Secondary | ICD-10-CM

## 2024-01-10 LAB — CBC WITH DIFFERENTIAL/PLATELET
Abs Immature Granulocytes: 0.03 10*3/uL (ref 0.00–0.07)
Basophils Absolute: 0 10*3/uL (ref 0.0–0.1)
Basophils Relative: 0 %
Eosinophils Absolute: 0.1 10*3/uL (ref 0.0–0.5)
Eosinophils Relative: 2 %
HCT: 45.9 % (ref 36.0–46.0)
Hemoglobin: 14.9 g/dL (ref 12.0–15.0)
Immature Granulocytes: 0 %
Lymphocytes Relative: 17 %
Lymphs Abs: 1.3 10*3/uL (ref 0.7–4.0)
MCH: 27 pg (ref 26.0–34.0)
MCHC: 32.5 g/dL (ref 30.0–36.0)
MCV: 83.2 fL (ref 80.0–100.0)
Monocytes Absolute: 0.5 10*3/uL (ref 0.1–1.0)
Monocytes Relative: 7 %
Neutro Abs: 5.8 10*3/uL (ref 1.7–7.7)
Neutrophils Relative %: 74 %
Platelets: 157 10*3/uL (ref 150–400)
RBC: 5.52 MIL/uL — ABNORMAL HIGH (ref 3.87–5.11)
RDW: 15.9 % — ABNORMAL HIGH (ref 11.5–15.5)
WBC: 7.9 10*3/uL (ref 4.0–10.5)
nRBC: 0 % (ref 0.0–0.2)

## 2024-01-10 LAB — URINALYSIS, ROUTINE W REFLEX MICROSCOPIC
Bilirubin Urine: NEGATIVE
Glucose, UA: NEGATIVE mg/dL
Hgb urine dipstick: NEGATIVE
Ketones, ur: NEGATIVE mg/dL
Leukocytes,Ua: NEGATIVE
Nitrite: NEGATIVE
Protein, ur: NEGATIVE mg/dL
Specific Gravity, Urine: 1.016 (ref 1.005–1.030)
pH: 6 (ref 5.0–8.0)

## 2024-01-10 LAB — COMPREHENSIVE METABOLIC PANEL WITH GFR
ALT: 20 U/L (ref 0–44)
AST: 20 U/L (ref 15–41)
Albumin: 3.5 g/dL (ref 3.5–5.0)
Alkaline Phosphatase: 78 U/L (ref 38–126)
Anion gap: 10 (ref 5–15)
BUN: 10 mg/dL (ref 6–20)
CO2: 22 mmol/L (ref 22–32)
Calcium: 9 mg/dL (ref 8.9–10.3)
Chloride: 108 mmol/L (ref 98–111)
Creatinine, Ser: 1.03 mg/dL — ABNORMAL HIGH (ref 0.44–1.00)
GFR, Estimated: >60 mL/min (ref >60)
Glucose, Bld: 111 mg/dL — ABNORMAL HIGH (ref 70–99)
Potassium: 3.6 mmol/L (ref 3.5–5.1)
Sodium: 140 mmol/L (ref 135–145)
Total Bilirubin: 0.8 mg/dL (ref 0.0–1.2)
Total Protein: 6.6 g/dL (ref 6.5–8.1)

## 2024-01-10 LAB — LIPASE, BLOOD: Lipase: 34 U/L (ref 11–51)

## 2024-01-10 MED ORDER — ONDANSETRON 4 MG PO TBDP
4.0000 mg | ORAL_TABLET | Freq: Once | ORAL | Status: AC
  Administered 2024-01-10: 4 mg via ORAL
  Filled 2024-01-10: qty 1

## 2024-01-10 NOTE — ED Provider Triage Note (Signed)
 Emergency Medicine Provider Triage Evaluation Note  Hannah Ellis , a 58 y.o. female  was evaluated in triage.  Pt complains of abdominal pain.  Patient reports that she has been experiencing left-sided abdominal/flank pain for the last 2 days.  Endorses some nausea and some pain with deep inhalation but denies any chest pain or shortness of breath at rest.  No recent vomiting or diarrhea.  Review of Systems  Positive: As above Negative: As above  Physical Exam  BP (!) 181/108 (BP Location: Right Arm)   Pulse (!) 103   Temp 99.2 F (37.3 C)   Resp (!) 24   LMP 11/07/2018 (Within Weeks)   SpO2 92%  Gen:   Awake, no distress   Resp:  Normal effort  MSK:   Moves extremities without difficulty  Other:  Tenderness to palpation in the left upper quadrant, left lower quadrant, and left lateral abdomen.  No flank tenderness.  Medical Decision Making  Medically screening exam initiated at 5:21 PM.  Appropriate orders placed.  [name] was informed that the remainder of the evaluation will be completed by another provider, this initial triage assessment does not replace that evaluation, and the importance of remaining in the ED until their evaluation is complete.     Elveria Lauderbaugh A, PA-C 01/10/24 1723

## 2024-01-10 NOTE — ED Triage Notes (Signed)
 Pt here from home with c/o abd pain and left flank area pain that  started 2 days ago , some nausea and sob noted

## 2024-01-11 MED ORDER — PREDNISONE 20 MG PO TABS: ORAL_TABLET | ORAL | 0 refills | Status: DC

## 2024-01-11 MED ORDER — BENZONATATE 100 MG PO CAPS: 100.0000 mg | ORAL_CAPSULE | Freq: Three times a day (TID) | ORAL | 0 refills | Status: AC

## 2024-01-11 MED ORDER — IOHEXOL 350 MG/ML SOLN
75.0000 mL | Freq: Once | INTRAVENOUS | Status: AC | PRN
  Administered 2024-01-11: 75 mL via INTRAVENOUS

## 2024-01-11 MED ORDER — PREDNISONE 20 MG PO TABS
60.0000 mg | ORAL_TABLET | Freq: Once | ORAL | Status: AC
  Administered 2024-01-11: 60 mg via ORAL
  Filled 2024-01-11: qty 3

## 2024-01-11 NOTE — ED Provider Notes (Signed)
 [place] [facility] AT [facility] Provider Note   CSN: [id number] Arrival date & time: 01/10/24  1646     History  Chief Complaint  Patient presents with   Abdominal Pain    Hannah Ellis is a 58 y.o. female.  The history is provided by the patient and medical records.  Abdominal Pain  58 y.o. F with hx of HTN, sarcoidosis, GERD, prior stroke, depression, presenting to the ED with abdominal pain.  States she feels it mostly in her left flank area.  She has had some nausea but denies any vomiting.  No diarrhea, bowel movements have been normal.  She has had a little bit of a cough recently.  Has occasional flares of her sarcoidosis.  She has not had any fever or chills.  No sick contacts.  Home Medications Prior to Admission medications   Medication Sig Start Date End Date Taking? Authorizing Provider  benzonatate  (TESSALON ) 100 MG capsule Take 1 capsule (100 mg total) by mouth every 8 (eight) hours. 01/11/24  Yes Coretha Dew, PA-C  predniSONE  (DELTASONE ) 20 MG tablet Take 40 mg by mouth daily for 3 days, then 20mg  by mouth daily for 3 days, then 10mg  daily for 3 days 01/11/24  Yes Coretha Dew, PA-C  albuterol  (VENTOLIN  HFA) 108 (90 Base) MCG/ACT inhaler Inhale 1-2 puffs into the lungs every 6 (six) hours as needed for wheezing or shortness of breath. 04/05/21   Byrum, Robert S, MD  amLODipine  (NORVASC ) 10 MG tablet Take 1 tablet (10 mg total) by mouth daily. 08/27/20   Mesner, Reymundo Caulk, MD  aspirin  EC 325 MG EC tablet Take 1 tablet (325 mg total) by mouth daily. 05/11/15   Vann, Jessica U, DO  atorvastatin  (LIPITOR) 10 MG tablet TAKE 1 TABLET(10 MG) BY MOUTH DAILY 04/02/21   [provider]  budesonide -formoterol  (SYMBICORT ) 160-4.5 MCG/ACT inhaler Inhale 2 puffs into the lungs in the morning and at bedtime. 08/01/22   Cobb, Katherine V, NP  chlorzoxazone (PARAFON) 500 MG tablet chlorzoxazone 500 mg tablet  TAKE 1 TABLET BY MOUTH UP TO FOUR TIMES  DAILY AS NEEDED FOR HEADACHE 01/01/21   [provider]  diclofenac  (VOLTAREN ) 75 MG EC tablet Take 1 tablet (75 mg total) by mouth 2 (two) times daily between meals as needed. 04/23/23   Arnie Lao, MD  doxycycline  (VIBRAMYCIN ) 100 MG capsule Take 1 capsule (100 mg total) by mouth 2 (two) times daily. 10/24/23   Levora Reas A, NP  ferrous sulfate 325 (65 FE) MG tablet Take 325 mg by mouth daily with breakfast.    [provider]  fluticasone  (FLONASE ) 50 MCG/ACT nasal spray Place 1 spray into both nostrils daily. 08/01/22   Cobb, Mariah Shines, NP  gabapentin (NEURONTIN) 300 MG capsule gabapentin 300 mg capsule  TAKE 1 CAPSULE BY MOUTH EVERY DAY AT BEDTIME    [provider]  hydrALAZINE  (APRESOLINE ) 25 MG tablet Take 1 tablet (25 mg total) by mouth 2 (two) times daily. Patient taking differently: Take 50 mg by mouth 2 (two) times daily. 08/27/20   Mesner, Reymundo Caulk, MD  hydrALAZINE  (APRESOLINE ) 50 MG tablet Take 50 mg by mouth 3 (three) times daily. 09/14/21   [provider]  hydrochlorothiazide  (HYDRODIURIL ) 25 MG tablet Take 1 tablet (25 mg total) by mouth daily. 08/27/20   Mesner, Reymundo Caulk, MD  ibuprofen  (ADVIL ) 600 MG tablet Take 1 tablet (600 mg total) by mouth 3 (three) times daily. Patient not taking: Reported on 01/10/2023  04/04/22   Gretel Leaven, PA-C  ipratropium-albuterol  (DUONEB) 0.5-2.5 (3) MG/3ML SOLN Take 3 mLs by nebulization every 6 (six) hours as needed (shortness of breath or wheezing). 08/01/22   Cobb, Mariah Shines, NP  ondansetron  (ZOFRAN -ODT) 4 MG disintegrating tablet Take 1 tablet (4 mg total) by mouth every 8 (eight) hours as needed for nausea or vomiting. 08/06/22   LampteyDonley Furth, MD  pantoprazole  (PROTONIX ) 20 MG tablet Take 1 tablet (20 mg total) by mouth daily. 08/06/22   Lamptey, Donley Furth, MD  sucralfate  (CARAFATE ) 1 GM/10ML suspension Take 10 mLs (1 g total) by mouth 4 (four) times daily -  with meals and at bedtime for 3 days.  08/06/22 08/09/22  LampteyDonley Furth, MD  tiZANidine  (ZANAFLEX ) 4 MG tablet Take 1 tablet (4 mg total) by mouth every 6 (six) hours as needed for muscle spasms. 04/04/22   Gretel Leaven, PA-C  topiramate (TOPAMAX) 25 MG tablet Take 3 tablets by mouth at bedtime. 01/01/21   [provider]  valsartan  (DIOVAN ) 160 MG tablet Take 1 tablet (160 mg total) by mouth daily. 08/27/20   Mesner, Reymundo Caulk, MD      Allergies    Patient has no known allergies.    Review of Systems   Review of Systems  Gastrointestinal:  Positive for abdominal pain.  All other systems reviewed and are negative.   Physical Exam Updated Vital Signs BP (!) 178/103   Pulse 74   Temp 98.7 F (37.1 C) (Oral)   Resp 18   LMP 11/07/2018 (Within Weeks)   SpO2 99%   Physical Exam Vitals and nursing note reviewed.  Constitutional:      Appearance: She is well-developed.  HENT:     Head: Normocephalic and atraumatic.  Eyes:     Conjunctiva/sclera: Conjunctivae normal.     Pupils: Pupils are equal, round, and reactive to light.  Cardiovascular:     Rate and Rhythm: Normal rate and regular rhythm.     Heart sounds: Normal heart sounds.  Pulmonary:     Effort: Pulmonary effort is normal.     Breath sounds: Normal breath sounds.     Comments: Dry cough on exam, coarse breath sounds without noted wheezes or rhonchi, speaking in full sentences, no acute distress Abdominal:     General: Bowel sounds are normal.     Palpations: Abdomen is soft.     Tenderness: There is no abdominal tenderness. There is no guarding or rebound.  Musculoskeletal:        General: Normal range of motion.     Cervical back: Normal range of motion.  Skin:    General: Skin is warm and dry.  Neurological:     Mental Status: She is alert and oriented to person, place, and time.     ED Results / Procedures / Treatments   Labs (all labs ordered are listed, but only abnormal results are displayed) Labs Reviewed  CBC WITH  DIFFERENTIAL/PLATELET - Abnormal; Notable for the following components:      Result Value   RBC 5.52 (*)    RDW 15.9 (*)    All other components within normal limits  COMPREHENSIVE METABOLIC PANEL WITH GFR - Abnormal; Notable for the following components:   Glucose, Bld 111 (*)    Creatinine, Ser 1.03 (*)    All other components within normal limits  LIPASE, BLOOD  URINALYSIS, ROUTINE W REFLEX MICROSCOPIC    EKG None  Radiology CT ABDOMEN PELVIS W CONTRAST Result Date:  01/11/2024 CLINICAL DATA:  Left flank and lower quadrant pain. EXAM: CT ABDOMEN AND PELVIS WITH CONTRAST TECHNIQUE: Multidetector CT imaging of the abdomen and pelvis was performed using the standard protocol following bolus administration of intravenous contrast. RADIATION DOSE REDUCTION: This exam was performed according to the departmental dose-optimization program which includes automated exposure control, adjustment of the mA and/or kV according to patient size and/or use of iterative reconstruction technique. CONTRAST:  75mL OMNIPAQUE  IOHEXOL  350 MG/ML SOLN COMPARISON:  CTs with IV contrast 02/12/2018 and 02/17/2017. FINDINGS: Lower chest: Interval increased patchy ground-glass disease in the posteromedial right lower lobe and infrahilar left lower lobe, findings most likely reflecting pneumonitis. There is linear scarring or atelectasis laterally in the right middle lobe. The lung bases are otherwise clear.  The cardiac size is normal. Hepatobiliary: No focal liver abnormality is seen. No gallstones, gallbladder wall thickening, or biliary dilatation. Pancreas: No abnormality. Spleen: No abnormality. Adrenals/Urinary Tract: There is no adrenal mass. There is subtle patchy hypoenhancement over portions of both kidneys. This can be seen with pyelonephritis as well as bolus phase related heterogeneity. There are scattered areas of capsular retraction in both kidneys consistent with interval scarring which could also contribute to  the heterogeneous enhancement. There is no masslike abnormality of either kidney. There is no urinary stone or obstruction. There is no bladder thickening. Stomach/Bowel: No dilatation or wall thickening. Surgically absent appendix. Uncomplicated sigmoid diverticulosis. Mild fecal stasis. Vascular/Lymphatic: No significant vascular findings are present. No enlarged abdominal or pelvic lymph nodes. Reproductive: The uterus is intact. Right ovary is unremarkable. There is a stable 2 cm simple appearing left ovarian cyst, Hounsfield density is 6. No follow-up imaging is recommended. Other: No acute or other significant osseous findings. Musculoskeletal: Degenerative facet hypertrophy lumbar spine greatest at L5-S1. Chronic sacroiliitis. IMPRESSION: 1. Subtle patchy hypoenhancement over portions of both kidneys. This can be seen with pyelonephritis, scarring, and bolus phase related heterogeneity. Correlate with urinalysis. 2. Interval increased patchy ground-glass disease in the posteromedial right lower lobe and infrahilar left lower lobe, most likely reflecting pneumonitis. Three-month follow-up chest CT recommended to ensure clearing. 3. Constipation and diverticulosis. 4. Stable 2 cm simple appearing left ovarian cyst. 5. Chronic sacroiliitis. Electronically Signed   By: Denman Fischer M.D.   On: 01/11/2024 00:36    Procedures Procedures    Medications Ordered in ED Medications  ondansetron  (ZOFRAN -ODT) disintegrating tablet 4 mg (4 mg Oral Given 01/10/24 1725)  iohexol  (OMNIPAQUE ) 350 MG/ML injection 75 mL (75 mLs Intravenous Contrast Given 01/11/24 0008)  predniSONE  (DELTASONE ) tablet 60 mg (60 mg Oral Given 01/11/24 0105)    ED Course/ Medical Decision Making/ A&P                                 Medical Decision Making Amount and/or Complexity of Data Reviewed Radiology: ordered and independent interpretation performed. ECG/medicine tests: ordered and independent interpretation  performed.  Risk Prescription drug management.   58 y.o. F here with left flank pain.  Some cough recently.  No fever/chills.  No hx kidney stones.  Afebrile, non-toxic in appearance.  Does have dry cough on exam but no wheezes/rhonchi.  O2 stable. Abdomen is overall non-tender.   Labs without leukocytosis or electrolyte derangement.  Normal lipase.  UA without any signs of infection.  CT obtained from triage and reviewed, patchy hypoenhancement of the kidneys, possible [place] versus scarring versus bolus contrast.  Her UA is pristine  without any noted leukocytes or bacteria.  He is not currently having any urinary symptoms.  I doubt pyelonephritis.  Does have some inflammation of the lung bases as well suggestive of pneumonitis.  Discussed results with her.  She does get flareups of her sarcoidosis every so often, feels similar today.  Will treat with course of prednisone  which she states works well for her.  She will follow-up with her pulmonologist.  Can return here for new concerns.  Final Clinical Impression(s) / ED Diagnoses Final diagnoses:  Pneumonitis    Rx / DC Orders ED Discharge Orders          Ordered    predniSONE  (DELTASONE ) 20 MG tablet        01/11/24 0108    benzonatate  (TESSALON ) 100 MG capsule  Every 8 hours        01/11/24 0108              Coretha Dew, PA-C 01/11/24 0111    Edson Graces, MD 01/11/24 [id number]

## 2024-01-11 NOTE — Discharge Instructions (Signed)
 As we discussed, CT scan did show some inflammation in the lung bases.   Take the prescribed medication as directed. Follow-up with your primary care doctor and/or your pulmonologist. Return to the ED for new or worsening symptoms.

## 2024-01-15 ENCOUNTER — Ambulatory Visit (INDEPENDENT_AMBULATORY_CARE_PROVIDER_SITE_OTHER): Payer: Self-pay | Admitting: Emergency Medicine

## 2024-01-15 ENCOUNTER — Encounter: Payer: Self-pay | Admitting: Emergency Medicine

## 2024-01-15 VITALS — BP 150/98 | HR 77 | Ht 64.0 in | Wt 196.4 lb

## 2024-01-15 DIAGNOSIS — R0789 Other chest pain: Secondary | ICD-10-CM

## 2024-01-15 MED ORDER — DOXYCYCLINE HYCLATE 100 MG PO TABS: 100.0000 mg | ORAL_TABLET | Freq: Two times a day (BID) | ORAL | 0 refills | Status: AC

## 2024-01-15 NOTE — Progress Notes (Signed)
 Subjective:    Patient ID: Hannah Ellis, female    DOB: 1966/05/13, 58 y.o.   MRN: [id number]  HPI  ROV 01/10/23 --follow-up visit 58 year old woman with sarcoidosis with pulmonary and cutaneous involvement, mild associated COPD.  She had a CT 06/2022 with some bilateral upper lobe opacities and bronchial wall thickening.  We decided to defer corticosteroids but to follow with serial imaging.  She is on Symbicort , pantoprazole , fluticasone  and loratadine .  Doing well, no flares. No albuterol  use. No cough.   CT scan of the chest/10/24 reviewed by me shows improvement in her patchy groundglass nodularity in the bilateral upper lobes compared with 06/2022 no mediastinal or hilar adenopathy  ROV 01/15/2024 --Hannah Ellis is 58 and has a history of sarcoidosis with pulmonary involvement, skin involvement, mild associated obstructive lung disease.  She was just seen in the ED with left-sided abdominal/flank pain associated with some nausea and pain with a deep inspiration.  She had an associated dry cough.  She was treated with prednisone , is still tapering.  A CT was done of her abdomen and pelvis.  She developed L flank pain first, then a few days later she had pleuritic pain. She has new hoarseness. No known sick contacts.   CT scan of the abdomen/pelvis 01/10/2024 reviewed by me, shows increased patchy groundglass infiltrate in the posterior medial right lower lobe, infrahilar left lower lobe.  Review of Systems  As per HPI     Objective:   Physical Exam Vitals:   01/15/24 1521 01/15/24 1522  BP: (!) 169/124 (!) 150/98  Pulse: 77   SpO2: 97%   Weight: 196 lb 6.4 oz (89.1 kg)   Height: 5\' 4"  (1.626 m)     Gen: Pleasant, well-nourished, in no distress,  normal affect, occasional cough  ENT: No lesions,  mouth clear,  oropharynx clear, no postnasal drip  Neck: No JVD, no stridor  Lungs: No use of accessory muscles, no crackles, no wheezes.   Cardiovascular: RRR, heart sounds  normal, no murmur or gallops, no peripheral edema  Musculoskeletal: No deformities, tender to palpation left costal margin extending down into the upper abdomen  Neuro: alert, non focal  Skin: no rash on the arms.  Some subtle erythema on forehead, dryness at eyebrows but no overt rash      Assessment & Plan:  Chest pain She is having left flank pain that is tender to palpation.  This seems superficial and inconsistent with pleuritic pain.  Her CT of the abdomen from the emergency department may have shown some slight increase groundglass opacity.  Even if this was active sarcoidosis I think will be unlikely to cause the pain that she is experiencing.  The that said the pain is pleuritic, associated with dyspnea and cough.  I think we are obliged to get a dedicated CT scan of the chest and rule out PE.  I will order a CT-PA now.  I will also treat her empirically for pneumonia given the changes on CT even though she does not have sputum production, does not have leukocytosis.  Doxycycline  ordered.  She will need quick follow-up to review the CT chest.  If her CT chest is consistent with a sarcoid flare then I will extend the treatment phase of her prednisone , even if we do not believe sarcoidosis is the cause of her chest discomfort   Time spent 42 minutes   Racheal Buddle, MD, PhD 01/15/2024, 5:12 PM [facility] [phone] or if no  answer [phone]

## 2024-01-15 NOTE — Patient Instructions (Addendum)
 Please finish your prednisone  as prescribed Take doxycycline  as prescribed until completely gone We will check a CT scan of your chest now.  Depending on that result we will decide on next steps in your treatment. Follow up with Dr Baldwin Levee next available.

## 2024-01-15 NOTE — Assessment & Plan Note (Signed)
 She is having left flank pain that is tender to palpation.  This seems superficial and inconsistent with pleuritic pain.  Her CT of the abdomen from the emergency department may have shown some slight increase groundglass opacity.  Even if this was active sarcoidosis I think will be unlikely to cause the pain that she is experiencing.  The that said the pain is pleuritic, associated with dyspnea and cough.  I think we are obliged to get a dedicated CT scan of the chest and rule out PE.  I will order a CT-PA now.  I will also treat her empirically for pneumonia given the changes on CT even though she does not have sputum production, does not have leukocytosis.  Doxycycline  ordered.  She will need quick follow-up to review the CT chest.  If her CT chest is consistent with a sarcoid flare then I will extend the treatment phase of her prednisone , even if we do not believe sarcoidosis is the cause of her chest discomfort

## 2024-01-16 ENCOUNTER — Ambulatory Visit
Admission: RE | Admit: 2024-01-16 | Discharge: 2024-01-16 | Disposition: A | Discharge status: Home / Self Care | Payer: Self-pay | Source: Ambulatory Visit | Attending: Emergency Medicine | Admitting: Emergency Medicine

## 2024-01-16 DIAGNOSIS — R0789 Other chest pain: Secondary | ICD-10-CM

## 2024-01-16 MED ORDER — IOPAMIDOL (ISOVUE-370) INJECTION 76%
500.0000 mL | Freq: Once | INTRAVENOUS | Status: AC | PRN
  Administered 2024-01-16: 82 mL via INTRAVENOUS

## 2024-01-27 ENCOUNTER — Telehealth: Payer: Self-pay

## 2024-01-27 NOTE — Telephone Encounter (Signed)
Pt is aware. Nothing further needed 

## 2024-01-27 NOTE — Telephone Encounter (Signed)
 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

## 2024-01-27 NOTE — Telephone Encounter (Signed)
 Copied from CRM [id number]. Topic: Clinical - Medical Advice >> Jan 26, 2024 12:32 PM Hilton Lucky wrote: Reason for CRM: Patient is calling to inquire about what stage her pulmonary sarcodosis is in. Please return call to patient to advise.

## 2024-01-28 ENCOUNTER — Ambulatory Visit (INDEPENDENT_AMBULATORY_CARE_PROVIDER_SITE_OTHER): Payer: Self-pay | Admitting: Emergency Medicine

## 2024-01-28 ENCOUNTER — Encounter: Payer: Self-pay | Admitting: Emergency Medicine

## 2024-01-28 VITALS — BP 148/98 | HR 80 | Ht 64.0 in | Wt 198.2 lb

## 2024-01-28 DIAGNOSIS — J4520 Mild intermittent asthma, uncomplicated: Secondary | ICD-10-CM

## 2024-01-28 DIAGNOSIS — D869 Sarcoidosis, unspecified: Secondary | ICD-10-CM

## 2024-01-28 DIAGNOSIS — R053 Chronic cough: Secondary | ICD-10-CM

## 2024-01-28 NOTE — Progress Notes (Signed)
 Subjective:    Patient ID: Hannah Ellis, female    DOB: July 19, 1966, 58 y.o.   MRN: [id number]  HPI  ROV 01/10/23 --follow-up visit 58 year old woman with sarcoidosis with pulmonary and cutaneous involvement, mild associated COPD.  She had a CT 06/2022 with some bilateral upper lobe opacities and bronchial wall thickening.  We decided to defer corticosteroids but to follow with serial imaging.  She is on Symbicort , pantoprazole , fluticasone  and loratadine .  Doing well, no flares. No albuterol  use. No cough.   CT scan of the chest/10/24 reviewed by me shows improvement in her patchy groundglass nodularity in the bilateral upper lobes compared with 06/2022 no mediastinal or hilar adenopathy  ROV 01/15/2024 --Hannah Ellis is 62 and has a history of sarcoidosis with pulmonary involvement, skin involvement, mild associated obstructive lung disease.  She was just seen in the ED with left-sided abdominal/flank pain associated with some nausea and pain with a deep inspiration.  She had an associated dry cough.  She was treated with prednisone , is still tapering.  A CT was done of her abdomen and pelvis.  She developed L flank pain first, then a few days later she had pleuritic pain. She has new hoarseness. No known sick contacts.   CT scan of the abdomen/pelvis 01/10/2024 reviewed by me, shows increased patchy groundglass infiltrate in the posterior medial right lower lobe, infrahilar left lower lobe.   ROV 01/28/2024 --follow-up visit for 58 year old woman with history of sarcoidosis with associated mild obstructive lung disease, pulmonary filtrates on CT scan of the chest, some skin involvement.  She was having some left flank and upper chest pain that was tender to palpation and felt superficial, question costochondritis.  When I saw her in April I ordered a CT-PA to rule out PE.  The CT did not show any evidence of thromboembolic disease.  Since I saw her the pain has improved, as have the cough. Still  some less freq dry cough, not every day.   CT-PA 01/16/24 reviewed by me showed no evidence of pulmonary embolism, multi lobar linear patchy densities bilaterally unchanged compared with 01/01/2023  Review of Systems  As per HPI     Objective:   Physical Exam Vitals:   01/28/24 1603 01/28/24 1604  BP: (!) 183/128 (!) 148/98  Pulse: 80   SpO2: 97%   Weight: 198 lb 3.2 oz (89.9 kg)   Height: 5\' 4"  (1.626 m)     Gen: Pleasant, well-nourished, in no distress,  normal affect, occasional cough  ENT: No lesions,  mouth clear,  oropharynx clear, no postnasal drip  Neck: No JVD, no stridor  Lungs: No use of accessory muscles, no crackles, no wheezes.   Cardiovascular: RRR, heart sounds normal, no murmur or gallops, no peripheral edema  Musculoskeletal: No deformities,  Neuro: alert, non focal  Skin: Some subtle erythema in the periorbital region and on her cheeks, around the chin      Assessment & Plan:  Asthma Improved cough.  No dyspnea.  Her CT chest did not show any evidence of active inflammatory process.  Plan to continue her Symbicort  and follow.  Chronic cough Suspect that her increased dry cough was contributing also to her flank pain, costochondritic pain.  Now improved.  I did discuss with her that she is on lisinopril  which certainly could be an underlying contributor to her cough.  If she has flaring cough again that I think we should change her to an ARB.  She wanted to hold off  on that for now.  Sarcoidosis (HCC) CT scan of the chest showed stable pulmonary infiltrates consistent with stage II sarcoid, unchanged going back 1 year.  I do not think she has an active pulmonary flare.  She does have some increased very subtle rash in the periorbital region, cheeks, chin.  Contemplated treating her with systemic steroids but will try with topical hydrocortisone first.  Finally she also has some axillary lymphadenopathy.  I will have her follow-up soon so we can assess the  rash, adenopathy and determine whether she needs a treatment dose for her sarcoid  Time spent 31 minutes   Racheal Buddle, MD, PhD 01/28/2024, 4:54 PM  [facility] [phone] or if no answer [phone]

## 2024-01-28 NOTE — Assessment & Plan Note (Signed)
 CT scan of the chest showed stable pulmonary infiltrates consistent with stage II sarcoid, unchanged going back 1 year.  I do not think she has an active pulmonary flare.  She does have some increased very subtle rash in the periorbital region, cheeks, chin.  Contemplated treating her with systemic steroids but will try with topical hydrocortisone first.  Finally she also has some axillary lymphadenopathy.  I will have her follow-up soon so we can assess the rash, adenopathy and determine whether she needs a treatment dose for her sarcoid

## 2024-01-28 NOTE — Patient Instructions (Addendum)
 I am glad that your chest discomfort and your cough are improved. We reviewed your CT scan of the chest from 01/16/2024 today. Please try using 1% hydrocortisone cream to your rash at least twice a day Continue Symbicort  2 puffs twice a day.  Rinse and gargle after using Keep albuterol  available to use 2 puffs or 1 nebulizer treatment up to every 4 hours if needed for shortness of breath, chest tightness, wheezing.  Follow in our office in about 2 months so we can assess how you are doing.  Please call sooner if you have any new problems.

## 2024-01-28 NOTE — Assessment & Plan Note (Signed)
 Suspect that her increased dry cough was contributing also to her flank pain, costochondritic pain.  Now improved.  I did discuss with her that she is on lisinopril  which certainly could be an underlying contributor to her cough.  If she has flaring cough again that I think we should change her to an ARB.  She wanted to hold off on that for now.

## 2024-01-28 NOTE — Assessment & Plan Note (Signed)
 Improved cough.  No dyspnea.  Her CT chest did not show any evidence of active inflammatory process.  Plan to continue her Symbicort  and follow.

## 2024-02-11 ENCOUNTER — Telehealth: Payer: Self-pay

## 2024-02-11 NOTE — Telephone Encounter (Signed)
 Copied from CRM [id number]. Topic: General - Other >> Feb 09, 2024  5:46 PM Hannah Ellis B wrote: Reason for CRM: Patient wants to see Dr Baldwin Levee. On her last visit, Dr Baldwin Levee told her if the lump under her arm gets bigger to call the office. Patient states lump is bigger and wants to see Dr Baldwin Levee. Please call patient to schedule appt as patient cannot wait til August.  Attempted to call pt to get some information regarding spot. No answer- left detailed message to call back.  Dr. Baldwin Levee you are booked up all the way to August. Please advise what you would like pt to do.

## 2024-02-13 NOTE — Telephone Encounter (Signed)
 Please let her know that I think it would be reasonable for us  to tet her with prednisone  for a possible flare of her sarcoidosis causing her facial rash and her enlarged lymph nodes. If she agrees, I would like for her to take prednisone  30mg  every day x 3 weeks, then decrease to 20mg  x 5 days, then to 10mg  x 5 days, then stop.  We can get her in with either RB or APP in about 1 month to see how she responds to this.

## 2024-02-17 NOTE — Telephone Encounter (Signed)
 ATC pt x1.Ldvmtcb Will wait for call back before sending rx

## 2024-05-06 ENCOUNTER — Ambulatory Visit: Payer: Self-pay | Admitting: Emergency Medicine

## 2024-05-06 ENCOUNTER — Ambulatory Visit (HOSPITAL_COMMUNITY): Admission: EM | Admit: 2024-05-06 | Discharge: 2024-05-06 | Disposition: A | Discharge status: Home / Self Care

## 2024-05-06 ENCOUNTER — Emergency Department (HOSPITAL_COMMUNITY)

## 2024-05-06 ENCOUNTER — Emergency Department (HOSPITAL_COMMUNITY)
Admission: EM | Admit: 2024-05-06 | Discharge: 2024-05-07 | Disposition: A | Discharge status: Home / Self Care | Source: Ambulatory Visit | Attending: Emergency Medicine | Admitting: Emergency Medicine

## 2024-05-06 ENCOUNTER — Encounter (HOSPITAL_COMMUNITY): Payer: Self-pay

## 2024-05-06 ENCOUNTER — Other Ambulatory Visit: Payer: Self-pay

## 2024-05-06 DIAGNOSIS — Z8673 Personal history of transient ischemic attack (TIA), and cerebral infarction without residual deficits: Secondary | ICD-10-CM | POA: Insufficient documentation

## 2024-05-06 DIAGNOSIS — R1032 Left lower quadrant pain: Secondary | ICD-10-CM | POA: Diagnosis present

## 2024-05-06 DIAGNOSIS — I10 Essential (primary) hypertension: Secondary | ICD-10-CM | POA: Diagnosis not present

## 2024-05-06 DIAGNOSIS — N83202 Unspecified ovarian cyst, left side: Secondary | ICD-10-CM | POA: Insufficient documentation

## 2024-05-06 LAB — CBC
HCT: 45.7 % (ref 36.0–46.0)
Hemoglobin: 14.3 g/dL (ref 12.0–15.0)
MCH: 26.2 pg (ref 26.0–34.0)
MCHC: 31.3 g/dL (ref 30.0–36.0)
MCV: 83.7 fL (ref 80.0–100.0)
Platelets: 168 K/uL (ref 150–400)
RBC: 5.46 MIL/uL — ABNORMAL HIGH (ref 3.87–5.11)
RDW: 15.5 % (ref 11.5–15.5)
WBC: 10.2 K/uL (ref 4.0–10.5)
nRBC: 0 % (ref 0.0–0.2)

## 2024-05-06 LAB — COMPREHENSIVE METABOLIC PANEL WITH GFR
ALT: 19 U/L (ref 0–44)
AST: 17 U/L (ref 15–41)
Albumin: 3.6 g/dL (ref 3.5–5.0)
Alkaline Phosphatase: 83 U/L (ref 38–126)
Anion gap: 8 (ref 5–15)
BUN: 15 mg/dL (ref 6–20)
CO2: 26 mmol/L (ref 22–32)
Calcium: 9.7 mg/dL (ref 8.9–10.3)
Chloride: 106 mmol/L (ref 98–111)
Creatinine, Ser: 0.99 mg/dL (ref 0.44–1.00)
GFR, Estimated: >60 mL/min (ref >60)
Glucose, Bld: 95 mg/dL (ref 70–99)
Potassium: 4.7 mmol/L (ref 3.5–5.1)
Sodium: 140 mmol/L (ref 135–145)
Total Bilirubin: 0.7 mg/dL (ref 0.0–1.2)
Total Protein: 7 g/dL (ref 6.5–8.1)

## 2024-05-06 LAB — URINALYSIS, ROUTINE W REFLEX MICROSCOPIC
Bilirubin Urine: NEGATIVE
Glucose, UA: NEGATIVE mg/dL
Hgb urine dipstick: NEGATIVE
Ketones, ur: NEGATIVE mg/dL
Leukocytes,Ua: NEGATIVE
Nitrite: NEGATIVE
Protein, ur: NEGATIVE mg/dL
Specific Gravity, Urine: 1.021 (ref 1.005–1.030)
pH: 5 (ref 5.0–8.0)

## 2024-05-06 LAB — LIPASE, BLOOD: Lipase: 36 U/L (ref 11–51)

## 2024-05-06 MED ORDER — IOHEXOL 350 MG/ML SOLN
75.0000 mL | Freq: Once | INTRAVENOUS | Status: AC | PRN
  Administered 2024-05-06: 75 mL via INTRAVENOUS

## 2024-05-06 MED ORDER — OXYCODONE-ACETAMINOPHEN 5-325 MG PO TABS
1.0000 | ORAL_TABLET | Freq: Once | ORAL | Status: AC
  Administered 2024-05-06: 1 via ORAL
  Filled 2024-05-06: qty 1

## 2024-05-06 NOTE — ED Notes (Signed)
 Pt is hypertensive, pt has hypertension and did not take her morning meds.

## 2024-05-06 NOTE — Telephone Encounter (Signed)
 FYI Only or Action Required?: FYI only for provider.  Patient is followed in Pulmonology for sarcoidosis, last seen on 01/28/2024 by Shelah Lamar RAMAN, MD.  Called Nurse Triage reporting Abdominal Pain.  Symptoms began several days ago.  Interventions attempted: Nothing.  Symptoms are: unchanged.  Triage Disposition: See HCP Within 4 Hours (Or PCP Triage)  Patient/caregiver understands and will follow disposition?: Unsure       Copied from CRM #[id number]. Topic: Clinical - Red Word Triage >> May 06, 2024  8:07 AM Corean SAUNDERS wrote: Red Word that prompted transfer to Nurse Triage: Stomach pain Reason for Disposition  [1] MILD-MODERATE pain AND [2] constant AND [3] present > 2 hours  Answer Assessment - Initial Assessment Questions 1. LOCATION: Where does it hurt?      L lower side 2. RADIATION: Does the pain shoot anywhere else? (e.g., chest, back)     no 3. ONSET: When did the pain begin? (e.g., minutes, hours or days ago)      A couple of days 4. SUDDEN: Gradual or sudden onset?     sudden 5. PATTERN Does the pain come and go, or is it constant?     Constant - affects sleeping at night 6. SEVERITY: How bad is the pain?  (e.g., Scale 1-10; mild, moderate, or severe)     8.5/10 Has not tried OTC meds - triager can appreciate pt groaning during conversation 7. RECURRENT SYMPTOM: Have you ever had this type of stomach pain before? If Yes, ask: When was the last time? and What happened that time?      I think so it's been a while - does not remember what happend 8. CAUSE: What do you think is causing the stomach pain? (e.g., gallstones, recent abdominal surgery)     Unknown, thinks r/t sarcoidosis 9. RELIEVING/AGGRAVATING FACTORS: What makes it better or worse? (e.g., antacids, bending or twisting motion, bowel movement)     No 10. OTHER SYMPTOMS: Do you have any other symptoms? (e.g., back pain, diarrhea, fever, urination pain, vomiting)        Nausea, frequent BM 3-4x a day, baseline is once a day) Denies any pulmonary sx. 11. PREGNANCY: Is there any chance you are pregnant? When was your last menstrual period?       N/a    Triager offered AV with APP, but pt declined indicating that she would not make appt in time d/t being at work and transportation limitations. Triager strongly advised further evaluation by HCP per disposition. Patient verbalized understanding and to call PCP and go to UC with worsening symptoms.  Protocols used: Abdominal Pain - Female-A-AH

## 2024-05-06 NOTE — Discharge Instructions (Signed)
 Go the ER right now to have further test which we can't do here today.

## 2024-05-06 NOTE — ED Provider Notes (Signed)
 [facility]    CSN: [id number] Arrival date & time: 05/06/24  1403      History   Chief Complaint Chief Complaint  Patient presents with   Abdominal Pain    HPI Hannah Ellis is a 58 y.o. female who present with L mid and lower quadrant pain x 2 days. Has been nauseous yesterday but did not vomit. Has not tried anything for pain.  She is up to date with colonoscopies and have been normal. Denies diarrhea, has had a few loose stools. Denies fever.    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   Depression    GERD (gastroesophageal reflux disease)    Hypertension    Pneumonia ~ 2017   Sarcoidosis, lung (HCC)    Stroke (HCC) 05/09/2015   vs TIA; mild    TIA (transient ischemic attack) 05/09/2015   vs CVA; denies residual on 02/17/2017)    Patient Active Problem List   Diagnosis Date Noted   Asthma 08/14/2022   Chronic rhinitis 08/14/2022   Costochondritis 07/18/2022   Chest pain 07/11/2022   Neck swelling 07/27/2021   Posterior right knee pain 07/27/2021   GERD (gastroesophageal reflux disease) 04/05/2021   Chronic cough 08/26/2017   Hypokalemia    Abdominal pain 02/17/2017   Pyelonephritis    Hemifacial spasm 10/29/2016   Sarcoidosis 10/15/2016   Anxiety state 07/11/2015   Essential hypertension 07/11/2015   TIA (transient ischemic attack) 05/09/2015   Uncontrolled hypertension 05/09/2015   Patient nonadherence 05/09/2015   Microcytic anemia 05/09/2015    Past Surgical History:  Procedure Laterality Date   APPENDECTOMY     CESAREAN SECTION  1998   EYE SURGERY Left ~ 1972   FOOT SURGERY Right    took some bone out of pinky and one next to it   TUBAL LIGATION  1998    OB History   No obstetric history on file.      Home Medications    Prior to Admission medications   Medication Sig Start Date End Date Taking? Authorizing Provider  albuterol  (VENTOLIN  HFA) 108  (90 Base) MCG/ACT inhaler Inhale 1-2 puffs into the lungs every 6 (six) hours as needed for wheezing or shortness of breath. 04/05/21   Shelah Lamar RAMAN, MD  amLODipine  (NORVASC ) 10 MG tablet Take 1 tablet (10 mg total) by mouth daily. 08/27/20   Mesner, Selinda, MD  aspirin  EC 325 MG EC tablet Take 1 tablet (325 mg total) by mouth daily. 05/11/15   Vann, Jessica U, DO  atorvastatin  (LIPITOR) 10 MG tablet TAKE 1 TABLET(10 MG) BY MOUTH DAILY 04/02/21   [provider]  benzonatate  (TESSALON ) 100 MG capsule Take 1 capsule (100 mg total) by mouth every 8 (eight) hours. 01/11/24   Jarold Olam HERO, PA-C  budesonide -formoterol  (SYMBICORT ) 160-4.5 MCG/ACT inhaler Inhale 2 puffs into the lungs in the morning and at bedtime. 08/01/22   Cobb, Katherine V, NP  chlorzoxazone (PARAFON) 500 MG tablet chlorzoxazone 500 mg tablet  TAKE 1 TABLET BY MOUTH UP TO FOUR TIMES DAILY AS NEEDED FOR HEADACHE 01/01/21   [provider]  diclofenac  (VOLTAREN ) 75 MG EC tablet Take 1 tablet (75 mg total) by mouth 2 (two) times daily between meals as needed. 04/23/23   Vernetta Lonni GRADE, MD  doxycycline  (VIBRA -TABS) 100 MG tablet Take 1 tablet (100 mg total) by mouth 2 (two) times daily. 01/15/24  Byrum, Robert S, MD  hydrALAZINE  (APRESOLINE ) 50 MG tablet Take 50 mg by mouth 3 (three) times daily. 09/14/21   [provider]  hydrochlorothiazide  (HYDRODIURIL ) 25 MG tablet Take 1 tablet (25 mg total) by mouth daily. 08/27/20   Mesner, Selinda, MD  ibuprofen  (ADVIL ) 600 MG tablet Take 1 tablet (600 mg total) by mouth 3 (three) times daily. 04/04/22   Lynwood Lenis, PA-C  lisinopril  (ZESTRIL ) 40 MG tablet Take 40 mg by mouth daily. 12/14/21   [provider]  metoprolol  succinate (TOPROL -XL) 25 MG 24 hr tablet Take 25 mg by mouth daily. 11/19/21   [provider]  sucralfate  (CARAFATE ) 1 GM/10ML suspension Take 10 mLs (1 g total) by mouth 4 (four) times daily -  with meals and at bedtime for 3 days.  08/06/22 08/09/22  Blaise Aleene KIDD, MD  valsartan  (DIOVAN ) 160 MG tablet Take 1 tablet (160 mg total) by mouth daily. 08/27/20   Mesner, Selinda, MD    Family History Family History  Problem Relation Age of Onset   Hypertension Mother    Colon cancer Mother    Hypertension Father    Hypertension Brother     Social History Social History   Tobacco Use   Smoking status: Never   Smokeless tobacco: Never  Vaping Use   Vaping status: Never Used  Substance Use Topics   Alcohol use: No   Drug use: No     Allergies   Patient has no known allergies.   Review of Systems Review of Systems  As noted in HPI  Physical Exam Triage Vital Signs ED Triage Vitals  Encounter Vitals Group     BP 05/06/24 1452 (!) 158/111     Girls Systolic BP Percentile --      Girls Diastolic BP Percentile --      Boys Systolic BP Percentile --      Boys Diastolic BP Percentile --      Pulse Rate 05/06/24 1452 75     Resp 05/06/24 1452 18     Temp 05/06/24 1452 97.9 F (36.6 C)     Temp Source 05/06/24 1452 Oral     SpO2 05/06/24 1452 96 %     Weight --      Height --      Head Circumference --      Peak Flow --      Pain Score 05/06/24 1454 8     Pain Loc --      Pain Education --      Exclude from Growth Chart --    No data found.  Updated Vital Signs BP (!) 158/111 (BP Location: Left Arm) Comment: states hasn't took her b/p meds today.  Pulse 75   Temp 97.9 F (36.6 C) (Oral)   Resp 18   LMP 11/07/2018 (Within Weeks)   SpO2 96%   Visual Acuity Right Eye Distance:   Left Eye Distance:   Bilateral Distance:    Right Eye Near:   Left Eye Near:    Bilateral Near:     Physical Exam Vitals and nursing note reviewed.  Constitutional:      General: She is not in acute distress.    Appearance: She is obese.     Comments: Seems in main and is moaning  HENT:     Right Ear: External ear normal.     Left Ear: External ear normal.  Eyes:     General: No scleral icterus.  Conjunctiva/sclera: Conjunctivae normal.  Pulmonary:     Effort: Pulmonary effort is normal.  Abdominal:     General: Bowel sounds are decreased. There is distension.     Palpations: Abdomen is soft.     Tenderness: There is abdominal tenderness in the left lower quadrant. There is guarding and rebound. There is no right CVA tenderness or left CVA tenderness.     Comments: Is very tender on L mid abdomen as well  Musculoskeletal:        General: Normal range of motion.  Skin:    General: Skin is warm and dry.  Neurological:     Mental Status: She is alert and oriented to person, place, and time.     Gait: Gait normal.  Psychiatric:        Mood and Affect: Mood normal.        Behavior: Behavior normal.        Thought Content: Thought content normal.        Judgment: Judgment normal.      UC Treatments / Results  Labs (all labs ordered are listed, but only abnormal results are displayed) Labs Reviewed - No data to display  EKG   Radiology No results found.  Procedures Procedures (including critical care time)  Medications Ordered in UC Medications - No data to display  Initial Impression / Assessment and Plan / UC Course  I have reviewed the triage vital signs and the nursing notes. Acute L mid and LQ pain with guarding and rebound  She was sent to ER for further testing.   Final Clinical Impressions(s) / UC Diagnoses   Final diagnoses:  Acute left lower quadrant pain     Discharge Instructions      Go the ER right now to have further test which we can't do here today.      ED Prescriptions   None    PDMP not reviewed this encounter.   Lindi Carter, PA-C 05/06/24 1515

## 2024-05-06 NOTE — ED Provider Triage Note (Signed)
 Emergency Medicine Provider Triage Evaluation Note  Hannah Ellis , a 58 y.o. female  was evaluated in triage.  Pt complains of left-sided abdominal pain for several days.  Sent by urgent care to rule out diverticulitis.  Endorses left upper to left lower quadrant abdominal pain.  Denies any diarrhea, blood in stool.  Denies any fever, chills.  Endorses some mild nausea..  Review of Systems  Positive: Abdominal pain, nausea Negative: Diarrhea, fever, chills  Physical Exam  BP (!) 174/108 (BP Location: Right Arm)   Pulse 75   Temp 97.9 F (36.6 C)   Resp 18   Ht 5' 4 (1.626 m)   Wt 90.7 kg   LMP 11/07/2018 (Within Weeks)   SpO2 98%   BMI 34.33 kg/m  Gen:   Awake, no distress   Resp:  Normal effort  MSK:   Moves extremities without difficulty  Other:  Focal ttp in LUQ to LLQ, some guarding, no rebound, rigidity  Medical Decision Making  Medically screening exam initiated at 4:23 PM.  Appropriate orders placed.  Thurza Lineman was informed that the remainder of the evaluation will be completed by another provider, this initial triage assessment does not replace that evaluation, and the importance of remaining in the ED until their evaluation is complete.  Workup initiated in triage    Rosan Sherlean DEL, [place] 05/06/24 1625

## 2024-05-06 NOTE — ED Triage Notes (Signed)
 Pt reports L sided abd pain x several days. Sent from [facility] for further testing.

## 2024-05-06 NOTE — ED Notes (Signed)
 Patient is being discharged from the Urgent Care and sent to the Emergency Department via POV . Per Kyra, Hannah Ellis, patient is in need of higher level of care due to need of further evaluation. Patient is aware and verbalizes understanding of plan of care. S Vitals:   05/06/24 1452  BP: (!) 158/111  Pulse: 75  Resp: 18  Temp: 97.9 F (36.6 C)  SpO2: 96%

## 2024-05-06 NOTE — ED Triage Notes (Signed)
 Pt c/o abdominal pain to whole lt side and lower for past few days. States pain is constant and worse laying down. C/o nausea and loose stool also. Denies taken any meds for sx's.

## 2024-05-07 ENCOUNTER — Emergency Department (HOSPITAL_COMMUNITY)

## 2024-05-07 MED ORDER — DICYCLOMINE HCL 20 MG PO TABS: 20.0000 mg | ORAL_TABLET | Freq: Three times a day (TID) | ORAL | 0 refills | Status: AC | PRN

## 2024-05-07 MED ORDER — ONDANSETRON 4 MG PO TBDP: 4.0000 mg | ORAL_TABLET | Freq: Three times a day (TID) | ORAL | 0 refills | Status: AC | PRN

## 2024-05-07 NOTE — ED Notes (Signed)
 Patient transported to Ultrasound

## 2024-05-07 NOTE — Discharge Instructions (Signed)

## 2024-05-07 NOTE — ED Provider Notes (Signed)
 Emergency Department Provider Note   I have reviewed the triage vital signs and the nursing notes.   HISTORY  Chief Complaint Abdominal Pain   HPI Hannah Ellis is a 58 y.o. female with past history reviewed below presents emergency department with 2 days of left side abdominal pain.  Patient's pain is mainly in the left mid abdomen to left lower quadrant.  She reports some associated nausea without vomiting or diarrhea.  No dysuria, hesitancy, urgency.  No vaginal bleeding or discharge.  No blood in the stool.  No fevers or chills.   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 Respiratory: Denies shortness of breath. Gastrointestinal: Positive LLQ abdominal pain. Positive nausea, no vomiting.  Skin: Negative for rash. Neurological: Negative for headaches.   ____________________________________________   PHYSICAL EXAM:  VITAL SIGNS: ED Triage Vitals  Encounter Vitals Group     BP 05/06/24 1528 (!) 187/110     Pulse Rate 05/06/24 1528 79     Resp 05/06/24 1528 14     Temp 05/06/24 1528 98.1 F (36.7 C)     Temp Source 05/06/24 1528 Oral     SpO2 05/06/24 1528 98 %     Weight 05/06/24 1554 200 lb (90.7 kg)     Height 05/06/24 1554 5' 4 (1.626 m)   Constitutional: Alert and oriented. Well appearing and in no acute distress. Eyes: Conjunctivae are normal. Head: Atraumatic. Nose: No congestion/rhinnorhea. Mouth/Throat: Mucous membranes are moist.  Oropharynx non-erythematous. Neck: No stridor.   Cardiovascular: Normal rate, regular rhythm. Good peripheral  circulation. Grossly normal heart sounds.   Respiratory: Normal respiratory effort.  No retractions. Lungs CTAB. Gastrointestinal: Soft with focal tenderness in the left mid abdomen and left lower quadrant.  No peritonitis. No distention.  Musculoskeletal: No lower extremity tenderness nor edema. No gross deformities of extremities. Neurologic:  Normal speech and language. No gross focal neurologic deficits are appreciated.  Skin:  Skin is warm, dry and intact. No rash noted.  ____________________________________________   LABS (all labs ordered are listed, but only abnormal results are displayed)  Labs Reviewed  CBC - Abnormal; Notable for the following components:      Result Value   RBC 5.46 (*)    All other components within normal limits  URINALYSIS, ROUTINE W REFLEX MICROSCOPIC - Abnormal; Notable for the following components:   APPearance HAZY (*)    All other components within normal limits  LIPASE, BLOOD  COMPREHENSIVE METABOLIC PANEL WITH GFR   ____________________________________________  EKG   EKG Interpretation Date/Time:  Thursday May 06 2024 16:03:16 EDT Ventricular Rate:  70 PR Interval:  192 QRS Duration:  124 QT Interval:  414 QTC Calculation: 447 R Axis:   -25  Text Interpretation: No significant change since last tracing Normal sinus rhythm with sinus arrhythmia Cannot rule out Anterior infarct , age undetermined ST & T wave abnormality, consider lateral ischemia Abnormal ECG No previous ECGs available Confirmed by Zackowski, Scott [phone]) on 05/06/2024 4:10:18 PM  ____________________________________________  RADIOLOGY  US  Pelvis Complete Result Date: 05/07/2024 CLINICAL DATA:  Unspecified abdominal pain EXAM: TRANSABDOMINAL ULTRASOUND OF PELVIS DOPPLER ULTRASOUND OF OVARIES TECHNIQUE: Transabdominal ultrasound examination of the pelvis was performed including evaluation of the uterus, ovaries, adnexal regions, and pelvic cul-de-sac.  Transvaginal sonography was deferred by the patient. Color and duplex Doppler ultrasound was utilized to evaluate blood flow to the ovaries. COMPARISON:  None Available. FINDINGS: Uterus Measurements: 7.5 x 3.5 x 4.9 cm = volume: 67 mL. The uterus is anteverted. There is relatively poor demarcation of the myometrial echotexture in the endometrial/myometrial junction likely related to transabdominal technique. No definite intrauterine masses are seen. The cervix is grossly unremarkable. Endometrium Not well delineated Right ovary Not clearly identified.  No adnexal mass seen. Left ovary Measurements: 3.3 x 1.8 x 3.1 cm = volume: 9 mL. 2.0 cm simple cyst identified. Pulsed Doppler evaluation of the visualized left ovary is technically limited due to the depth of the structure. There is normal color flow vascularity the structure noted, however. Other: No free fluid within pelvis. IMPRESSION: 1. Technically limited examination. 2. Nonvisualization of the right ovary. 3. 2.0 cm simple cyst within the left ovary. No follow-up imaging recommended. 4. Poor demarcation of the endometrial/myometrial junction likely related to transabdominal technique. No definite intrauterine masses are seen. Electronically Signed   By: Dorethia Molt M.D.   On: 05/07/2024 05:23   US  Art/Ven Flow Abd Pelv Doppler Result Date: 05/07/2024 CLINICAL DATA:  Unspecified abdominal pain EXAM: TRANSABDOMINAL ULTRASOUND OF PELVIS DOPPLER ULTRASOUND OF OVARIES TECHNIQUE: Transabdominal ultrasound examination of the pelvis was performed including evaluation of the uterus, ovaries, adnexal regions, and pelvic cul-de-sac. Transvaginal sonography was deferred by the patient. Color and duplex Doppler ultrasound was utilized to evaluate blood flow to the ovaries. COMPARISON:  None Available. FINDINGS: Uterus Measurements: 7.5 x 3.5 x 4.9 cm = volume: 67 mL. The uterus is anteverted. There is relatively poor demarcation of the myometrial echotexture in the  endometrial/myometrial junction likely related to transabdominal technique. No definite intrauterine masses are seen. The cervix is grossly unremarkable. Endometrium Not well delineated Right ovary Not clearly identified.  No adnexal mass seen. Left ovary Measurements: 3.3 x 1.8 x 3.1 cm = volume: 9 mL. 2.0 cm simple cyst identified. Pulsed Doppler evaluation of the visualized left ovary is technically limited due to the depth of the structure. There is normal color flow vascularity the structure noted, however. Other: No free fluid within pelvis. IMPRESSION: 1. Technically limited examination. 2. Nonvisualization of the right ovary. 3. 2.0 cm simple cyst within the left ovary. No follow-up imaging recommended. 4. Poor demarcation of the endometrial/myometrial junction likely related to transabdominal technique. No definite intrauterine masses are seen. Electronically Signed   By: Dorethia Molt M.D.   On: 05/07/2024 05:23   CT ABDOMEN PELVIS W CONTRAST Result Date: 05/06/2024 CLINICAL DATA:  Acute abdominal pain EXAM: CT ABDOMEN AND PELVIS WITH CONTRAST TECHNIQUE: Multidetector CT imaging of the abdomen and pelvis was performed using the standard protocol following bolus administration of intravenous contrast. RADIATION DOSE REDUCTION: This exam was performed according to the departmental dose-optimization program which includes automated exposure control, adjustment of the mA and/or kV according to patient size and/or use of iterative reconstruction technique. CONTRAST:  75mL OMNIPAQUE  IOHEXOL  350 MG/ML SOLN COMPARISON:  CT abdomen and pelvis 01/11/2024 FINDINGS: Lower chest: No acute abnormality. Hepatobiliary: No focal liver abnormality is seen. No gallstones, gallbladder wall thickening, or biliary dilatation. Pancreas: Unremarkable. No pancreatic ductal dilatation or  surrounding inflammatory changes. Spleen: Within normal limits. Adrenals/Urinary Tract: There are patchy areas of hypodensity throughout both  kidneys without cord contour distortion with some small areas of cortical scarring. Appearance is unchanged from the prior examination. There is no perinephric fluid collection or stranding. There is no hydronephrosis. The adrenal glands and bladder are within normal limits. Stomach/Bowel: Stomach is within normal limits. Appendix appears normal. No evidence of bowel wall thickening, distention, or inflammatory changes. The appendix is surgically absent. There is sigmoid colon diverticulosis. Vascular/Lymphatic: No significant vascular findings are present. No enlarged abdominal or pelvic lymph nodes. Reproductive: There is a stable 2 cm left ovarian cyst. The right ovary appears within normal limits. Uterine myometrium appears heterogeneous likely related to fibroid change. Other: No abdominal wall hernia or abnormality. No abdominopelvic ascites. Musculoskeletal: No acute or significant osseous findings. IMPRESSION: 1. No acute localizing process in the abdomen or pelvis. 2. Stable patchy areas of hypodensity throughout both kidneys with some small areas of cortical scarring. Findings may be related to prior infection or infarcts. 3. Stable 2 cm left ovarian cyst. No follow-up imaging recommended. 4. Fibroid uterus. 5. Sigmoid colon diverticulosis. Electronically Signed   By: Greig Pique M.D.   On: 05/06/2024 18:57    ____________________________________________   PROCEDURES  Procedure(s) performed:   Procedures  None  ____________________________________________   INITIAL IMPRESSION / ASSESSMENT AND PLAN / ED COURSE  Pertinent labs & imaging results that were available during my care of the patient were reviewed by me and considered in my medical decision making (see chart for details).   This patient is Presenting for Evaluation of abdominal pain, which does require a range of treatment options, and is a complaint that involves a high risk of morbidity and mortality.  The Differential  Diagnoses includes but is not exclusive to ovarian cyst, ovarian torsion, acute appendicitis, urinary tract infection, endometriosis, bowel obstruction, hernia, colitis, renal colic, gastroenteritis, volvulus etc.   Critical Interventions-    Medications  oxyCODONE -acetaminophen  (PERCOCET/ROXICET) 5-325 MG per tablet 1 tablet (1 tablet Oral Given 05/06/24 1636)  iohexol  (OMNIPAQUE ) 350 MG/ML injection 75 mL (75 mLs Intravenous Contrast Given 05/06/24 1754)    Reassessment after intervention:  pain improved.   Clinical Laboratory Tests Ordered, included CBC without leukocytosis.  CMP shows normal LFTs and bilirubin.  No AKI.  UA without infection.  Radiologic Tests Ordered, included CT abdomen/pelvis. I independently interpreted the images and agree with radiology interpretation.   Cardiac Monitor Tracing which shows NSR.    Social Determinants of Health Risk patient is a non-smoker.   Medical Decision Making: Summary:  The patient presents emergency department for evaluation of left lower quadrant abdominal pain starting 2 days prior.  Lab work and CT from triage show a left-sided ovarian cyst which may be symptomatic.  She does have focal tenderness which is moderate.  Plan to rule out torsion with ultrasound.  No other localizing process on CT.   Reevaluation with update and discussion with ultrasound without acute process.  No evidence of torsion on the left.  Stable for discharge with symptom management work note.  Patient's presentation is most consistent with acute presentation with potential threat to life or bodily function.   Disposition: discharge  ____________________________________________  FINAL CLINICAL IMPRESSION(S) / ED DIAGNOSES  Final diagnoses:  Left lower quadrant abdominal pain  Cyst of left ovary     NEW OUTPATIENT MEDICATIONS STARTED DURING THIS VISIT:  Discharge Medication List as of 05/07/2024  6:13 AM  START taking these medications   Details   dicyclomine  (BENTYL ) 20 MG tablet Take 1 tablet (20 mg total) by mouth 3 (three) times daily as needed for spasms., Starting Fri 05/07/2024, Normal    ondansetron  (ZOFRAN -ODT) 4 MG disintegrating tablet Take 1 tablet (4 mg total) by mouth every 8 (eight) hours as needed., Starting Fri 05/07/2024, Normal        Note:  This document was prepared using Dragon voice recognition software and may include unintentional dictation errors.  Fonda Law, MD, [facility] Emergency Medicine    Kilah Drahos, Fonda MATSU, MD 05/07/24 [phone]

## 2024-05-17 ENCOUNTER — Other Ambulatory Visit: Payer: Self-pay

## 2024-05-17 ENCOUNTER — Encounter (HOSPITAL_COMMUNITY): Payer: Self-pay

## 2024-05-17 ENCOUNTER — Emergency Department (HOSPITAL_COMMUNITY)
Admission: EM | Admit: 2024-05-17 | Discharge: 2024-05-17 | Disposition: A | Discharge status: Home / Self Care | Attending: Emergency Medicine | Admitting: Emergency Medicine

## 2024-05-17 DIAGNOSIS — R109 Unspecified abdominal pain: Secondary | ICD-10-CM

## 2024-05-17 DIAGNOSIS — R1012 Left upper quadrant pain: Secondary | ICD-10-CM | POA: Insufficient documentation

## 2024-05-17 DIAGNOSIS — Z7982 Long term (current) use of aspirin: Secondary | ICD-10-CM | POA: Diagnosis not present

## 2024-05-17 LAB — URINALYSIS, ROUTINE W REFLEX MICROSCOPIC
Bacteria, UA: NONE SEEN
Bilirubin Urine: NEGATIVE
Glucose, UA: NEGATIVE mg/dL
Hgb urine dipstick: NEGATIVE
Ketones, ur: NEGATIVE mg/dL
Nitrite: NEGATIVE
Protein, ur: 30 mg/dL — AB
Specific Gravity, Urine: 1.021 (ref 1.005–1.030)
pH: 5 (ref 5.0–8.0)

## 2024-05-17 LAB — COMPREHENSIVE METABOLIC PANEL WITH GFR
ALT: 22 U/L (ref 0–44)
AST: 22 U/L (ref 15–41)
Albumin: 3.9 g/dL (ref 3.5–5.0)
Alkaline Phosphatase: 86 U/L (ref 38–126)
Anion gap: 7 (ref 5–15)
BUN: 14 mg/dL (ref 6–20)
CO2: 26 mmol/L (ref 22–32)
Calcium: 9.7 mg/dL (ref 8.9–10.3)
Chloride: 104 mmol/L (ref 98–111)
Creatinine, Ser: 1.18 mg/dL — ABNORMAL HIGH (ref 0.44–1.00)
GFR, Estimated: 54 mL/min — ABNORMAL LOW (ref >60)
Glucose, Bld: 98 mg/dL (ref 70–99)
Potassium: 4 mmol/L (ref 3.5–5.1)
Sodium: 137 mmol/L (ref 135–145)
Total Bilirubin: 0.6 mg/dL (ref 0.0–1.2)
Total Protein: 7.3 g/dL (ref 6.5–8.1)

## 2024-05-17 LAB — LIPASE, BLOOD: Lipase: 31 U/L (ref 11–51)

## 2024-05-17 LAB — CBC
HCT: 45.6 % (ref 36.0–46.0)
Hemoglobin: 14.4 g/dL (ref 12.0–15.0)
MCH: 26.5 pg (ref 26.0–34.0)
MCHC: 31.6 g/dL (ref 30.0–36.0)
MCV: 84 fL (ref 80.0–100.0)
Platelets: 173 K/uL (ref 150–400)
RBC: 5.43 MIL/uL — ABNORMAL HIGH (ref 3.87–5.11)
RDW: 15.4 % (ref 11.5–15.5)
WBC: 9.2 K/uL (ref 4.0–10.5)
nRBC: 0 % (ref 0.0–0.2)

## 2024-05-17 MED ORDER — LIDOCAINE 5 % EX PTCH: 1.0000 | MEDICATED_PATCH | CUTANEOUS | 0 refills | Status: AC

## 2024-05-17 MED ORDER — LIDOCAINE 5 % EX PTCH
1.0000 | MEDICATED_PATCH | CUTANEOUS | Status: DC
  Administered 2024-05-17: 1 via TRANSDERMAL
  Filled 2024-05-17: qty 1

## 2024-05-17 MED ORDER — KETOROLAC TROMETHAMINE 30 MG/ML IJ SOLN
30.0000 mg | Freq: Once | INTRAMUSCULAR | Status: AC
  Administered 2024-05-17: 30 mg via INTRAMUSCULAR
  Filled 2024-05-17: qty 1

## 2024-05-17 MED ORDER — IBUPROFEN 600 MG PO TABS: 600.0000 mg | ORAL_TABLET | Freq: Three times a day (TID) | ORAL | 0 refills | Status: AC

## 2024-05-17 MED ORDER — METHOCARBAMOL 500 MG PO TABS: 500.0000 mg | ORAL_TABLET | Freq: Two times a day (BID) | ORAL | 0 refills | Status: AC

## 2024-05-17 NOTE — Discharge Instructions (Addendum)
 Return here for fever, vomiting that is persistent, severe worsening pain.  Follow-up with your doctor this week if not better

## 2024-05-17 NOTE — ED Triage Notes (Signed)
 Pt c/o left side abdominal pain for the past 3 weeks. Pt was diagnosed with a cyst at [facility]. Pt was seen by her doctor for it as well. Pain is getting worse.

## 2024-05-17 NOTE — ED Provider Notes (Signed)
 [place] EMERGENCY DEPARTMENT AT [facility] Provider Note   CSN: [id number] Arrival date & time: 05/17/24  1435     Patient presents with: Abdominal Pain   Hannah Ellis is a 58 y.o. female.   58 year old female presents with 3 weeks of left upper anterior abdominal wall pain.  States that the pain has been constant and worse when she pushes on it.  Has had no persistent nausea or vomiting.  No fever or chills.  No vaginal bleeding.  No urinary symptoms.  Was seen in the ER for similar symptoms about 10 days ago and that visit was reviewed.  Patient had an abdominal CT as well as a pelvic ultrasound which did show a 2 cm left ovarian cyst.  Otherwise no other findings.  Patient saw her PCP who prescribed medications for her which she states did not help.  She denies any rashes to the area       Prior to Admission medications   Medication Sig Start Date End Date Taking? Authorizing Provider  albuterol  (VENTOLIN  HFA) 108 (90 Base) MCG/ACT inhaler Inhale 1-2 puffs into the lungs every 6 (six) hours as needed for wheezing or shortness of breath. 04/05/21   Byrum, Robert S, MD  amLODipine  (NORVASC ) 10 MG tablet Take 1 tablet (10 mg total) by mouth daily. 08/27/20   Mesner, Selinda, MD  aspirin  EC 325 MG EC tablet Take 1 tablet (325 mg total) by mouth daily. 05/11/15   Vann, Jessica U, DO  atorvastatin  (LIPITOR) 10 MG tablet TAKE 1 TABLET(10 MG) BY MOUTH DAILY 04/02/21   [provider]  benzonatate  (TESSALON ) 100 MG capsule Take 1 capsule (100 mg total) by mouth every 8 (eight) hours. 01/11/24   Jarold Olam HERO, PA-C  budesonide -formoterol  (SYMBICORT ) 160-4.5 MCG/ACT inhaler Inhale 2 puffs into the lungs in the morning and at bedtime. 08/01/22   Cobb, Katherine V, NP  chlorzoxazone (PARAFON) 500 MG tablet chlorzoxazone 500 mg tablet  TAKE 1 TABLET BY MOUTH UP TO FOUR TIMES DAILY AS NEEDED FOR HEADACHE 01/01/21   [provider]  diclofenac  (VOLTAREN ) 75 MG EC tablet  Take 1 tablet (75 mg total) by mouth 2 (two) times daily between meals as needed. 04/23/23   Vernetta Lonni GRADE, MD  dicyclomine  (BENTYL ) 20 MG tablet Take 1 tablet (20 mg total) by mouth 3 (three) times daily as needed for spasms. 05/07/24   Long, Fonda MATSU, MD  doxycycline  (VIBRA -TABS) 100 MG tablet Take 1 tablet (100 mg total) by mouth 2 (two) times daily. 01/15/24   Shelah Lamar RAMAN, MD  hydrALAZINE  (APRESOLINE ) 50 MG tablet Take 50 mg by mouth 3 (three) times daily. 09/14/21   [provider]  hydrochlorothiazide  (HYDRODIURIL ) 25 MG tablet Take 1 tablet (25 mg total) by mouth daily. 08/27/20   Mesner, Selinda, MD  ibuprofen  (ADVIL ) 600 MG tablet Take 1 tablet (600 mg total) by mouth 3 (three) times daily. 04/04/22   Lynwood Lenis, PA-C  lisinopril  (ZESTRIL ) 40 MG tablet Take 40 mg by mouth daily. 12/14/21   [provider]  metoprolol  succinate (TOPROL -XL) 25 MG 24 hr tablet Take 25 mg by mouth daily. 11/19/21   [provider]  ondansetron  (ZOFRAN -ODT) 4 MG disintegrating tablet Take 1 tablet (4 mg total) by mouth every 8 (eight) hours as needed. 05/07/24   Long, Fonda MATSU, MD  sucralfate  (CARAFATE ) 1 GM/10ML suspension Take 10 mLs (1 g total) by mouth 4 (four) times daily -  with meals and at bedtime  for 3 days. 08/06/22 08/09/22  Blaise Aleene KIDD, MD  valsartan  (DIOVAN ) 160 MG tablet Take 1 tablet (160 mg total) by mouth daily. 08/27/20   Mesner, Selinda, MD    Allergies: Patient has no known allergies.    Review of Systems  All other systems reviewed and are negative.   Updated Vital Signs BP (!) 177/112 (BP Location: Right Arm)   Pulse 88   Temp 98.4 F (36.9 C) (Oral)   Resp 18   LMP 11/07/2018 (Within Weeks)   SpO2 97%   Physical Exam Vitals and nursing note reviewed.  Constitutional:      General: She is not in acute distress.    Appearance: Normal appearance. She is well-developed. She is not toxic-appearing.  HENT:     Head: Normocephalic and  atraumatic.  Eyes:     General: Lids are normal.     Conjunctiva/sclera: Conjunctivae normal.     Pupils: Pupils are equal, round, and reactive to light.  Neck:     Thyroid : No thyroid  mass.     Trachea: No tracheal deviation.  Cardiovascular:     Rate and Rhythm: Normal rate and regular rhythm.     Heart sounds: Normal heart sounds. No murmur heard.    No gallop.  Pulmonary:     Effort: Pulmonary effort is normal. No respiratory distress.     Breath sounds: Normal breath sounds. No stridor. No decreased breath sounds, wheezing, rhonchi or rales.  Abdominal:     General: There is no distension.     Palpations: Abdomen is soft.     Tenderness: There is no abdominal tenderness. There is no rebound.      Comments: No lower pelvic pain to palpation particularly noted in the left lower quadrant  Musculoskeletal:        General: No tenderness. Normal range of motion.     Cervical back: Normal range of motion and neck supple.  Skin:    General: Skin is warm and dry.     Findings: No abrasion or rash.  Neurological:     Mental Status: She is alert and oriented to person, place, and time. Mental status is at baseline.     GCS: GCS eye subscore is 4. GCS verbal subscore is 5. GCS motor subscore is 6.     Cranial Nerves: No cranial nerve deficit.     Sensory: No sensory deficit.     Motor: Motor function is intact.  Psychiatric:        Attention and Perception: Attention normal.        Speech: Speech normal.        Behavior: Behavior normal.     (all labs ordered are listed, but only abnormal results are displayed) Labs Reviewed  COMPREHENSIVE METABOLIC PANEL WITH GFR - Abnormal; Notable for the following components:      Result Value   Creatinine, Ser 1.18 (*)    GFR, Estimated 54 (*)    All other components within normal limits  CBC - Abnormal; Notable for the following components:   RBC 5.43 (*)    All other components within normal limits  LIPASE, BLOOD  URINALYSIS,  ROUTINE W REFLEX MICROSCOPIC    EKG: None  Radiology: No results found.   Procedures   Medications Ordered in the ED - No data to display  Medical Decision Making Amount and/or Complexity of Data Reviewed Labs: ordered.   Patient's labs here are overall reassuring.  Pain appears to be in patient's left anterior abdominal wall.  She has been afebrile.  Has had no recent symptomatic symptoms.  Do not feel that she needs further abdominal imaging at this time.  Pain seems to be almost in a dermatomal distribution.  Will prescribe patient pain Lidoderm  patch, anti-inflammatories, muscle relaxants.  Return precautions given     Final diagnoses:  None    ED Discharge Orders     None          Dasie Faden, MD 05/17/24 [phone]

## 2024-06-09 ENCOUNTER — Encounter: Payer: Self-pay | Admitting: Emergency Medicine

## 2024-06-09 ENCOUNTER — Ambulatory Visit: Payer: Self-pay | Admitting: Emergency Medicine

## 2024-06-09 VITALS — BP 122/72 | HR 68 | Temp 98.5°F | Ht 64.0 in | Wt 196.8 lb

## 2024-06-09 DIAGNOSIS — J4520 Mild intermittent asthma, uncomplicated: Secondary | ICD-10-CM

## 2024-06-09 DIAGNOSIS — D869 Sarcoidosis, unspecified: Secondary | ICD-10-CM

## 2024-06-09 NOTE — Progress Notes (Signed)
   Subjective:    Patient ID: Hannah Ellis, female    DOB: 05-15-1966, 58 y.o.   MRN: [id number]  Asthma Her past medical history is significant for asthma.    ROV 01/28/2024 --follow-up visit for 58 year old woman with history of sarcoidosis with associated mild obstructive lung disease, pulmonary filtrates on CT scan of the chest, some skin involvement.  She was having some left flank and upper chest pain that was tender to palpation and felt superficial, question costochondritis.  When I saw her in April I ordered a CT-PA to rule out PE.  The CT did not show any evidence of thromboembolic disease.  Since I saw her the pain has improved, as have the cough. Still some less freq dry cough, not every day.   CT-PA 01/16/24 reviewed by me showed no evidence of pulmonary embolism, multi lobar linear patchy densities bilaterally unchanged compared with 01/01/2023  ROV 06/09/2024 --Ms. Mccuistion is 2 with history of sarcoidosis and associated mild obstructive lung disease.  She has some skin involvement as well as patchy densities on CT scan of the chest that have been stable on serial imaging. Recently seen for left lower quadrant pain and abdominal wall pain in the ED in August She has not had any breathing issues or CP. She is off symbicort , never needs any albuterol . No cough.   Review of Systems  As per HPI     Objective:   Physical Exam Vitals:   06/09/24 1517  BP: 122/72  Pulse: 68  Temp: 98.5 F (36.9 C)  SpO2: 99%  Weight: 196 lb 12.8 oz (89.3 kg)  Height: 5' 4 (1.626 m)    Gen: Pleasant, well-nourished, in no distress,  normal affect, occasional cough  ENT: No lesions,  mouth clear,  oropharynx clear, no postnasal drip  Neck: No JVD, no stridor  Lungs: No use of accessory muscles, no crackles, no wheezes.   Cardiovascular: RRR, heart sounds normal, no murmur or gallops, no peripheral edema  Musculoskeletal: No deformities,  Neuro: alert, non focal  Skin: Some subtle  erythema in the periorbital region and on her cheeks, around the chin      Assessment & Plan:  Sarcoidosis (HCC) Overall clinically stable.  Her most recent CT chest was stable as well with some bilateral residual infiltrates.  Good functional capacity, no respiratory symptoms currently.  No rash.  She knows to follow with ophthalmology annually.  I recommended the flu shot.  She needs a repeat CT scan of her chest to follow her infiltrates in April 2026.  We could consider doing this sooner if she has any clinical change.  Asthma She was able to come off Symbicort .  Rarely uses albuterol .  I think we can hold off on starting ICS/LABA for now.  If there is a clinical decline then would reconsider.      Lamar Chris, MD, PhD 06/09/2024, 3:37 PM [facility] [phone] or if no answer [phone]

## 2024-06-09 NOTE — Patient Instructions (Signed)
 We will hold off on restarting a scheduled inhaler medication for now. Keep your albuterol  available to use 2 puffs if needed for shortness of breath, chest tightness, wheezing. Get the flu shot this fall We will plan to repeat your CT scan of the chest in April 2026 Please notify us  if you develop any new respiratory symptoms.  If so then we might decide to repeat your CT scan sooner. Follow Dr. Shelah in April 2026 to review your scan, sooner if you have any problems.

## 2024-06-09 NOTE — Assessment & Plan Note (Signed)
 Overall clinically stable.  Her most recent CT chest was stable as well with some bilateral residual infiltrates.  Good functional capacity, no respiratory symptoms currently.  No rash.  She knows to follow with ophthalmology annually.  I recommended the flu shot.  She needs a repeat CT scan of her chest to follow her infiltrates in April 2026.  We could consider doing this sooner if she has any clinical change.

## 2024-06-09 NOTE — Assessment & Plan Note (Signed)
 She was able to come off Symbicort .  Rarely uses albuterol .  I think we can hold off on starting ICS/LABA for now.  If there is a clinical decline then would reconsider.

## 2024-07-09 ENCOUNTER — Other Ambulatory Visit (HOSPITAL_BASED_OUTPATIENT_CLINIC_OR_DEPARTMENT_OTHER): Payer: Self-pay | Admitting: Physician Assistant

## 2024-07-09 DIAGNOSIS — R1012 Left upper quadrant pain: Secondary | ICD-10-CM

## 2024-07-11 ENCOUNTER — Ambulatory Visit (HOSPITAL_BASED_OUTPATIENT_CLINIC_OR_DEPARTMENT_OTHER)
Admission: RE | Admit: 2024-07-11 | Discharge: 2024-07-11 | Attending: Physician Assistant | Admitting: Physician Assistant

## 2024-07-11 DIAGNOSIS — R1012 Left upper quadrant pain: Secondary | ICD-10-CM | POA: Insufficient documentation

## 2024-07-11 LAB — POCT I-STAT CREATININE: Creatinine, Ser: 1.1 mg/dL — ABNORMAL HIGH (ref 0.44–1.00)

## 2024-07-11 MED ORDER — IOHEXOL 300 MG/ML  SOLN
100.0000 mL | Freq: Once | INTRAMUSCULAR | Status: AC | PRN
  Administered 2024-07-11: 100 mL via INTRAVENOUS
